# Patient Record
Sex: Male | Born: 1965
Health system: Southern US, Community
[De-identification: ages and names within clinical notes are randomized; demographics above are authoritative.]

## PROBLEM LIST (undated history)

## (undated) DIAGNOSIS — M2550 Pain in unspecified joint: Secondary | ICD-10-CM

## (undated) DIAGNOSIS — Z5189 Encounter for other specified aftercare: Secondary | ICD-10-CM

## (undated) DIAGNOSIS — M109 Gout, unspecified: Secondary | ICD-10-CM

## (undated) DIAGNOSIS — H409 Unspecified glaucoma: Secondary | ICD-10-CM

## (undated) DIAGNOSIS — F17201 Nicotine dependence, unspecified, in remission: Secondary | ICD-10-CM

## (undated) DIAGNOSIS — K449 Diaphragmatic hernia without obstruction or gangrene: Secondary | ICD-10-CM

## (undated) DIAGNOSIS — G8929 Other chronic pain: Secondary | ICD-10-CM

## (undated) DIAGNOSIS — F1011 Alcohol abuse, in remission: Secondary | ICD-10-CM

## (undated) DIAGNOSIS — K284 Chronic or unspecified gastrojejunal ulcer with hemorrhage: Secondary | ICD-10-CM

## (undated) DIAGNOSIS — M1990 Unspecified osteoarthritis, unspecified site: Secondary | ICD-10-CM

## (undated) DIAGNOSIS — K274 Chronic or unspecified peptic ulcer, site unspecified, with hemorrhage: Secondary | ICD-10-CM

## (undated) DIAGNOSIS — S83209A Unspecified tear of unspecified meniscus, current injury, unspecified knee, initial encounter: Secondary | ICD-10-CM

## (undated) DIAGNOSIS — I10 Essential (primary) hypertension: Secondary | ICD-10-CM

## (undated) HISTORY — PX: HERNIA REPAIR: SHX51

## (undated) HISTORY — DX: Diaphragmatic hernia without obstruction or gangrene: K44.9

## (undated) HISTORY — DX: Unspecified osteoarthritis, unspecified site: M19.90

## (undated) HISTORY — DX: Chronic or unspecified peptic ulcer, site unspecified, with hemorrhage: K27.4

## (undated) HISTORY — DX: Encounter for other specified aftercare: Z51.89

## (undated) HISTORY — DX: Nicotine dependence, unspecified, in remission: F17.201

## (undated) HISTORY — DX: Chronic or unspecified gastrojejunal ulcer with hemorrhage: K28.4

## (undated) HISTORY — DX: Unspecified glaucoma: H40.9

## (undated) HISTORY — DX: Gout, unspecified: M10.9

## (undated) HISTORY — DX: Alcohol abuse, in remission: F10.11

## (undated) HISTORY — PX: APPENDECTOMY: SHX54

## (undated) HISTORY — DX: Unspecified tear of unspecified meniscus, current injury, unspecified knee, initial encounter: S83.209A

---

## 2009-01-19 ENCOUNTER — Ambulatory Visit: Payer: Self-pay | Admitting: Family Medicine

## 2009-01-19 DIAGNOSIS — R5383 Other fatigue: Secondary | ICD-10-CM

## 2009-01-19 DIAGNOSIS — R5381 Other malaise: Secondary | ICD-10-CM | POA: Insufficient documentation

## 2009-01-19 DIAGNOSIS — R1310 Dysphagia, unspecified: Secondary | ICD-10-CM | POA: Insufficient documentation

## 2009-01-19 DIAGNOSIS — M109 Gout, unspecified: Secondary | ICD-10-CM | POA: Insufficient documentation

## 2009-01-19 LAB — CONVERTED CEMR LAB
AST: 32 units/L (ref 0–37)
Albumin: 4.2 g/dL (ref 3.5–5.2)
BUN: 16 mg/dL (ref 6–23)
Basophils Absolute: 0 10*3/uL (ref 0.0–0.1)
Bilirubin, Direct: 0.1 mg/dL (ref 0.0–0.3)
Calcium: 9.4 mg/dL (ref 8.4–10.5)
Chloride: 96 meq/L (ref 96–112)
Cholesterol, target level: 200 mg/dL
Creatinine, Ser: 1.1 mg/dL (ref 0.4–1.5)
Direct LDL: 145.2 mg/dL
Eosinophils Absolute: 0.1 10*3/uL (ref 0.0–0.7)
Ferritin: 56.4 ng/mL (ref 22.0–322.0)
Glucose, Bld: 98 mg/dL (ref 70–99)
HDL goal, serum: 40 mg/dL
Hemoglobin: 15.5 g/dL (ref 13.0–17.0)
LDL Goal: 130 mg/dL
MCHC: 35.6 g/dL (ref 30.0–36.0)
MCV: 93.7 fL (ref 78.0–100.0)
PSA: 1.83 ng/mL (ref 0.10–4.00)
RBC: 4.66 M/uL (ref 4.22–5.81)
Sodium: 141 meq/L (ref 135–145)
Total CHOL/HDL Ratio: 5.6
VLDL: 32 mg/dL (ref 0–40)
WBC: 9.5 10*3/uL (ref 4.5–10.5)

## 2009-01-20 ENCOUNTER — Encounter (INDEPENDENT_AMBULATORY_CARE_PROVIDER_SITE_OTHER): Payer: Self-pay | Admitting: *Deleted

## 2009-02-03 ENCOUNTER — Ambulatory Visit: Payer: Self-pay | Admitting: Gastroenterology

## 2009-02-24 ENCOUNTER — Ambulatory Visit: Payer: Self-pay | Admitting: Gastroenterology

## 2009-02-24 ENCOUNTER — Encounter: Payer: Self-pay | Admitting: Gastroenterology

## 2009-02-25 ENCOUNTER — Encounter: Payer: Self-pay | Admitting: Gastroenterology

## 2009-03-08 ENCOUNTER — Telehealth: Payer: Self-pay | Admitting: Gastroenterology

## 2009-04-12 ENCOUNTER — Ambulatory Visit: Payer: Self-pay | Admitting: Gastroenterology

## 2010-02-08 ENCOUNTER — Emergency Department (HOSPITAL_COMMUNITY): Admission: EM | Admit: 2010-02-08 | Discharge: 2010-02-08 | Payer: Self-pay | Admitting: Emergency Medicine

## 2010-02-08 ENCOUNTER — Emergency Department (HOSPITAL_COMMUNITY): Admission: EM | Admit: 2010-02-08 | Discharge: 2010-02-08 | Payer: Self-pay | Admitting: Family Medicine

## 2011-04-19 ENCOUNTER — Inpatient Hospital Stay (INDEPENDENT_AMBULATORY_CARE_PROVIDER_SITE_OTHER)
Admission: RE | Admit: 2011-04-19 | Discharge: 2011-04-19 | Disposition: A | Discharge status: Home / Self Care | Payer: 59 | Source: Ambulatory Visit | Attending: Family Medicine | Admitting: Family Medicine

## 2011-04-19 DIAGNOSIS — K612 Anorectal abscess: Secondary | ICD-10-CM

## 2011-04-22 LAB — CULTURE, ROUTINE-ABSCESS: Gram Stain: NONE SEEN

## 2012-01-11 ENCOUNTER — Encounter: Payer: Self-pay | Admitting: Family Medicine

## 2012-01-12 ENCOUNTER — Ambulatory Visit (INDEPENDENT_AMBULATORY_CARE_PROVIDER_SITE_OTHER): Payer: 59 | Admitting: Family Medicine

## 2012-01-12 ENCOUNTER — Encounter: Payer: Self-pay | Admitting: Family Medicine

## 2012-01-12 VITALS — BP 126/82 | HR 84 | Temp 98.4°F | Ht 70.0 in | Wt 264.2 lb

## 2012-01-12 DIAGNOSIS — M109 Gout, unspecified: Secondary | ICD-10-CM

## 2012-01-12 LAB — CBC WITH DIFFERENTIAL/PLATELET
Basophils Relative: 0.2 % (ref 0.0–3.0)
Eosinophils Absolute: 0.1 10*3/uL (ref 0.0–0.7)
HCT: 41.6 % (ref 39.0–52.0)
Hemoglobin: 14.3 g/dL (ref 13.0–17.0)
Lymphocytes Relative: 16.5 % (ref 12.0–46.0)
MCV: 91.3 fl (ref 78.0–100.0)
Monocytes Absolute: 0.9 10*3/uL (ref 0.1–1.0)
Platelets: 243 10*3/uL (ref 150.0–400.0)
RDW: 14.2 % (ref 11.5–14.6)

## 2012-01-12 LAB — BASIC METABOLIC PANEL
BUN: 13 mg/dL (ref 6–23)
Creatinine, Ser: 1.1 mg/dL (ref 0.4–1.5)
Sodium: 138 mEq/L (ref 135–145)

## 2012-01-12 MED ORDER — PREDNISONE 20 MG PO TABS: 20.0000 mg | ORAL_TABLET | Freq: Every day | ORAL | Status: DC

## 2012-01-12 MED ORDER — ALLOPURINOL 100 MG PO TABS: 100.0000 mg | ORAL_TABLET | Freq: Every day | ORAL | Status: DC

## 2012-01-12 MED ORDER — INDOMETHACIN 50 MG PO CAPS: 50.0000 mg | ORAL_CAPSULE | Freq: Three times a day (TID) | ORAL | Status: DC | PRN

## 2012-01-12 NOTE — Assessment & Plan Note (Signed)
Exam and story consistent with podagra Given h/o recurrent gout flares, start allopurinol at 100mg , return 1-2 mo for f/u with PCP. Check blood work today. Provided with gout handout.

## 2012-01-12 NOTE — Patient Instructions (Signed)
Looks like gout flare. Take prednisone 2 tablets daily for 5 days then start indocin as needed for gout pain (take daily for 1-2 weeks). Allopurinol 100mg  daily. Let us know if not improving or any worsening.  Gout Gout is an inflammatory condition (arthritis) caused by a buildup of uric acid crystals in the joints. Uric acid is a chemical that is normally present in the blood. Under some circumstances, uric acid can form into crystals in your joints. This causes joint redness, soreness, and swelling (inflammation). Repeat attacks are common. Over time, uric acid crystals can form into masses (tophi) near a joint, causing disfigurement. Gout is treatable and often preventable. CAUSES  The disease begins with elevated levels of uric acid in the blood. Uric acid is produced by your body when it breaks down a naturally found substance called purines. This also happens when you eat certain foods such as meats and fish. Causes of an elevated uric acid level include:  Being passed down from parent to child (heredity).   Diseases that cause increased uric acid production (obesity, psoriasis, some cancers).   Excessive alcohol use.   Diet, especially diets rich in meat and seafood.   Medicines, including certain cancer-fighting drugs (chemotherapy), diuretics, and aspirin.   Chronic kidney disease. The kidneys are no longer able to remove uric acid well.   Problems with metabolism.  Conditions strongly associated with gout include:  Obesity.   High blood pressure.   High cholesterol.   Diabetes.  Not everyone with elevated uric acid levels gets gout. It is not understood why some people get gout and others do not. Surgery, joint injury, and eating too much of certain foods are some of the factors that can lead to gout. SYMPTOMS   An attack of gout comes on quickly. It causes intense pain with redness, swelling, and warmth in a joint.   Fever can occur.   Often, only one joint is  involved. Certain joints are more commonly involved:   Base of the big toe.   Knee.   Ankle.   Wrist.   Finger.  Without treatment, an attack usually goes away in a few days to weeks. Between attacks, you usually will not have symptoms, which is different from many other forms of arthritis. DIAGNOSIS  Your caregiver will suspect gout based on your symptoms and exam. Removal of fluid from the joint (arthrocentesis) is done to check for uric acid crystals. Your caregiver will give you a medicine that numbs the area (local anesthetic) and use a needle to remove joint fluid for exam. Gout is confirmed when uric acid crystals are seen in joint fluid, using a special microscope. Sometimes, blood, urine, and X-ray tests are also used. TREATMENT  There are 2 phases to gout treatment: treating the sudden onset (acute) attack and preventing attacks (prophylaxis). Treatment of an Acute Attack  Medicines are used. These include anti-inflammatory medicines or steroid medicines.   An injection of steroid medicine into the affected joint is sometimes necessary.   The painful joint is rested. Movement can worsen the arthritis.   You may use warm or cold treatments on painful joints, depending which works best for you.   Discuss the use of coffee, vitamin C, or cherries with your caregiver. These may be helpful treatment options.  Treatment to Prevent Attacks After the acute attack subsides, your caregiver may advise prophylactic medicine. These medicines either help your kidneys eliminate uric acid from your body or decrease your uric acid production. You  may need to stay on these medicines for a very long time. The early phase of treatment with prophylactic medicine can be associated with an increase in acute gout attacks. For this reason, during the first few months of treatment, your caregiver may also advise you to take medicines usually used for acute gout treatment. Be sure you understand your  caregiver's directions. You should also discuss dietary treatment with your caregiver. Certain foods such as meats and fish can increase uric acid levels. Other foods such as dairy can decrease levels. Your caregiver can give you a list of foods to avoid. HOME CARE INSTRUCTIONS   Do not take aspirin to relieve pain. This raises uric acid levels.   Only take over-the-counter or prescription medicines for pain, discomfort, or fever as directed by your caregiver.   Rest the joint as much as possible. When in bed, keep sheets and blankets off painful areas.   Keep the affected joint raised (elevated).   Use crutches if the painful joint is in your leg.   Drink enough water and fluids to keep your urine clear or pale yellow. This helps your body get rid of uric acid. Do not drink alcoholic beverages. They slow the passage of uric acid.   Follow your caregiver's dietary instructions. Pay careful attention to the amount of protein you eat. Your daily diet should emphasize fruits, vegetables, whole grains, and fat-free or low-fat milk products.   Maintain a healthy body weight.  SEEK MEDICAL CARE IF:   You have an oral temperature above 102 F (38.9 C).   You develop diarrhea, vomiting, or any side effects from medicines.   You do not feel better in 24 hours, or you are getting worse.  SEEK IMMEDIATE MEDICAL CARE IF:   Your joint becomes suddenly more tender and you have:   Chills.   An oral temperature above 102 F (38.9 C), not controlled by medicine.  MAKE SURE YOU:   Understand these instructions.   Will watch your condition.   Will get help right away if you are not doing well or get worse.  Document Released: 11/17/2000 Document Revised: 08/02/2011 Document Reviewed: 02/28/2010 [employer] Patient Information 2012 [place], [place].

## 2012-01-12 NOTE — Progress Notes (Signed)
  Subjective:    Patient ID: Randy Combs, male    DOB: October 22, 1966, 46 y.o.   MRN: [id number]  HPI CC: gout flare  45yo with h/o gout presents with 2d h/o podagra.  Tends to control with indocin daily but has been out of indocin.  Last flare was several months ago.  Tends to get monthly but smaller flares.  Never on gout lowering meds.  No results found for this basename: URICACID   This current flare started 2 days ago, swollen medial foot and pain moving into ankle.  + redness, swelling, very tender to touch.  Last night was worse night.  No recent bug bites.  No fevers/chills, nausea.  Review of Systems Per HPI    Objective:   Physical Exam  Nursing note and vitals reviewed. Constitutional: He appears well-developed and well-nourished. No distress.  Cardiovascular:  Pulses:      Dorsalis pedis pulses are 2+ on the right side, and 2+ on the left side.       Posterior tibial pulses are 2+ on the right side, and 2+ on the left side.  Musculoskeletal:       L foot WNL  R foot - exquisitely tender to palpation 1st MTP as well as up medial foot into medial ankle.  Mild erythema just proximal to MTP.  + swelling present  Skin: Skin is warm and dry. No rash noted.  Psychiatric: He has a normal mood and affect.       Assessment & Plan:

## 2012-01-15 ENCOUNTER — Telehealth: Payer: Self-pay | Admitting: Family Medicine

## 2012-01-15 ENCOUNTER — Encounter: Payer: Self-pay | Admitting: Family Medicine

## 2012-01-15 ENCOUNTER — Ambulatory Visit (INDEPENDENT_AMBULATORY_CARE_PROVIDER_SITE_OTHER): Payer: 59 | Admitting: Family Medicine

## 2012-01-15 VITALS — BP 150/90 | HR 104 | Temp 97.6°F | Ht 70.0 in | Wt 264.4 lb

## 2012-01-15 DIAGNOSIS — M109 Gout, unspecified: Secondary | ICD-10-CM

## 2012-01-15 MED ORDER — COLCHICINE 0.6 MG PO TABS: ORAL_TABLET | ORAL | Status: DC

## 2012-01-15 NOTE — Telephone Encounter (Signed)
Triage Record Num: [id number] Operator: Vicky Varner °Patient Name: Randy Combs Call Date & Time: 01/15/2012 8:51:55AM °Patient Phone: [phone] PCP: Javier Gutierrez °Patient Gender: Male PCP Fax : [phone] °Patient DOB: 07/24/1966 Practice Name: [facility] °Reason for Call: °Caller: Randy Combs/Patient; PCP: Gutierrez, Javier; CB#: [phone]; ; ; Call regarding He °Was Told by Dr G. To Call Back If Medication for Gout Didn T Work and It Doesnt; °Pt had OV 01/12/2012 for gout flare up/ right foot - sx's started 01/10/2012 -- normally takes °Indomethacin, but Dr wanted him to finish Prednisine -- the swelling and reddness is worse °since OV - twice the size it normally is. Burning, tingling pain - relieves some with °elevation ,but cant bear wt due to pain. RN reached see in 4 hrs DISP for new marked °swelling per Foot Non injury protocol -- NO appts with Dr Gutierrez w/i 4 hrs -- Appt °sched for 1145am with Dr Copland. °Protocol(s) Used: Foot Non-Injury °Recommended Outcome per Protocol: See Provider within 4 hours °Reason for Outcome: New marked swelling (twice the normal size as °compared to usual appearance) °Care Advice: °~ Another adult should drive. °~ Call provider if symptoms worsen or new symptoms develop. °~ Limit weight-bearing activity until evaluated by provider. °Avoid standing or sitting with legs dependent for more than 1-2 hours at a time. Change positions and move °extremities every hour. Do not cross legs. Avoid tight or restricting clothing. °~ °~ SYMPTOM / CONDITION MANAGEMENT °Position affected part so it is elevated at least 12 inches (30 cm) above level of heart to improve circulation and °decrease discomfort. °~ °01/15/2012 9:10:16AM Page 1 of 1 CAN_TriageRpt_V2 °

## 2012-01-15 NOTE — Patient Instructions (Signed)
Start Indomethacin (Indocin) three times a day  Stop the allopurinol. - When you are completely, 100% better, then you can start this at 3 times a day  Colcrys (colchicine) you can also use when you have a gout flare 1 tablet twice a day

## 2012-01-15 NOTE — Progress Notes (Signed)
  Patient Name: Randy Combs Date of Birth: 05-Apr-1966 Age: 46 y.o. Medical Record Number: [id number] Gender: male Date of Encounter: 01/15/2012  History of Present Illness:  Randy Combs is a 46 y.o. very pleasant male patient who presents with the following:  History of significant gout. The patient was seen on Friday, placed on prednisone, and he was actually started on allopurinol. He is nonambulatory and on crutches. He asked gotten worse over the last few day. More swelling. Redness. Pain in the MTP and midfoot.  Past Medical History, Surgical History, Social History, Family History, Problem List, Medications, and Allergies have been reviewed and updated if relevant.  Review of Systems:  GEN: No fevers, chills. Nontoxic. Primarily MSK c/o today. MSK: Detailed in the HPI GI: tolerating PO intake without difficulty Neuro: No numbness, parasthesias, or tingling associated. Otherwise the pertinent positives of the ROS are noted above.    Physical Examination: Filed Vitals:   01/15/12 1153  BP: 150/90  Pulse: 104  Temp: 97.6 F (36.4 C)  TempSrc: Oral  Height: 5\' 10"  (1.778 m)  Weight: 264 lb 6.4 oz (119.931 kg)  SpO2: 98%    Body mass index is 37.94 kg/(m^2).   GEN: WDWN, NAD, Non-toxic, Alert & Oriented x 3 HEENT: Atraumatic, Normocephalic.  Ears and Nose: No external deformity. NEURO: Normal gait.  PSYCH: Normally interactive. Conversant. Not depressed or anxious appearing.  Calm demeanor.   Right foot: Very tender first MTP with swelling in the first MTP and throughout the midfoot as well as around the second MTP. Minimal manipulation attempted secondary to acute pain. Assessment and Plan: 1. Gout flare  colchicine 0.6 MG tablet    Doing poorly. Suspect that his gout worse and because of starting allopurinol. Discontinue. Start indomethacin 3 times daily. Also given prescription for colchicine. Start allopurinol when 100% improved and better

## 2012-01-15 NOTE — Telephone Encounter (Signed)
Triage Record Num: [id number] Operator: Aundra Millet Patient Name: Randy Combs Call Date & Time: 01/15/2012 8:51:55AM Patient Phone: [phone] PCP: Eustaquio Boyden Patient Gender: Male PCP Fax : [phone] Patient DOB: [birth year] Practice Name: [facility] Reason for Call: Caller: Hasan/Patient; PCP: Eustaquio Boyden; CB#: [phone]; ; ; Call regarding He Was Told by Dr Reece Agar. To Call Back If Medication for Gout Didn T Work and It Doesnt; Pt had OV 01/12/2012 for gout flare up/ right foot - sx's started 01/10/2012 -- normally takes Indomethacin, but Dr wanted him to finish Prednisine -- the swelling and reddness is worse since OV - twice the size it normally is. Burning, tingling pain - relieves some with elevation ,but cant bear wt due to pain. RN reached see in 4 hrs DISP for new marked swelling per Foot Non injury protocol -- NO appts with Dr Sharen Hones w/i 4 hrs -- Appt sched for 1145am with Dr Patsy Lager. Protocol(s) Used: Foot Non-Injury Recommended Outcome per Protocol: See Provider within 4 hours Reason for Outcome: New marked swelling (twice the normal size as compared to usual appearance) Care Advice: ~ Another adult should drive. ~ Call provider if symptoms worsen or new symptoms develop. ~ Limit weight-bearing activity until evaluated by provider. Avoid standing or sitting with legs dependent for more than 1-2 hours at a time. Change positions and move extremities every hour. Do not cross legs. Avoid tight or restricting clothing. ~ ~ SYMPTOM / CONDITION MANAGEMENT Position affected part so it is elevated at least 12 inches (30 cm) above level of heart to improve circulation and decrease discomfort. ~ 01/15/2012 9:10:16AM Page 1 of 1 CAN_TriageRpt_V2

## 2012-02-21 ENCOUNTER — Other Ambulatory Visit: Payer: Self-pay | Admitting: *Deleted

## 2012-02-21 DIAGNOSIS — M109 Gout, unspecified: Secondary | ICD-10-CM

## 2012-02-21 MED ORDER — INDOMETHACIN 50 MG PO CAPS: 50.0000 mg | ORAL_CAPSULE | Freq: Three times a day (TID) | ORAL | Status: DC | PRN

## 2012-05-09 ENCOUNTER — Other Ambulatory Visit: Payer: Self-pay | Admitting: *Deleted

## 2012-05-09 DIAGNOSIS — M109 Gout, unspecified: Secondary | ICD-10-CM

## 2012-05-09 MED ORDER — INDOMETHACIN 50 MG PO CAPS: 50.0000 mg | ORAL_CAPSULE | Freq: Three times a day (TID) | ORAL | Status: DC | PRN

## 2012-07-04 ENCOUNTER — Encounter (HOSPITAL_COMMUNITY): Payer: Self-pay

## 2012-07-04 ENCOUNTER — Emergency Department (INDEPENDENT_AMBULATORY_CARE_PROVIDER_SITE_OTHER): Payer: 59

## 2012-07-04 ENCOUNTER — Emergency Department (HOSPITAL_COMMUNITY)
Admission: EM | Admit: 2012-07-04 | Discharge: 2012-07-04 | Disposition: A | Discharge status: Home / Self Care | Payer: 59 | Source: Home / Self Care

## 2012-07-04 DIAGNOSIS — S91309A Unspecified open wound, unspecified foot, initial encounter: Secondary | ICD-10-CM

## 2012-07-04 DIAGNOSIS — S91339A Puncture wound without foreign body, unspecified foot, initial encounter: Secondary | ICD-10-CM

## 2012-07-04 MED ORDER — CIPROFLOXACIN HCL 500 MG PO TABS: 500.0000 mg | ORAL_TABLET | Freq: Two times a day (BID) | ORAL | Status: AC

## 2012-07-04 MED ORDER — CEFTRIAXONE SODIUM 250 MG IJ SOLR
INTRAMUSCULAR | Status: AC
  Filled 2012-07-04: qty 250

## 2012-07-04 MED ORDER — TETANUS-DIPHTH-ACELL PERTUSSIS 5-2.5-18.5 LF-MCG/0.5 IM SUSP
0.5000 mL | Freq: Once | INTRAMUSCULAR | Status: DC
  Administered 2012-07-04: 0.5 mL via INTRAMUSCULAR

## 2012-07-04 MED ORDER — TETANUS-DIPHTH-ACELL PERTUSSIS 5-2.5-18.5 LF-MCG/0.5 IM SUSP
INTRAMUSCULAR | Status: AC
  Filled 2012-07-04: qty 0.5

## 2012-07-04 MED ORDER — LIDOCAINE HCL (PF) 1 % IJ SOLN
INTRAMUSCULAR | Status: AC
  Filled 2012-07-04: qty 5

## 2012-07-04 MED ORDER — CEFTRIAXONE SODIUM 250 MG IJ SOLR
250.0000 mg | Freq: Once | INTRAMUSCULAR | Status: AC
  Administered 2012-07-04: 250 mg via INTRAMUSCULAR

## 2012-07-04 NOTE — ED Provider Notes (Signed)
Randy Combs is a 46 y.o. male who presents to Urgent Care today for puncture wound with tenderness and swelling of the right lateral foot.  Patient stepped on a rusty nail today.  The nail went through his work shoe and into his foot.  He quickly removed the nail.  He continued to work all day and went to the clinic today because his wife made him.  He has pain and tenderness on the lateral and plantar aspect of his right foot.  He denies any fevers or chills.  He denies diabetes or any immunocompromise state.  Additionally denies any allergies to antibiotics.   PMH reviewed. Significant for gout History  Substance Use Topics  . Smoking status: Current Everyday Smoker    Last Attempt to Quit: 12/04/2008  . Smokeless tobacco: Not on file  . Alcohol Use: No     Quit in 2008   ROS as above Medications reviewed. Current Facility-Administered Medications  Medication Dose Route Frequency Provider Last Rate Last Dose  . cefTRIAXone (ROCEPHIN) injection 250 mg  250 mg Intramuscular Once Rodolph Bong, MD   250 mg at 07/04/12 1940  . DISCONTD: TDaP (BOOSTRIX) injection 0.5 mL  0.5 mL Intramuscular Once Rodolph Bong, MD   0.5 mL at 07/04/12 1923   Current Outpatient Prescriptions  Medication Sig Dispense Refill  . indomethacin (INDOCIN) 50 MG capsule Take 1 capsule (50 mg total) by mouth 3 (three) times daily as needed (gout pain).  60 capsule  0  . allopurinol (ZYLOPRIM) 100 MG tablet Take 1 tablet (100 mg total) by mouth daily.  30 tablet  6  . ciprofloxacin (CIPRO) 500 MG tablet Take 1 tablet (500 mg total) by mouth 2 (two) times daily.  14 tablet  0  . colchicine 0.6 MG tablet 1 po bid prn gout flare pain  60 tablet  5    Exam:  BP 127/76  Pulse 84  Temp 98.2 F (36.8 C) (Oral)  Resp 16  SpO2 98% Gen: Well NAD RIGHT FOOT: Puncture wound under the head of the fifth metatarsal.  Mild erythema on the lateral aspect of the foot to the midshaft of the fifth metatarsal.  Tender to touch  . Sensation and capillary refill intact distally pulses are 2+ in dorsal pedis.  X-rays independently reviewed I agree with the findings below Dg Foot Complete Right  07/04/2012  *RADIOLOGY REPORT*  Clinical Data: Puncture wound of the fifth metatarsal.  Stepped on nail.  Entry wound marked with a metallic BB.  Marks entry site.  RIGHT FOOT COMPLETE - 3+ VIEW  Comparison: None.  Findings: There is no evidence for acute fracture or dislocation. No soft tissue foreign body or gas identified.  There is mild soft tissue swelling along the plantar aspect of the foot in the region of the metatarsal heads.  IMPRESSION:  1.  Mild soft tissue swelling. 2. No evidence for acute osseous abnormality.  Original Report Authenticated By: Patterson Hammersmith, M.D.    Assessment and Plan: 46 y.o. male with puncture wound with possible cellulitis.  Additionally patient is unsure of tetanus status.  Plan 1) give tetanus booster today 2) give IM injection of ceftriaxone. 3) oral Cipro for the next week this should work against Pseudomonas and other anaerobes commonly found in the shoe.   Followup if not improved.  Discussed warning signs or symptoms. Please see discharge instructions. Patient expresses understanding.      Rodolph Bong, MD 07/04/12 [phone]

## 2012-07-04 NOTE — ED Notes (Addendum)
Pt states he stepped on nail with shoes on this am.  Puncture wound noted to plantar surface rt foot.  Last tetanus was 5-8 years ago.

## 2012-07-06 NOTE — ED Provider Notes (Signed)
Medical screening examination/treatment/procedure(s) were performed by Resident-physician practitioner and as supervising physician I was immediately available for consultation/collaboration.  Raynald Blend, MD 07/06/12 1041

## 2012-09-09 ENCOUNTER — Ambulatory Visit (INDEPENDENT_AMBULATORY_CARE_PROVIDER_SITE_OTHER): Payer: 59 | Admitting: Family Medicine

## 2012-09-09 ENCOUNTER — Encounter: Payer: Self-pay | Admitting: Family Medicine

## 2012-09-09 VITALS — BP 114/74 | HR 76 | Temp 98.2°F | Ht 70.0 in | Wt 261.0 lb

## 2012-09-09 DIAGNOSIS — M109 Gout, unspecified: Secondary | ICD-10-CM

## 2012-09-09 MED ORDER — INDOMETHACIN 50 MG PO CAPS: 50.0000 mg | ORAL_CAPSULE | Freq: Three times a day (TID) | ORAL | Status: DC | PRN

## 2012-09-09 MED ORDER — ALLOPURINOL 100 MG PO TABS: ORAL_TABLET | ORAL | Status: DC

## 2012-09-09 NOTE — Progress Notes (Signed)
[facility] [address] [place] [address] Phone: [phone] Fax: [phone]  Date:  09/09/2012   Name:  Randy Combs   DOB:  [birth year]   MRN:  [id number] Gender: male Age: 46 y.o.  PCP:  Hannah Beat, MD    Chief Complaint: Referral   History of Present Illness:  Randy Combs is a 46 y.o. pleasant patient who presents with the following:  Patient presents with a history of poorly controlled gout, where he was started on allopurinol a few months ago, but he d/c. Took when he was still in the middle of a flare. Started to get some pain in L toe on sat, but took some indomethacin.  Patient Active Problem List  Diagnosis  . GOUT  . FATIGUE  . DYSPHAGIA UNSPECIFIED    Past Medical History  Diagnosis Date  . Alcohol abuse, in remission     quit in 2008  . Gout   . Torn meniscus   . Tobacco abuse, in remission     Quit 2010--Dipped tin/day x 25 years    Past Surgical History  Procedure Date  . Appendectomy 1970's    History  Substance Use Topics  . Smoking status: Current Every Day Smoker -- 20 years    Types: Cigars    Last Attempt to Quit: 12/04/2008  . Smokeless tobacco: Current User  . Alcohol Use: No     Quit in 2008    Family History  Problem Relation Age of Onset  . Lung cancer Mother   . Healthy Father   . Healthy Child   . Healthy Child   . Colon cancer Neg Hx   . Esophageal cancer Neg Hx     No Known Allergies  Medication list has been reviewed and updated.  Outpatient Prescriptions Prior to Visit  Medication Sig Dispense Refill  . indomethacin (INDOCIN) 50 MG capsule Take 1 capsule (50 mg total) by mouth 3 (three) times daily as needed (gout pain).  60 capsule  0  . colchicine 0.6 MG tablet 1 po bid prn gout flare pain  60 tablet  5  . allopurinol (ZYLOPRIM) 100 MG tablet Take 1 tablet (100 mg total) by mouth daily.  30 tablet  6    Review of Systems:   GEN: No fevers, chills. Nontoxic.  Primarily MSK c/o today. MSK: Detailed in the HPI GI: tolerating PO intake without difficulty Neuro: No numbness, parasthesias, or tingling associated. Otherwise the pertinent positives of the ROS are noted above.    Physical Examination: Filed Vitals:   09/09/12 0811  BP: 114/74  Pulse: 76  Temp: 98.2 F (36.8 C)   Filed Vitals:   09/09/12 0811  Height: 5\' 10"  (1.778 m)  Weight: 261 lb (118.389 kg)   Body mass index is 37.45 kg/(m^2). Ideal Body Weight: Weight in (lb) to have BMI = 25: 173.9    GEN: WDWN, NAD, Non-toxic, Alert & Oriented x 3 HEENT: Atraumatic, Normocephalic.  Ears and Nose: No external deformity. EXTR: No clubbing/cyanosis/edema NEURO: Normal gait.  PSYCH: Normally interactive. Conversant. Not depressed or anxious appearing.  Calm demeanor.  MSK: L MTP, somewhat limited in motion, painful, but not red or warm. Very mildly boggy MCP's  Assessment and Plan:  1. Podagra  allopurinol (ZYLOPRIM) 100 MG tablet, indomethacin (INDOCIN) 50 MG capsule  2. GOUT     Acute indocin regiment, then start allopurinol and titrate up to 200 mg po bid  Orders Today:  No  orders of the defined types were placed in this encounter.    Updated Medication List: (Includes new medications, updates to list, dose adjustments) Meds ordered this encounter  Medications  . allopurinol (ZYLOPRIM) 100 MG tablet    Sig: Begin 1 tab po daily for 1 week, then 1 po bid for 1 week, then 1 in AM and 2 in PM for 1 week, then 2 po bid    Dispense:  120 tablet    Refill:  1  . indomethacin (INDOCIN) 50 MG capsule    Sig: Take 1 capsule (50 mg total) by mouth 3 (three) times daily as needed (gout pain).    Dispense:  60 capsule    Refill:  0    Medications Discontinued: Medications Discontinued During This Encounter  Medication Reason  . allopurinol (ZYLOPRIM) 100 MG tablet Reorder  . indomethacin (INDOCIN) 50 MG capsule Reorder     Hannah Beat, MD

## 2012-09-09 NOTE — Patient Instructions (Addendum)
THIS WEEK:  1 Indomethacin three times a day through Thursday.  Then START: Allopurinol 1 each morning for 1 week Next week: 1 in the morning and 1 in the evening Following week: 1 in the morning and 2 in the evening Following week: 2 in the morning and 2 in the evening  F/u in 5-6 weeks    Gout Gout is an inflammatory condition (arthritis) caused by a buildup of uric acid crystals in the joints. Uric acid is a chemical that is normally present in the blood. Under some circumstances, uric acid can form into crystals in your joints. This causes joint redness, soreness, and swelling (inflammation). Repeat attacks are common. Over time, uric acid crystals can form into masses (tophi) near a joint, causing disfigurement. Gout is treatable and often preventable. CAUSES  The disease begins with elevated levels of uric acid in the blood. Uric acid is produced by your body when it breaks down a naturally found substance called purines. This also happens when you eat certain foods such as meats and fish. Causes of an elevated uric acid level include:  Being passed down from parent to child (heredity).  Diseases that cause increased uric acid production (obesity, psoriasis, some cancers).  Excessive alcohol use.  Diet, especially diets rich in meat and seafood.  Medicines, including certain cancer-fighting drugs (chemotherapy), diuretics, and aspirin.  Chronic kidney disease. The kidneys are no longer able to remove uric acid well.  Problems with metabolism. Conditions strongly associated with gout include:  Obesity.  High blood pressure.  High cholesterol.  Diabetes. Not everyone with elevated uric acid levels gets gout. It is not understood why some people get gout and others do not. Surgery, joint injury, and eating too much of certain foods are some of the factors that can lead to gout. SYMPTOMS   An attack of gout comes on quickly. It causes intense pain with redness, swelling,  and warmth in a joint.  Fever can occur.  Often, only one joint is involved. Certain joints are more commonly involved:  Base of the big toe.  Knee.  Ankle.  Wrist.  Finger. Without treatment, an attack usually goes away in a few days to weeks. Between attacks, you usually will not have symptoms, which is different from many other forms of arthritis. DIAGNOSIS  Your caregiver will suspect gout based on your symptoms and exam. Removal of fluid from the joint (arthrocentesis) is done to check for uric acid crystals. Your caregiver will give you a medicine that numbs the area (local anesthetic) and use a needle to remove joint fluid for exam. Gout is confirmed when uric acid crystals are seen in joint fluid, using a special microscope. Sometimes, blood, urine, and X-ray tests are also used. TREATMENT  There are 2 phases to gout treatment: treating the sudden onset (acute) attack and preventing attacks (prophylaxis). Treatment of an Acute Attack  Medicines are used. These include anti-inflammatory medicines or steroid medicines.  An injection of steroid medicine into the affected joint is sometimes necessary.  The painful joint is rested. Movement can worsen the arthritis.  You may use warm or cold treatments on painful joints, depending which works best for you.  Discuss the use of coffee, vitamin C, or cherries with your caregiver. These may be helpful treatment options. Treatment to Prevent Attacks After the acute attack subsides, your caregiver may advise prophylactic medicine. These medicines either help your kidneys eliminate uric acid from your body or decrease your uric acid production. You  may need to stay on these medicines for a very long time. The early phase of treatment with prophylactic medicine can be associated with an increase in acute gout attacks. For this reason, during the first few months of treatment, your caregiver may also advise you to take medicines usually  used for acute gout treatment. Be sure you understand your caregiver's directions. You should also discuss dietary treatment with your caregiver. Certain foods such as meats and fish can increase uric acid levels. Other foods such as dairy can decrease levels. Your caregiver can give you a list of foods to avoid. HOME CARE INSTRUCTIONS   Do not take aspirin to relieve pain. This raises uric acid levels.  Only take over-the-counter or prescription medicines for pain, discomfort, or fever as directed by your caregiver.  Rest the joint as much as possible. When in bed, keep sheets and blankets off painful areas.  Keep the affected joint raised (elevated).  Use crutches if the painful joint is in your leg.  Drink enough water and fluids to keep your urine clear or pale yellow. This helps your body get rid of uric acid. Do not drink alcoholic beverages. They slow the passage of uric acid.  Follow your caregiver's dietary instructions. Pay careful attention to the amount of protein you eat. Your daily diet should emphasize fruits, vegetables, whole grains, and fat-free or low-fat milk products.  Maintain a healthy body weight. SEEK MEDICAL CARE IF:   You have an oral temperature above 102 F (38.9 C).  You develop diarrhea, vomiting, or any side effects from medicines.  You do not feel better in 24 hours, or you are getting worse. SEEK IMMEDIATE MEDICAL CARE IF:   Your joint becomes suddenly more tender and you have:  Chills.  An oral temperature above 102 F (38.9 C), not controlled by medicine. MAKE SURE YOU:   Understand these instructions.  Will watch your condition.  Will get help right away if you are not doing well or get worse. Document Released: 11/17/2000 Document Revised: 02/12/2012 Document Reviewed: 02/28/2010 [facility] Patient Information 2013 [place], [place].

## 2014-03-04 ENCOUNTER — Ambulatory Visit (INDEPENDENT_AMBULATORY_CARE_PROVIDER_SITE_OTHER): Payer: 59 | Admitting: Family Medicine

## 2014-03-04 ENCOUNTER — Encounter: Payer: Self-pay | Admitting: Family Medicine

## 2014-03-04 ENCOUNTER — Telehealth: Payer: Self-pay | Admitting: Family Medicine

## 2014-03-04 VITALS — BP 142/86 | HR 60 | Temp 98.2°F | Ht 69.0 in | Wt 270.5 lb

## 2014-03-04 DIAGNOSIS — Z131 Encounter for screening for diabetes mellitus: Secondary | ICD-10-CM

## 2014-03-04 DIAGNOSIS — Z13220 Encounter for screening for lipoid disorders: Secondary | ICD-10-CM

## 2014-03-04 DIAGNOSIS — M109 Gout, unspecified: Secondary | ICD-10-CM

## 2014-03-04 DIAGNOSIS — I10 Essential (primary) hypertension: Secondary | ICD-10-CM

## 2014-03-04 DIAGNOSIS — R5383 Other fatigue: Secondary | ICD-10-CM

## 2014-03-04 DIAGNOSIS — R5381 Other malaise: Secondary | ICD-10-CM

## 2014-03-04 MED ORDER — INDOMETHACIN 50 MG PO CAPS: 50.0000 mg | ORAL_CAPSULE | Freq: Three times a day (TID) | ORAL | Status: DC | PRN

## 2014-03-04 MED ORDER — LISINOPRIL 20 MG PO TABS: 20.0000 mg | ORAL_TABLET | Freq: Every day | ORAL | Status: DC

## 2014-03-04 NOTE — Progress Notes (Signed)
Date:  03/04/2014   Name:  Randy Combs   DOB:  26-Jun-1966   MRN:  [id number]  Primary Physician:  Owens Loffler, MD   Chief Complaint: Hypertension   Subjective:   History of Present Illness:  Randy Combs is a 48 y.o. pleasant patient who presents with the following:  New onset HTN: 173/98.  145/96 Job interview physical and BP was 173/98, on my recheck 145/96 today. Has had some headaches.  Body mass index is 39.93 kg/(m^2).   Gout, stable, needs indocin refill  Patient Active Problem List   Diagnosis Date Noted  . Unspecified essential hypertension 03/04/2014  . GOUT 01/19/2009  . FATIGUE 01/19/2009  . DYSPHAGIA UNSPECIFIED 01/19/2009    Past Medical History  Diagnosis Date  . Alcohol abuse, in remission     quit in 2008  . Gout   . Torn meniscus   . Tobacco abuse, in remission     Quit 2010--Dipped tin/day x 25 years    Past Surgical History  Procedure Laterality Date  . Appendectomy  1970's    History   Social History  . Marital Status: Married    Spouse Name: N/A    Number of Children: N/A  . Years of Education: N/A   Occupational History  . Not on file.   Social History Main Topics  . Smoking status: Former Smoker -- 20 years    Types: Cigars    Quit date: 12/04/2008  . Smokeless tobacco: Current User  . Alcohol Use: No     Comment: Quit in 2008  . Drug Use: No  . Sexual Activity: Not Currently   Other Topics Concern  . Not on file   Social History Narrative   Grading work (runs equipment)      Married      Regular exercise      Quit alcohol;tobacco      No drugs    Family History  Problem Relation Age of Onset  . Lung cancer Mother   . Healthy Father   . Healthy Child   . Healthy Child   . Colon cancer Neg Hx   . Esophageal cancer Neg Hx     No Known Allergies  Medication list has been reviewed and updated.  Review of Systems:   GEN: No acute illnesses, no fevers, chills. GI: No n/v/d, eating  normally Pulm: No SOB Interactive and getting along well at home.  Otherwise, ROS is as per the HPI.  Objective:   Physical Examination: BP 142/86  Pulse 60  Temp(Src) 98.2 F (36.8 C) (Oral)  Ht 5\' 9"  (1.753 m)  Wt 270 lb 8 oz (122.698 kg)  BMI 39.93 kg/m2  Ideal Body Weight: Weight in (lb) to have BMI = 25: 168.9   GEN: WDWN, NAD, Non-toxic, A & O x 3 HEENT: Atraumatic, Normocephalic. Neck supple. No masses, No LAD. Ears and Nose: No external deformity. CV: RRR, No M/G/R. No JVD. No thrill. No extra heart sounds. PULM: CTA B, no wheezes, crackles, rhonchi. No retractions. No resp. distress. No accessory muscle use. EXTR: No c/c/e NEURO Normal gait.  PSYCH: Normally interactive. Conversant. Not depressed or anxious appearing.  Calm demeanor.   Laboratory and Imaging Data:  Assessment & Plan:   Unspecified essential hypertension  Podagra - Plan: indomethacin (INDOCIN) 50 MG capsule  Screening for lipoid disorders - Plan: Lipid panel  Screening for diabetes mellitus - Plan: Basic metabolic panel  Other malaise and fatigue - Plan: CBC with Differential,  Hepatic function panel  Start ace  Refill indocin  Return in about 1 week (around 03/11/2014).  Orders Placed This Encounter  Procedures  . Basic metabolic panel  . CBC with Differential  . Hepatic function panel  . Lipid panel   Patient's Medications  New Prescriptions   LISINOPRIL (PRINIVIL,ZESTRIL) 20 MG TABLET    Take 1 tablet (20 mg total) by mouth daily.  Previous Medications   No medications on file  Modified Medications   Modified Medication Previous Medication   INDOMETHACIN (INDOCIN) 50 MG CAPSULE indomethacin (INDOCIN) 50 MG capsule      Take 1 capsule (50 mg total) by mouth 3 (three) times daily as needed (gout pain).    Take 1 capsule (50 mg total) by mouth 3 (three) times daily as needed (gout pain).  Discontinued Medications   ALLOPURINOL (ZYLOPRIM) 100 MG TABLET    Begin 1 tab po daily for  1 week, then 1 po bid for 1 week, then 1 in AM and 2 in PM for 1 week, then 2 po bid   COLCHICINE 0.6 MG TABLET    1 po bid prn gout flare pain   There are no Patient Instructions on file for this visit.  Signed,  Randy Deed. Rashi Giuliani, MD, [place] at [facility] [place] [address] Phone: [phone] Fax: [phone]

## 2014-03-04 NOTE — Addendum Note (Signed)
Addended by: Ellamae Sia on: 03/04/2014 12:28 PM   Modules accepted: Orders

## 2014-03-04 NOTE — Telephone Encounter (Signed)
Relevant patient education mailed to patient.  

## 2014-03-04 NOTE — Progress Notes (Signed)
Pre visit review using our clinic review tool, if applicable. No additional management support is needed unless otherwise documented below in the visit note. 

## 2014-03-11 ENCOUNTER — Encounter: Payer: Self-pay | Admitting: Family Medicine

## 2014-03-11 ENCOUNTER — Ambulatory Visit (INDEPENDENT_AMBULATORY_CARE_PROVIDER_SITE_OTHER): Payer: 59 | Admitting: Family Medicine

## 2014-03-11 VITALS — BP 120/74 | HR 68 | Temp 98.1°F | Ht 69.0 in | Wt 274.0 lb

## 2014-03-11 DIAGNOSIS — I10 Essential (primary) hypertension: Secondary | ICD-10-CM

## 2014-03-11 NOTE — Progress Notes (Signed)
Pre visit review using our clinic review tool, if applicable. No additional management support is needed unless otherwise documented below in the visit note. 

## 2014-03-11 NOTE — Progress Notes (Signed)
   Date:  03/11/2014   Name:  Randy Combs   DOB:  [birth year]   MRN:  [id number]  Primary Physician:  Owens Loffler, MD   Chief Complaint: No chief complaint on file.   Subjective:   History of Present Illness:  Randy Combs is a 48 y.o. very pleasant male patient who presents with the following:  HTN: Tolerating all medications without side effects. (lisinopril 20 mg) Stable and at goal No CP, no sob. No HA.  BP Readings from Last 3 Encounters:  03/11/14 120/74  03/04/14 142/86  09/09/12 332/95    Basic Metabolic Panel:    Component Value Date/Time   NA 138 01/12/2012 0954   K 4.4 01/12/2012 0954   CL 101 01/12/2012 0954   CO2 30 01/12/2012 0954   BUN 13 01/12/2012 0954   CREATININE 1.1 01/12/2012 0954   GLUCOSE 100* 01/12/2012 0954   CALCIUM 10.0 01/12/2012 0954      Past Medical History, Surgical History, Social History, Family History, Problem List, Medications, and Allergies have been reviewed and updated if relevant.  Review of Systems:  GEN: No acute illnesses, no fevers, chills. GI: No n/v/d, eating normally Pulm: No SOB Interactive and getting along well at home.  Otherwise, ROS is as per the HPI.  Objective:   Physical Examination: BP 120/74  Pulse 68  Temp(Src) 98.1 F (36.7 C) (Oral)  Ht 5\' 9"  (1.753 m)  Wt 274 lb (124.286 kg)  BMI 40.44 kg/m2   GEN: WDWN, NAD, Non-toxic, A & O x 3 HEENT: Atraumatic, Normocephalic. Neck supple. No masses, No LAD. Ears and Nose: No external deformity. CV: RRR, No M/G/R. No JVD. No thrill. No extra heart sounds. PULM: CTA B, no wheezes, crackles, rhonchi. No retractions. No resp. distress. No accessory muscle use. EXTR: No c/c/e NEURO Normal gait.  PSYCH: Normally interactive. Conversant. Not depressed or anxious appearing.  Calm demeanor.   Laboratory and Imaging Data:  Assessment & Plan:   Unspecified essential hypertension  Stable, letter for work written  Follow-up: Return in about 1 year (around  03/12/2015).  New Prescriptions   No medications on file   No orders of the defined types were placed in this encounter.   There are no Patient Instructions on file for this visit.  Signed,  Maud Deed. Juliannah Ohmann, MD, [place] at [facility] [place] [address] Phone: [phone] Fax: [phone]  Patient's Medications  New Prescriptions   No medications on file  Previous Medications   INDOMETHACIN (INDOCIN) 50 MG CAPSULE    Take 1 capsule (50 mg total) by mouth 3 (three) times daily as needed (gout pain).   LISINOPRIL (PRINIVIL,ZESTRIL) 20 MG TABLET    Take 1 tablet (20 mg total) by mouth daily.  Modified Medications   No medications on file  Discontinued Medications   No medications on file

## 2014-11-12 ENCOUNTER — Emergency Department: Payer: Self-pay | Admitting: Emergency Medicine

## 2014-12-30 ENCOUNTER — Other Ambulatory Visit (HOSPITAL_COMMUNITY): Payer: Self-pay | Admitting: Orthopaedic Surgery

## 2014-12-30 DIAGNOSIS — M25562 Pain in left knee: Secondary | ICD-10-CM

## 2015-01-18 ENCOUNTER — Ambulatory Visit (HOSPITAL_COMMUNITY)
Admission: RE | Admit: 2015-01-18 | Discharge: 2015-01-18 | Disposition: A | Discharge status: Home / Self Care | Payer: 59 | Source: Ambulatory Visit | Attending: Orthopaedic Surgery | Admitting: Orthopaedic Surgery

## 2015-01-18 DIAGNOSIS — X58XXXA Exposure to other specified factors, initial encounter: Secondary | ICD-10-CM | POA: Insufficient documentation

## 2015-01-18 DIAGNOSIS — S83195A Other dislocation of left knee, initial encounter: Secondary | ICD-10-CM | POA: Insufficient documentation

## 2015-01-18 DIAGNOSIS — M25562 Pain in left knee: Secondary | ICD-10-CM

## 2015-01-18 DIAGNOSIS — Y939 Activity, unspecified: Secondary | ICD-10-CM | POA: Insufficient documentation

## 2015-01-21 ENCOUNTER — Other Ambulatory Visit (HOSPITAL_COMMUNITY): Payer: Self-pay | Admitting: Orthopaedic Surgery

## 2015-01-21 NOTE — Progress Notes (Signed)
Called for release to Epic sign and held surgery 01-28-15 pre op 01-26-15 Thanks

## 2015-01-25 NOTE — Patient Instructions (Signed)
Randy Combs  01/25/2015   Your procedure is scheduled on: 01/28/2015    Report to [facility] Main  Entrance and follow signs to               [place] at    0700 AM.  Call this number if you have problems the morning of surgery [phone]   Remember:  Do not eat food or drink liquids :After Midnight.     Take these medicines the morning of surgery with A SIP OF WATER:                                You may not have any metal on your body including hair pins and              piercings  Do not wear jewelry,  lotions, powders or perfumes., deodorant.                           Men may shave face and neck.   Do not bring valuables to the hospital. [place].  Contacts, dentures or bridgework may not be worn into surgery.       Patients discharged the day of surgery will not be allowed to drive home.  Name and phone number of your driver:  Special Instructions:coughing and deep breathing exercises, leg exercises               Please read over the following fact sheets you were given: _____________________________________________________________________             [facility] - Preparing for Surgery Before surgery, you can play an important role.  Because skin is not sterile, your skin needs to be as free of germs as possible.  You can reduce the number of germs on your skin by washing with CHG (chlorahexidine gluconate) soap before surgery.  CHG is an antiseptic cleaner which kills germs and bonds with the skin to continue killing germs even after washing. Please DO NOT use if you have an allergy to CHG or antibacterial soaps.  If your skin becomes reddened/irritated stop using the CHG and inform your nurse when you arrive at Short Stay. Do not shave (including legs and underarms) for at least 48 hours prior to the first CHG shower.  You may shave your face/neck. Please follow these  instructions carefully:  1.  Shower with CHG Soap the night before surgery and the  morning of Surgery.  2.  If you choose to wash your hair, wash your hair first as usual with your  normal  shampoo.  3.  After you shampoo, rinse your hair and body thoroughly to remove the  shampoo.                           4.  Use CHG as you would any other liquid soap.  You can apply chg directly  to the skin and wash                       Gently with a scrungie or clean washcloth.  5.  Apply the CHG Soap to your body  ONLY FROM THE NECK DOWN.   Do not use on face/ open                           Wound or open sores. Avoid contact with eyes, ears mouth and genitals (private parts).                       Wash face,  Genitals (private parts) with your normal soap.             6.  Wash thoroughly, paying special attention to the area where your surgery  will be performed.  7.  Thoroughly rinse your body with warm water from the neck down.  8.  DO NOT shower/wash with your normal soap after using and rinsing off  the CHG Soap.                9.  Pat yourself dry with a clean towel.            10.  Wear clean pajamas.            11.  Place clean sheets on your bed the night of your first shower and do not  sleep with pets. Day of Surgery : Do not apply any lotions/deodorants the morning of surgery.  Please wear clean clothes to the hospital/surgery center.  FAILURE TO FOLLOW THESE INSTRUCTIONS MAY RESULT IN THE CANCELLATION OF YOUR SURGERY PATIENT SIGNATURE_________________________________  NURSE SIGNATURE__________________________________  ________________________________________________________________________

## 2015-01-26 ENCOUNTER — Inpatient Hospital Stay (HOSPITAL_COMMUNITY)
Admission: RE | Admit: 2015-01-26 | Discharge: 2015-01-26 | Disposition: A | Discharge status: Home / Self Care | Payer: 59 | Source: Ambulatory Visit

## 2015-01-28 ENCOUNTER — Encounter (HOSPITAL_COMMUNITY): Admission: RE | Payer: Self-pay | Source: Ambulatory Visit

## 2015-01-28 ENCOUNTER — Ambulatory Visit (HOSPITAL_COMMUNITY): Admission: RE | Admit: 2015-01-28 | Payer: 59 | Source: Ambulatory Visit | Admitting: Orthopaedic Surgery

## 2015-01-28 SURGERY — ARTHROSCOPY, KNEE
Anesthesia: General | Laterality: Left

## 2015-02-07 ENCOUNTER — Encounter (HOSPITAL_COMMUNITY): Payer: Self-pay | Admitting: *Deleted

## 2015-02-07 ENCOUNTER — Emergency Department (HOSPITAL_COMMUNITY)
Admission: EM | Admit: 2015-02-07 | Discharge: 2015-02-08 | Disposition: A | Discharge status: Home / Self Care | Payer: 59 | Attending: Emergency Medicine | Admitting: Emergency Medicine

## 2015-02-07 ENCOUNTER — Emergency Department (HOSPITAL_COMMUNITY): Payer: 59

## 2015-02-07 DIAGNOSIS — R0789 Other chest pain: Secondary | ICD-10-CM | POA: Diagnosis not present

## 2015-02-07 DIAGNOSIS — Z720 Tobacco use: Secondary | ICD-10-CM | POA: Diagnosis not present

## 2015-02-07 DIAGNOSIS — R079 Chest pain, unspecified: Secondary | ICD-10-CM | POA: Diagnosis present

## 2015-02-07 DIAGNOSIS — Z79899 Other long term (current) drug therapy: Secondary | ICD-10-CM | POA: Diagnosis not present

## 2015-02-07 DIAGNOSIS — Z8739 Personal history of other diseases of the musculoskeletal system and connective tissue: Secondary | ICD-10-CM | POA: Diagnosis not present

## 2015-02-07 DIAGNOSIS — Z87828 Personal history of other (healed) physical injury and trauma: Secondary | ICD-10-CM | POA: Insufficient documentation

## 2015-02-07 LAB — BASIC METABOLIC PANEL
ANION GAP: 8 (ref 5–15)
BUN: 14 mg/dL (ref 6–23)
CO2: 22 mmol/L (ref 19–32)
CREATININE: 0.97 mg/dL (ref 0.50–1.35)
Calcium: 9 mg/dL (ref 8.4–10.5)
Chloride: 110 mmol/L (ref 96–112)
GFR calc Af Amer: >90 mL/min (ref >90)
GFR calc non Af Amer: >90 mL/min (ref >90)
Glucose, Bld: 152 mg/dL — ABNORMAL HIGH (ref 70–99)
POTASSIUM: 3.5 mmol/L (ref 3.5–5.1)
Sodium: 140 mmol/L (ref 135–145)

## 2015-02-07 LAB — BRAIN NATRIURETIC PEPTIDE: B Natriuretic Peptide: 95.7 pg/mL (ref 0.0–100.0)

## 2015-02-07 LAB — TROPONIN I

## 2015-02-07 NOTE — ED Notes (Signed)
When he walks he gets sob if he sits diown the pain and sob go away

## 2015-02-07 NOTE — ED Notes (Signed)
The pt uis c/o mid-chest pain with some exertional sob foir the past 2 weeks..  No hx of either

## 2015-02-07 NOTE — ED Provider Notes (Signed)
CSN: [id number]     Arrival date & time 02/07/15  1912 History   First MD Initiated Contact with Patient 02/07/15 2020     Chief Complaint  Patient presents with  . Chest Pain     (Consider location/radiation/quality/duration/timing/severity/associated sxs/prior Treatment) HPI Randy Combs is a 49 y.o. male who comes in for evaluation of shortness of breath. Patient says for the past 3 weeks he has noticed anytime he tries to walk or do any activity he becomes short of breath, weak and experiences a chest "pressure". He rates the chest pressure discomfort as an 8/10. The symptoms resolved when he sits down and rests. He has not had any pain today, comes in "because my wife made me". He works in [employer] and is usually very active. He does report being diagnosed with hypertension, but discontinued his blood pressure medication 3 months ago because he did not like it. He denies any associated nausea or vomiting, diaphoresis, radiating pain. No fevers, headache, changes in vision, abdominal pain, diarrhea or constipation, numbness or weakness, syncope, rash. Smokes 3 cigars per week  Past Medical History  Diagnosis Date  . Alcohol abuse, in remission     quit in 2008  . Gout   . Torn meniscus   . Tobacco abuse, in remission     Quit 2010--Dipped tin/day x 25 years   Past Surgical History  Procedure Laterality Date  . Appendectomy  1970's   Family History  Problem Relation Age of Onset  . Lung cancer Mother   . Healthy Father   . Healthy Child   . Healthy Child   . Colon cancer Neg Hx   . Esophageal cancer Neg Hx    History  Substance Use Topics  . Smoking status: Current Every Day Smoker -- 20 years    Types: Cigars    Last Attempt to Quit: 12/04/2008  . Smokeless tobacco: Current User  . Alcohol Use: No     Comment: Quit in 2008    Review of Systems  A 10 point review of systems was completed and was negative except for pertinent positives and negatives as  mentioned in the history of present illness    Allergies  Review of patient's allergies indicates no known allergies.  Home Medications   Prior to Admission medications   Medication Sig Start Date End Date Taking? Authorizing Provider  ibuprofen (ADVIL,MOTRIN) 200 MG tablet Take 400 mg by mouth every 8 (eight) hours as needed for headache or moderate pain.   Yes Historical Provider, MD  indomethacin (INDOCIN) 50 MG capsule Take 1 capsule (50 mg total) by mouth 3 (three) times daily as needed (gout pain). Patient taking differently: Take 50 mg by mouth daily as needed (gout pain).  03/04/14  Yes Spencer Copland, MD  lisinopril (PRINIVIL,ZESTRIL) 20 MG tablet Take 1 tablet (20 mg total) by mouth daily. Patient not taking: Reported on 01/22/2015 03/04/14   Owens Loffler, MD   BP 138/77 mmHg  Pulse 70  Temp(Src) 98.1 F (36.7 C) (Oral)  Resp 14  SpO2 100% Physical Exam  Constitutional: He is oriented to person, place, and time. He appears well-developed and well-nourished.  HENT:  Head: Normocephalic and atraumatic.  Mouth/Throat: Oropharynx is clear and moist.  Eyes: Conjunctivae are normal. Pupils are equal, round, and reactive to light. Right eye exhibits no discharge. Left eye exhibits no discharge. No scleral icterus.  Neck: Neck supple.  Cardiovascular: Normal rate, regular rhythm and normal heart sounds.   Pulmonary/Chest:  Effort normal.  Bilateral basilar rales. No evidence of respiratory distress. Chest expansion rises appropriately and equally with respiration.  Abdominal: Soft. There is no tenderness.  Musculoskeletal: He exhibits no edema or tenderness.  Neurological: He is alert and oriented to person, place, and time.  Cranial Nerves II-XII grossly intact. Moves all 4 extremities without ataxia.  Skin: Skin is warm and dry. No rash noted.  Psychiatric: He has a normal mood and affect.  Nursing note and vitals reviewed.   ED Course  Procedures (including critical care  time) Labs Review Labs Reviewed  BASIC METABOLIC PANEL - Abnormal; Notable for the following:    Glucose, Bld 152 (*)    All other components within normal limits  BRAIN NATRIURETIC PEPTIDE  TROPONIN I    Imaging Review Dg Chest 2 View  02/07/2015   CLINICAL DATA:  Mid chest pain with shortness of breath for the past 3 weeks.  EXAM: CHEST  2 VIEW  COMPARISON:  None.  FINDINGS: Normal heart size and pulmonary vascularity. No focal airspace disease or consolidation in the lungs. No blunting of costophrenic angles. No pneumothorax. Mediastinal contours appear intact. Esophageal hiatal hernia behind the heart. Degenerative changes in the spine.  IMPRESSION: No active cardiopulmonary disease.   Electronically Signed   By: Lucienne Capers M.D.   On: 02/07/2015 20:56     EKG Interpretation    Date: 02/08/2015  19:16  Rate: 118  Rhythm:sinus tach  QRS Axis: left  Intervals: normal  ST/T Wave abnormalities: normal  Conduction Disutrbances:right bundle branch block  Narrative Interpretation:   Old EKG Reviewed: none available  Date/Time:  Monday February 08 2015 00:04:24 EST Ventricular Rate:  72 PR Interval:  150 QRS Duration: 93 QT Interval:  403 QTC Calculation: 441 R Axis:   19 Text Interpretation:  Sinus rhythm Low voltage, precordial leads Abnormal  R-wave progression, early transition Borderline T abnormalities, inferior  leads Confirmed by HORTON  MD, COURTNEY ([id number]) on 02/08/2015 12:09:31 AM      Meds given in ED:  Medications - No data to display  New Prescriptions   No medications on file   Filed Vitals:   02/08/15 0030 02/08/15 0100 02/08/15 0130 02/08/15 0143  BP: 138/72 135/68 138/77 138/77  Pulse: 73 71 76 70  Temp:    98.1 F (36.7 C)  TempSrc:    Oral  Resp: 23 24 27 14   SpO2: 99% 99% 100% 100%    MDM  Vitals stable - WNL -afebrile, saturations 99% on room air Pt resting comfortably in ED. Denies any discomfort. Labwork noncontributory. Troponin  negative Imaging: chest x-ray shows no acute cardio pulmonary pathology Original EKG shows sinus tachycardia to 118 with a right bundle branch block that spontaneously returned to normal sinus rhythm at 72.  Consult cardiology, will see in ED. EKG likely rate dependent RBBB. Recommends outpatient stress test. Referral provided Patient stable, in good condition and is appropriate for discharge  Discussed patient presentation and ED course with attending, Dr. Eulis Foster, who also saw and evaluated the patient.   Final diagnoses:  Chest discomfort       Viona Gilmore [place], PA-C 02/08/15 0146  Daleen Bo, MD 02/08/15 1122

## 2015-02-08 ENCOUNTER — Telehealth: Payer: Self-pay | Admitting: Family Medicine

## 2015-02-08 NOTE — Discharge Instructions (Signed)
Exercise Stress Electrocardiogram An exercise stress electrocardiogram is a test that is done to evaluate the blood supply to your heart. This test may also be called exercise stress electrocardiography. The test is done while you are walking on a treadmill. The goal of this test is to raise your heart rate. This test is done to find areas of poor blood flow to the heart by determining the extent of coronary artery disease (CAD).   CAD is defined as narrowing in one or more heart (coronary) arteries of more than 70%. If you have an abnormal test result, this may mean that you are not getting adequate blood flow to your heart during exercise. Additional testing may be needed to understand why your test was abnormal. LET [facility] CARE PROVIDER KNOW ABOUT:   Any allergies you have.  All medicines you are taking, including vitamins, herbs, eye drops, creams, and over-the-counter medicines.  Previous problems you or members of your family have had with the use of anesthetics.  Any blood disorders you have.  Previous surgeries you have had.  Medical conditions you have.  Possibility of pregnancy, if this applies. RISKS AND COMPLICATIONS Generally, this is a safe procedure. However, as with any procedure, complications can occur. Possible complications can include:  Pain or pressure in the following areas:  Chest.  Jaw or neck.  Between your shoulder blades.  Radiating down your left arm.  Dizziness or light-headedness.  Shortness of breath.  Increased or irregular heartbeats.  Nausea or vomiting.  Heart attack (rare). BEFORE THE PROCEDURE  Avoid all forms of caffeine 24 hours before your test or as directed by your health care provider. This includes coffee, tea (even decaffeinated tea), caffeinated sodas, chocolate, cocoa, and certain pain medicines.  Follow your health care provider's instructions regarding eating and drinking before the test.  Take your medicines as  directed at regular times with water unless instructed otherwise. Exceptions may include:  If you have diabetes, ask how you are to take your insulin or pills. It is common to adjust insulin dosing the morning of the test.  If you are taking beta-blocker medicines, it is important to talk to your health care provider about these medicines well before the date of your test. Taking beta-blocker medicines may interfere with the test. In some cases, these medicines need to be changed or stopped 24 hours or more before the test.  If you wear a nitroglycerin patch, it may need to be removed prior to the test. Ask your health care provider if the patch should be removed before the test.  If you use an inhaler for any breathing condition, bring it with you to the test.  If you are an outpatient, bring a snack so you can eat right after the stress phase of the test.  Do not smoke for 4 hours prior to the test or as directed by your health care provider.  Do not apply lotions, powders, creams, or oils on your chest prior to the test.  Wear loose-fitting clothes and comfortable shoes for the test. This test involves walking on a treadmill. PROCEDURE  Multiple patches (electrodes) will be put on your chest. If needed, small areas of your chest may have to be shaved to get better contact with the electrodes. Once the electrodes are attached to your body, multiple wires will be attached to the electrodes and your heart rate will be monitored.  Your heart will be monitored both at rest and while exercising.  You  will walk on a treadmill. The treadmill will be started at a slow pace. The treadmill speed and incline will gradually be increased to raise your heart rate. AFTER THE PROCEDURE  Your heart rate and blood pressure will be monitored after the test.  You may return to your normal schedule including diet, activities, and medicines, unless your health care provider tells you otherwise. Document  Released: 11/17/2000 Document Revised: 11/25/2013 Document Reviewed: 07/28/2013 [facility] Patient Information 2015 [place], [place]. This information is not intended to replace advice given to you by your health care provider. Make sure you discuss any questions you have with your health care provider.  It is important for you to follow-up with [facility] in order to set up an outpatient stress test for further evaluation of your chest discomfort. Return to ED for new or worsening symptoms

## 2015-02-08 NOTE — Telephone Encounter (Signed)
That is fine 

## 2015-02-08 NOTE — Telephone Encounter (Signed)
i will have to work the patient in emergently on Wednesday. 30 minutes. Case reviewed.

## 2015-02-08 NOTE — Telephone Encounter (Signed)
I spoke with patient and he scheduled appointment on 02/10/15 at 3:15.

## 2015-02-08 NOTE — Telephone Encounter (Signed)
You don't have 30 minutes available on Wednesday. Do you want me to try and get him in at 3:15 and block your 4:00?

## 2015-02-08 NOTE — ED Provider Notes (Signed)
  Face-to-face evaluation   History: He is here for evaluation of chest pain and shortness of breath, which is intermittent and associated with exertion. Symptoms ongoing for 3 weeks, and worsening. He works a job as a Development worker, community. He does not have known cardiac disease.   Physical exam: Alert, calm, cooperative. At rest he is comfortable. There is no respiratory distress. Moves all extremities equally. No peripheral edema. Vital signs and pulse oxygenation are normal.  Medical screening examination/treatment/procedure(s) were conducted as a shared visit with non-physician practitioner(s) and myself.  I personally evaluated the patient during the encounter  Daleen Bo, MD 02/08/15 1122

## 2015-02-08 NOTE — Significant Event (Signed)
  ECG reviewed, consistent with rate dependent RBBB.   Manus Gunning, MD

## 2015-02-08 NOTE — Telephone Encounter (Signed)
Pt called in because he was sent to ED last night and is in need of stress test per ED discharge.  He has called the numbers listed on his sheets and was not able to get an appointment.  He was told to call his PCP after making several calls.  Pt's phone number is [phone]. Please advise. Thanks.

## 2015-02-10 ENCOUNTER — Ambulatory Visit (INDEPENDENT_AMBULATORY_CARE_PROVIDER_SITE_OTHER): Payer: 59 | Admitting: Family Medicine

## 2015-02-10 ENCOUNTER — Encounter: Payer: Self-pay | Admitting: Family Medicine

## 2015-02-10 VITALS — BP 160/80 | HR 73 | Temp 98.2°F | Ht 69.0 in | Wt 274.2 lb

## 2015-02-10 DIAGNOSIS — M1000 Idiopathic gout, unspecified site: Secondary | ICD-10-CM

## 2015-02-10 DIAGNOSIS — R079 Chest pain, unspecified: Secondary | ICD-10-CM

## 2015-02-10 DIAGNOSIS — M109 Gout, unspecified: Secondary | ICD-10-CM

## 2015-02-10 DIAGNOSIS — R0609 Other forms of dyspnea: Secondary | ICD-10-CM

## 2015-02-10 MED ORDER — INDOMETHACIN 50 MG PO CAPS: 50.0000 mg | ORAL_CAPSULE | Freq: Three times a day (TID) | ORAL | Status: DC | PRN

## 2015-02-10 MED ORDER — LISINOPRIL 20 MG PO TABS: 20.0000 mg | ORAL_TABLET | Freq: Every day | ORAL | Status: DC

## 2015-02-10 NOTE — Progress Notes (Signed)
Dr. Frederico Hamman T. Adaisha Campise, MD, [facility] [place], [address] Phone: [phone] Fax: [phone]  02/10/2015  Patient: Randy Combs, MRN: [id number], DOB: 06-Feb-1966, 49 y.o.  Primary Physician:  Owens Loffler, MD  Chief Complaint: Hospitalization Follow-up  Subjective:   Randy Combs is a 49 y.o. very pleasant male patient who presents with the following:  Chest pain rule out in hosp f/u: 02/07/2015, urgent work-in  Very pleasant gentleman who recently was in the ER, and he went to the ER for acute worsening chest pain.  He had an EKG and one set of cardiac enzymes which was normal.  Relevant history is that this is an obese gentleman with a history of tobacco abuse, poorly controlled hypertension with recently discontinuing his medication, and he has developed some dyspnea on exertion with relatively small amounts of exertion.  He works on heavy equipment, and he is getting short of breath with relatively little exercise.  He can walk less than 100 feet.  He is getting chest pains and shortness of breath with exertion lifting things for work.  Stopped BP medicine Headache  BP Readings from Last 3 Encounters:  02/10/15 160/80  03/11/14 120/74  03/04/14 142/86     Chest pain with walking - with dyspnea, less than 100 feet.  Also gets it with working heavy equipment.   Gets some pressure in the chest with exertion.  Has some pressure even right now sitting here.   Cardiac risk factors: Quit smoking Sunday HTN No cocaine or other stimulants No known FH of cardiac - mom's side is unknown  Past Medical History, Surgical History, Social History, Family History, Problem List, Medications, and Allergies have been reviewed and updated if relevant.   GEN: No acute illnesses, no fevers, chills. GI: No n/v/d, eating normally Pulm: as above Interactive and getting along well at home.  Otherwise, ROS is as  per the HPI.  Objective:   BP 160/80 mmHg  Pulse 73  Temp(Src) 98.2 F (36.8 C) (Oral)  Ht $R'5\' 9"'TB$  (1.753 m)  Wt 274 lb 4 oz (124.399 kg)  BMI 40.48 kg/m2  SpO2 98%  GEN: WDWN, NAD, Non-toxic, A & O x 3 HEENT: Atraumatic, Normocephalic. Neck supple. No masses, No LAD. Ears and Nose: No external deformity. CV: RRR, No M/G/R. No JVD. No thrill. No extra heart sounds. PULM: CTA B, no wheezes, crackles, rhonchi. No retractions. No resp. distress. No accessory muscle use. EXTR: No c/c/e NEURO Normal gait.  PSYCH: Normally interactive. Conversant. Not depressed or anxious appearing.  Calm demeanor.   Laboratory and Imaging Data: Recent Results (from the past 2160 hour(s))  BNP (order ONLY if patient complains of dyspnea/SOB AND you have documented it for THIS visit)     Status: None   Collection Time: 02/07/15  7:23 PM  Result Value Ref Range   B Natriuretic Peptide 95.7 0.0 - 100.0 pg/mL  Basic metabolic panel     Status: Abnormal   Collection Time: 02/07/15  7:28 PM  Result Value Ref Range   Sodium 140 135 - 145 mmol/L   Potassium 3.5 3.5 - 5.1 mmol/L   Chloride 110 96 - 112 mmol/L   CO2 22 19 - 32 mmol/L   Glucose, Bld 152 (H) 70 - 99 mg/dL   BUN 14 6 - 23 mg/dL   Creatinine, Ser 0.97 0.50 - 1.35 mg/dL   Calcium 9.0 8.4 - 10.5 mg/dL   GFR  calc non Af Amer >90 >90 mL/min   GFR calc Af Amer >90 >90 mL/min    Comment: (NOTE) The eGFR has been calculated using the CKD EPI equation. This calculation has not been validated in all clinical situations. eGFR's persistently <90 mL/min signify possible Chronic Kidney Disease.    Anion gap 8 5 - 15  Troponin I     Status: None   Collection Time: 02/07/15  7:28 PM  Result Value Ref Range   Troponin I <0.03 <0.031 ng/mL    Comment:        NO INDICATION OF MYOCARDIAL INJURY.     Lipids:    Component Value Date/Time   CHOL 223* 01/19/2009 1127   TRIG 158* 01/19/2009 1127   HDL 39.5 01/19/2009 1127   LDLDIRECT 145.2  01/19/2009 1127   VLDL 32 01/19/2009 1127   CHOLHDL 5.6 CALC 01/19/2009 1127     Assessment and Plan:   Chest pain, unspecified chest pain type - Plan: Ambulatory referral to Cardiology  Dyspnea on exertion - Plan: Ambulatory referral to Cardiology  Podagra - Plan: indomethacin (INDOCIN) 50 MG capsule  Clinical history is concerning with chest pain and shortness of breath with relatively short amounts of exercise.  Cardiac source cannot be excluded.  This has been worsening over time.  Consult cardiology for their opinion and assistance with definitive management.  Start 81 mg aspirin daily.  He understands if he has severe, crushing chest pain to go to the hospital immediately and call 911.  Orders Placed This Encounter  Procedures  . Ambulatory referral to Cardiology    Signed,  Frederico Hamman T. Juliette Standre, MD   Patient's Medications  New Prescriptions   No medications on file  Previous Medications   IBUPROFEN (ADVIL,MOTRIN) 200 MG TABLET    Take 400 mg by mouth every 8 (eight) hours as needed for headache or moderate pain.  Modified Medications   Modified Medication Previous Medication   INDOMETHACIN (INDOCIN) 50 MG CAPSULE indomethacin (INDOCIN) 50 MG capsule      Take 1 capsule (50 mg total) by mouth 3 (three) times daily as needed (gout pain).    Take 1 capsule (50 mg total) by mouth 3 (three) times daily as needed (gout pain).   LISINOPRIL (PRINIVIL,ZESTRIL) 20 MG TABLET lisinopril (PRINIVIL,ZESTRIL) 20 MG tablet      Take 1 tablet (20 mg total) by mouth daily.    Take 1 tablet (20 mg total) by mouth daily.  Discontinued Medications   No medications on file

## 2015-02-10 NOTE — Telephone Encounter (Signed)
This encounter was created in error - please disregard.

## 2015-02-10 NOTE — Progress Notes (Signed)
Pre visit review using our clinic review tool, if applicable. No additional management support is needed unless otherwise documented below in the visit note. 

## 2015-02-10 NOTE — Patient Instructions (Signed)

## 2015-02-11 ENCOUNTER — Telehealth: Payer: Self-pay | Admitting: Family Medicine

## 2015-02-11 NOTE — Telephone Encounter (Signed)
emmi mailed  °

## 2015-02-22 ENCOUNTER — Encounter (INDEPENDENT_AMBULATORY_CARE_PROVIDER_SITE_OTHER): Payer: Self-pay

## 2015-02-22 ENCOUNTER — Encounter: Payer: Self-pay | Admitting: *Deleted

## 2015-02-22 ENCOUNTER — Ambulatory Visit (INDEPENDENT_AMBULATORY_CARE_PROVIDER_SITE_OTHER): Payer: 59 | Admitting: Cardiovascular Disease

## 2015-02-22 ENCOUNTER — Encounter: Payer: Self-pay | Admitting: Cardiovascular Disease

## 2015-02-22 VITALS — BP 118/62 | HR 79 | Ht 70.0 in | Wt 262.5 lb

## 2015-02-22 DIAGNOSIS — I10 Essential (primary) hypertension: Secondary | ICD-10-CM

## 2015-02-22 DIAGNOSIS — R079 Chest pain, unspecified: Secondary | ICD-10-CM

## 2015-02-22 DIAGNOSIS — I208 Other forms of angina pectoris: Secondary | ICD-10-CM

## 2015-02-22 DIAGNOSIS — I209 Angina pectoris, unspecified: Secondary | ICD-10-CM | POA: Insufficient documentation

## 2015-02-22 MED ORDER — ASPIRIN EC 81 MG PO TBEC: 81.0000 mg | DELAYED_RELEASE_TABLET | Freq: Every day | ORAL | Status: DC

## 2015-02-22 NOTE — Assessment & Plan Note (Signed)
The patient's symptoms are consistent with class III angina which started one month ago. I advised him to start taking aspirin 81 mg once daily. I discussed different management options with him including proceeding with a stress test first versus cardiac catheterization. The patient does not think he can even do few minutes on the treadmill given his symptoms. I discussed risks and benefits of cardiac catheterization and he wants to proceed with this. I scheduled the procedure for this week on Wednesday. He is to avoid significant exertion until then.

## 2015-02-22 NOTE — Progress Notes (Signed)
Primary care physician: Dr. Lorelei Pont  HPI  Randy Combs is a 49 y.o. very pleasant male patient who was referred for evaluation of chest pain. He has known history of tobacco use, obesity and poorly controlled hypertension. Over the last month, he has experienced progressive symptoms of exertional shortness of breath, dizziness and chest tightness. This is now happening with minimal activities but not at rest. It has forced him to modify his work. 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 He can walk less than 100 feet. No previous similar symptoms. He went to the emergency room recently and had negative basic workup including troponin and EKG. He quit smoking since then. He started taking lisinopril 20 mg once daily with improvement in blood pressure but not symptoms. He has no family history of coronary artery disease. He has no history of diabetes. However, he had a recent random blood sugar reading of 243.  No Known Allergies   Current Outpatient Prescriptions on File Prior to Visit  Medication Sig Dispense Refill  . ibuprofen (ADVIL,MOTRIN) 200 MG tablet Take 400 mg by mouth every 8 (eight) hours as needed for headache or moderate pain.    . indomethacin (INDOCIN) 50 MG capsule Take 1 capsule (50 mg total) by mouth 3 (three) times daily as needed (gout pain). 60 capsule 5  . lisinopril (PRINIVIL,ZESTRIL) 20 MG tablet Take 1 tablet (20 mg total) by mouth daily. 90 tablet 3   No current facility-administered medications on file prior to visit.     Past Medical History  Diagnosis Date  . Alcohol abuse, in remission     quit in 2008  . Gout   . Torn meniscus   . Tobacco abuse, in remission     Quit 2010--Dipped tin/day x 25 years     Past Surgical History  Procedure Laterality Date  . Appendectomy  1970's     Family History  Problem Relation Age of Onset  . Lung cancer Mother   . Healthy Father   . Healthy  Child   . Healthy Child   . Colon cancer Neg Hx   . Esophageal cancer Neg Hx      History   Social History  . Marital Status: Married    Spouse Name: N/A  . Number of Children: N/A  . Years of Education: N/A   Occupational History  . Not on file.   Social History Main Topics  . Smoking status: Former Smoker -- 20 years    Types: Cigars    Quit date: 02/07/2013  . Smokeless tobacco: Current User  . Alcohol Use: No     Comment: Quit in 2008  . Drug Use: No  . Sexual Activity: Not Currently   Other Topics Concern  . Not on file   Social History Narrative   Grading work (runs equipment)      Married      Regular exercise      Quit alcohol;tobacco      No drugs     ROS A 10 point review of system was performed. It is negative other than that mentioned in the history of present illness.   PHYSICAL EXAM   BP 118/62 mmHg  Pulse 79  Ht 5\' 10"  (1.778 m)  Wt 262 lb 8 oz (119.069 kg)  BMI 37.66 kg/m2 Constitutional: He is oriented to person, place, and time. He appears well-developed and well-nourished. No distress.  HENT: No nasal discharge.  Head: Normocephalic and atraumatic.  Eyes: Pupils are equal and round.  No discharge. Neck: Normal range of motion. Neck supple. No JVD present. No thyromegaly present.  Cardiovascular: Normal rate, regular rhythm, normal heart sounds. Exam reveals no gallop and no friction rub. No murmur heard.  Pulmonary/Chest: Effort normal and breath sounds normal. No stridor. No respiratory distress. He has no wheezes. He has no rales. He exhibits no tenderness.  Abdominal: Soft. Bowel sounds are normal. He exhibits no distension. There is no tenderness. There is no rebound and no guarding.  Musculoskeletal: Normal range of motion. He exhibits no edema and no tenderness.  Neurological: He is alert and oriented to person, place, and time. Coordination normal.  Skin: Skin is warm and dry. No rash noted. He is not diaphoretic. No  erythema. No pallor.  Psychiatric: He has a normal mood and affect. His behavior is normal. Judgment and thought content normal.       EKG: Sinus  Rhythm  WITHIN NORMAL LIMITS   ASSESSMENT AND PLAN

## 2015-02-22 NOTE — Assessment & Plan Note (Signed)
Blood pressure improved significantly after the addition of lisinopril.

## 2015-02-22 NOTE — Patient Instructions (Addendum)
Your physician has requested that you have a cardiac catheterization. Cardiac catheterization is used to diagnose and/or treat various heart conditions. Doctors may recommend this procedure for a number of different reasons. The most common reason is to evaluate chest pain. Chest pain can be a symptom of coronary artery disease (CAD), and cardiac catheterization can show whether plaque is narrowing or blocking your heart's arteries. This procedure is also used to evaluate the valves, as well as measure the blood flow and oxygen levels in different parts of your heart. For further information please visit [email or web address]. Please follow instruction sheet, as given.   Your physician recommends that you have labs today:  CBC BMP INR   Your physician has recommended you make the following change in your medication:  Aspirin 81 mg once daily   You can take your Lisinopril the morning of your procedure

## 2015-02-23 ENCOUNTER — Telehealth: Payer: Self-pay | Admitting: *Deleted

## 2015-02-23 ENCOUNTER — Inpatient Hospital Stay: Payer: Self-pay | Admitting: Specialist

## 2015-02-23 LAB — CBC WITH DIFFERENTIAL/PLATELET
BASOS ABS: 0.1 10*3/uL (ref 0.0–0.2)
Basos: 1 %
Eos: 2 %
Eosinophils Absolute: 0.1 10*3/uL (ref 0.0–0.4)
HCT: 22.6 % — ABNORMAL LOW (ref 37.5–51.0)
Hemoglobin: 6.4 g/dL — CL (ref 12.6–17.7)
IMMATURE GRANS (ABS): 0 10*3/uL (ref 0.0–0.1)
Immature Granulocytes: 0 %
LYMPHS ABS: 1.4 10*3/uL (ref 0.7–3.1)
LYMPHS: 22 %
MCH: 19.3 pg — AB (ref 26.6–33.0)
MCHC: 28.3 g/dL — AB (ref 31.5–35.7)
MCV: 68 fL — AB (ref 79–97)
MONOCYTES: 8 %
Monocytes Absolute: 0.5 10*3/uL (ref 0.1–0.9)
NEUTROS PCT: 67 %
Neutrophils Absolute: 4.3 10*3/uL (ref 1.4–7.0)
PLATELETS: 453 10*3/uL — AB (ref 150–379)
RBC: 3.32 x10E6/uL — ABNORMAL LOW (ref 4.14–5.80)
RDW: 17 % — ABNORMAL HIGH (ref 12.3–15.4)
WBC: 6.4 10*3/uL (ref 3.4–10.8)

## 2015-02-23 LAB — BASIC METABOLIC PANEL
BUN/Creatinine Ratio: 13 (ref 9–20)
BUN: 15 mg/dL (ref 6–24)
CALCIUM: 9.8 mg/dL (ref 8.7–10.2)
CHLORIDE: 101 mmol/L (ref 97–108)
CO2: 21 mmol/L (ref 18–29)
Creatinine, Ser: 1.18 mg/dL (ref 0.76–1.27)
GFR calc Af Amer: 84 mL/min/{1.73_m2} (ref >59)
GFR, EST NON AFRICAN AMERICAN: 73 mL/min/{1.73_m2} (ref >59)
GLUCOSE: 97 mg/dL (ref 65–99)
POTASSIUM: 5.2 mmol/L (ref 3.5–5.2)
SODIUM: 139 mmol/L (ref 134–144)

## 2015-02-23 LAB — PROTIME-INR
INR: 0.9 (ref 0.8–1.2)
Prothrombin Time: 9.8 s (ref 9.1–12.0)

## 2015-02-23 MED ORDER — CEFAZOLIN SODIUM 10 G IJ SOLR
3.0000 g | INTRAMUSCULAR | Status: AC
  Filled 2015-02-23: qty 3000

## 2015-02-23 NOTE — Telephone Encounter (Signed)
-----   Message from Tracie Harrier, RN sent at 02/23/2015  8:55 AM EDT ----- RN reviewed.  Forwarded to MD.

## 2015-02-23 NOTE — Telephone Encounter (Signed)
Spoke with patient  He stated he will go to the ED now   [facility] triage nurse in ED to inform him of patient situation

## 2015-02-23 NOTE — Telephone Encounter (Signed)
Cancelled cath at [facility] scheduled for 3/23 bc patient is admitted at [facility] confirmed

## 2015-02-23 NOTE — Progress Notes (Signed)
LOW HB patient to go to ED  Dr. Fletcher Anon aware

## 2015-02-23 NOTE — Telephone Encounter (Signed)
Hb result called to Dr. Fletcher Anon of 6.4 Per Dr. Fletcher Anon have patient go to the ED  Called patient to inform him of results  No Answer LVM

## 2015-02-24 ENCOUNTER — Ambulatory Visit (HOSPITAL_COMMUNITY): Admission: RE | Admit: 2015-02-24 | Payer: 59 | Source: Ambulatory Visit | Admitting: Cardiovascular Disease

## 2015-02-24 ENCOUNTER — Encounter (HOSPITAL_COMMUNITY): Admission: RE | Payer: Self-pay | Source: Ambulatory Visit

## 2015-02-24 DIAGNOSIS — R0602 Shortness of breath: Secondary | ICD-10-CM

## 2015-02-24 DIAGNOSIS — I10 Essential (primary) hypertension: Secondary | ICD-10-CM

## 2015-02-24 DIAGNOSIS — D649 Anemia, unspecified: Secondary | ICD-10-CM | POA: Diagnosis not present

## 2015-02-24 SURGERY — LEFT HEART CATHETERIZATION WITH CORONARY ANGIOGRAM

## 2015-02-25 DIAGNOSIS — R0602 Shortness of breath: Secondary | ICD-10-CM | POA: Diagnosis not present

## 2015-02-25 LAB — HEMOGLOBIN: HGB: 8.1 g/dL — AB (ref 13.0–18.0)

## 2015-02-26 LAB — BASIC METABOLIC PANEL
Anion Gap: 7 (ref 7–16)
BUN: 15 mg/dL
CHLORIDE: 105 mmol/L
CO2: 29 mmol/L
CREATININE: 1.15 mg/dL
Calcium, Total: 9.3 mg/dL
EGFR (African American): >60
GLUCOSE: 86 mg/dL
POTASSIUM: 4 mmol/L
Sodium: 141 mmol/L

## 2015-02-26 LAB — CBC WITH DIFFERENTIAL/PLATELET
BASOS PCT: 1.2 %
Basophil #: 0.1 10*3/uL (ref 0.0–0.1)
EOS PCT: 2.7 %
Eosinophil #: 0.2 10*3/uL (ref 0.0–0.7)
HCT: 25.4 % — ABNORMAL LOW (ref 40.0–52.0)
HGB: 7.5 g/dL — AB (ref 13.0–18.0)
LYMPHS PCT: 20.9 %
Lymphocyte #: 1.5 10*3/uL (ref 1.0–3.6)
MCH: 20.3 pg — ABNORMAL LOW (ref 26.0–34.0)
MCHC: 29.7 g/dL — ABNORMAL LOW (ref 32.0–36.0)
MCV: 68 fL — ABNORMAL LOW (ref 80–100)
Monocyte #: 0.6 x10 3/mm (ref 0.2–1.0)
Monocyte %: 8.7 %
NEUTROS PCT: 66.5 %
Neutrophil #: 4.9 10*3/uL (ref 1.4–6.5)
Platelet: 444 10*3/uL — ABNORMAL HIGH (ref 150–440)
RBC: 3.71 10*6/uL — AB (ref 4.40–5.90)
RDW: 19.9 % — ABNORMAL HIGH (ref 11.5–14.5)
WBC: 7.4 10*3/uL (ref 3.8–10.6)

## 2015-02-26 LAB — MAGNESIUM: Magnesium: 2.1 mg/dL

## 2015-03-01 ENCOUNTER — Encounter: Payer: Self-pay | Admitting: Physician Assistant

## 2015-03-01 ENCOUNTER — Encounter: Payer: Self-pay | Admitting: Family Medicine

## 2015-03-01 ENCOUNTER — Ambulatory Visit (INDEPENDENT_AMBULATORY_CARE_PROVIDER_SITE_OTHER): Payer: 59 | Admitting: Family Medicine

## 2015-03-01 VITALS — BP 102/60 | HR 104 | Temp 99.3°F | Wt 261.8 lb

## 2015-03-01 DIAGNOSIS — M109 Gout, unspecified: Secondary | ICD-10-CM

## 2015-03-01 DIAGNOSIS — K922 Gastrointestinal hemorrhage, unspecified: Secondary | ICD-10-CM | POA: Insufficient documentation

## 2015-03-01 DIAGNOSIS — M1A09X0 Idiopathic chronic gout, multiple sites, without tophus (tophi): Secondary | ICD-10-CM | POA: Insufficient documentation

## 2015-03-01 DIAGNOSIS — D509 Iron deficiency anemia, unspecified: Secondary | ICD-10-CM

## 2015-03-01 DIAGNOSIS — M1009 Idiopathic gout, multiple sites: Secondary | ICD-10-CM

## 2015-03-01 HISTORY — DX: Gout, unspecified: M10.9

## 2015-03-01 MED ORDER — COLCHICINE 0.6 MG PO TABS: 0.6000 mg | ORAL_TABLET | Freq: Two times a day (BID) | ORAL | Status: DC

## 2015-03-01 NOTE — Progress Notes (Signed)
Pre visit review using our clinic review tool, if applicable. No additional management support is needed unless otherwise documented below in the visit note. 

## 2015-03-01 NOTE — Progress Notes (Signed)
Dr. Frederico Hamman T. Florrie Ramires, MD, [facility] Medicine [place], [address] Phone: [phone] Fax: [phone]  03/01/2015  Patient: Randy Combs, MRN: [id number], DOB: [birth year], 49 y.o.  Primary Physician:  Owens Loffler, MD  Chief Complaint: Hospitalization Follow-up and Gout  Subjective:   Randy Combs is a 49 y.o. very pleasant male patient who presents with the following:   patient is here in hospital follow-up. He was admitted 02/23/2015 at [facility], and was discharged on 02/26/2015.    he was initially admitted with a hemoglobin of 6 , and was ultimately found to have a slow GI bleed. He was transfused 2 units of packed red blood cells. Dr. Anastasio Champion saw the patient in consultation. An EGD was performed.    he also had a nuclear stress test which was normal with no signs of any potential  Coronary disease.   Patient was discharged home. He actually has not filled any of his medication for his iron , and has not been able to find it.   He also has some significant gout, and he is lifelong discontinued from NSAIDs. He saw Dr. Durward Fortes today, and he aspirated and injected his knee. He also has some significant gout in his right elbow.  Past Medical History, Surgical History, Social History, Family History, Problem List, Medications, and Allergies have been reviewed and updated if relevant.   GEN: No acute illnesses, no fevers, chills. GI: No n/v/d, eating normally Pulm: No SOB Interactive and getting along well at home.  Otherwise, ROS is as per the HPI.  Objective:   BP 102/60 mmHg  Pulse 104  Temp(Src) 99.3 F (37.4 C) (Oral)  Wt 261 lb 12.8 oz (118.752 kg)  SpO2 97%  GEN: WDWN, NAD, Non-toxic, A & O x 3 HEENT: Atraumatic, Normocephalic. Neck supple. No masses, No LAD. Ears and Nose: No external deformity. CV: RRR, No M/G/R. No JVD. No thrill. No extra heart sounds. PULM: CTA B, no wheezes, crackles,  rhonchi. No retractions. No resp. distress. No accessory muscle use. EXTR: No c/c/e NEURO Normal gait.  PSYCH: Normally interactive. Conversant. Not depressed or anxious appearing.  Calm demeanor.   Laboratory and Imaging Data:  Assessment and Plan:   Acute upper GI bleed  Anemia, iron deficiency  Acute idiopathic gout of multiple sites   for acute gout, start culture seen twice a day. He cannot take slightly more than this if he is able to tolerated.   restart iron as soon as possible.   Continue all other medications, and when his gout is improved, we can start him on some allopurinol.  Follow-up: No Follow-up on file.  New Prescriptions   COLCHICINE 0.6 MG TABLET    Take 1 tablet (0.6 mg total) by mouth 2 (two) times daily.   No orders of the defined types were placed in this encounter.    Signed,  Maud Deed. Schelly Chuba, MD  Patient Instructions  Iron Sulfate or Ferrous Sulfate 325 mg, 1 tablet twice a day.  When you gout feels like it is 100% gone, please call me so we can start your daily gout medicine.       Patient's Medications  New Prescriptions   COLCHICINE 0.6 MG TABLET    Take 1 tablet (0.6 mg total) by mouth 2 (two) times daily.  Previous Medications   ACETAMINOPHEN (TYLENOL) 325 MG TABLET    Take 650 mg by mouth every 4 (four) hours as needed (  for fever over 100.4).   IRON PO    Take 200 mg by mouth daily.   LISINOPRIL (PRINIVIL,ZESTRIL) 20 MG TABLET    Take 1 tablet (20 mg total) by mouth daily.   PANTOPRAZOLE (PROTONIX) 40 MG TABLET    Take 40 mg by mouth 2 (two) times daily.   SUCRALFATE (CARAFATE) 1 G TABLET    Take 1 g by mouth 4 (four) times daily as needed.  Modified Medications   No medications on file  Discontinued Medications   ASPIRIN EC 81 MG TABLET    Take 1 tablet (81 mg total) by mouth daily.   IBUPROFEN (ADVIL,MOTRIN) 200 MG TABLET    Take 400 mg by mouth every 8 (eight) hours as needed for headache or moderate pain.   INDOMETHACIN  (INDOCIN) 50 MG CAPSULE    Take 1 capsule (50 mg total) by mouth 3 (three) times daily as needed (gout pain).

## 2015-03-01 NOTE — Patient Instructions (Addendum)
Iron Sulfate or Ferrous Sulfate 325 mg, 1 tablet twice a day.  When you gout feels like it is 100% gone, please call me so we can start your daily gout medicine.

## 2015-03-16 LAB — TROPONIN I
Troponin-I: <0.03 ng/mL
Troponin-I: <0.03 ng/mL

## 2015-03-16 LAB — CBC WITH DIFFERENTIAL/PLATELET
Basophil #: 0.1 10*3/uL (ref 0.0–0.1)
Basophil %: 1.5 %
EOS PCT: 3 %
Eosinophil #: 0.2 10*3/uL (ref 0.0–0.7)
HCT: 23.7 % — AB (ref 40.0–52.0)
HGB: 7 g/dL — AB (ref 13.0–18.0)
LYMPHS PCT: 25.9 %
Lymphocyte #: 1.8 10*3/uL (ref 1.0–3.6)
MCH: 20.2 pg — ABNORMAL LOW (ref 26.0–34.0)
MCHC: 29.7 g/dL — ABNORMAL LOW (ref 32.0–36.0)
MCV: 68 fL — AB (ref 80–100)
MONOS PCT: 7.7 %
Monocyte #: 0.5 x10 3/mm (ref 0.2–1.0)
NEUTROS ABS: 4.3 10*3/uL (ref 1.4–6.5)
Neutrophil %: 61.9 %
Platelet: 451 10*3/uL — ABNORMAL HIGH (ref 150–440)
RBC: 3.48 10*6/uL — AB (ref 4.40–5.90)
RDW: 19.4 % — ABNORMAL HIGH (ref 11.5–14.5)
WBC: 6.9 10*3/uL (ref 3.8–10.6)

## 2015-03-16 LAB — BASIC METABOLIC PANEL
Anion Gap: 7 (ref 7–16)
BUN: 15 mg/dL
CO2: 27 mmol/L
CREATININE: 1.1 mg/dL
Calcium, Total: 9.1 mg/dL
Chloride: 107 mmol/L
EGFR (African American): >60
EGFR (Non-African Amer.): >60
GLUCOSE: 81 mg/dL
Potassium: 4 mmol/L
SODIUM: 141 mmol/L

## 2015-03-16 LAB — HEMOGLOBIN: HGB: 7.2 g/dL — ABNORMAL LOW (ref 13.0–18.0)

## 2015-03-17 ENCOUNTER — Encounter: Payer: Self-pay | Admitting: Physician Assistant

## 2015-03-22 ENCOUNTER — Telehealth: Payer: Self-pay

## 2015-03-22 NOTE — Telephone Encounter (Signed)
Rey Dansby [phone]  Legrand Como dropped off some FMLA paperwork he needs filled out for his wife's job. I placed on your desk.

## 2015-03-22 NOTE — Telephone Encounter (Signed)
Noted  

## 2015-03-23 ENCOUNTER — Telehealth: Payer: Self-pay | Admitting: Family Medicine

## 2015-03-23 NOTE — Telephone Encounter (Signed)
Opened in error

## 2015-03-23 NOTE — Telephone Encounter (Signed)
fmla paperwork filled out for your review and signature Put in Dr Copland's IN BOX

## 2015-03-23 NOTE — Telephone Encounter (Signed)
Noted - will complete later in week.

## 2015-03-24 DIAGNOSIS — Z7689 Persons encountering health services in other specified circumstances: Secondary | ICD-10-CM

## 2015-03-25 NOTE — Telephone Encounter (Signed)
Opened error

## 2015-03-25 NOTE — Telephone Encounter (Signed)
Spoke with pt he will pick up paper and turn in to HR himself  Copy for patient Copy for charge Copy to scan Copy for file

## 2015-04-04 NOTE — Consult Note (Signed)
Chief Complaint:  Subjective/Chief Complaint Please see egd report.  No active bleeding, however several ulcers in the upper body of stomach a/w moderate sized hiatal hernia.  Checking h. pylori, though these are most likely due to hiatal hernia/cameron lesions/erosions.   Copntinue bid ppi, add carafate 1 gm qid for one month. Will need ugis as o/p and GI o/p fu, with repeat scope in about 7 weeks. Dr Allen Norris available tomorrow if needed.   Electronic Signatures: Loistine Simas (MD)  (Signed 24-Mar-16 20:00)  Authored: Chief Complaint   Last Updated: 24-Mar-16 20:00 by Loistine Simas (MD)

## 2015-04-04 NOTE — Consult Note (Signed)
General Aspect Primary Cardiologist: Dr. Kirke Corin, MD ______________  49 year old male with history of HTN, hiatal hernia, prior alcohol abuse, and prior tobacco abuse who has been experiencing increased fatigue and was scheduled for cardiac cath today 2/2 to this presented to [facility] on 02/23/2015 with newly diagnosed anemia with hgb of 6.4 found on pre-cath labs.  ______________  PMH: 1. HTN 2. Hiatal hernia 3. Prior alcohol abuse 4. Prior tobacco abuse 5. Torn meniscus ______________   Present Illness 49 year old male with the above problem list who presented to [facility] on 02/23/2015 after being called with his pre-cath labs and being told he had a newly diagnosed severe anemia of 6.4. He denies any prior known history of cardiac problems. He was recently evaluated at [facility] ED on 02/07/2015 for 3 week history of SOB with assocaited activities. He would become quite SOB and develop a chest pressure with activity. The symptoms resolved with rest at that time. He reported self discontinuing his antihypertensives 3 months prior at that time 2/2 he did not like them. During his ED visit a bmet was checked which was normal except for glucose of 152, a pBNP showed a level of 95.7, and a troponin level was <0.03 x 1. A CBC was not drawn at that time. He was sinus tachycardic initially which improved prior to being discharged from the ED. He was advised to follow up with cardiology as an outpatient.   He followed up with his PCP, Dr. Hannah Beat, on 02/10/2015. He was referred to Dr. Kirke Corin at that time. He was also restarted on his antihypertensives. He was also noted to be taking Indocin regularly for his chronic knee pain, though he was doing this on his own. He followed up with Dr. Kirke Corin on 02/22/2015. He was noted to not be able to walk 100 feet without getting SOB. 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 He was fine once he got there however. He was scheduled for a cardiac cath as the  patient felt he could not complete a stress test. His pre-cath labs showed a newly diagnosed severe anemia with a hgb of 6.4. He was advised to come to [facility] for further evaluation.   He states he has been having melena for the past 1 month. No prior colonoscopy. He has had 1 EGD previously-->hiatal hernia. He has had some chest pressure, nasuea, dry heaves, and diaphoresis at times. He also notes a 12 pound weight difference in the scales from [facility] ED to this admission. Upon his arrival to [facility] he was found to have a hgb of 6.4. He received 2 units of pRBC with improvement of his hgb to 7.0 this morning. He notes feeling significantly better from a fatigue and SOB standpoint. He is now able to walk to the restroom without symptoms. Troponin negative x 1.   Physical Exam:  GEN no acute distress, obese   HEENT hearing intact to voice, moist oral mucosa   NECK supple  no JVD   RESP normal resp effort  clear BS   CARD Regular rate and rhythm  Normal, S1, S2  No murmur   ABD denies tenderness  soft   EXTR negative edema   SKIN normal to palpation   NEURO cranial nerves intact   PSYCH alert, A+O to time, place, person, good insight   Review of Systems:  General: Fatigue  Weakness   Skin: No Complaints   ENT: No Complaints  Eyes: No Complaints   Neck: No Complaints   Respiratory: Short of breath   Cardiovascular: Chest pain or discomfort  Dyspnea   Gastrointestinal: Nausea  Black tarry stools   Genitourinary: No Complaints   Vascular: No Complaints   Musculoskeletal: No Complaints   Neurologic: No Complaints   Hematologic: No Complaints   Endocrine: No Complaints   Psychiatric: No Complaints   Review of Systems: All other systems were reviewed and found to be negative   Medications/Allergies Reviewed Medications/Allergies reviewed   Family & Social History:  Family and Social History:  Family History patient is unsure as he is adopted   Social History  negative tobacco, negative ETOH, negative Illicit drugs   + Tobacco Current (within 1 year)  quit smoking 2 weeks ago   Place of Living Home  lives with his wife     Hypertension:    GOUT:    RIGHT KNEE:   Home Medications: Medication Instructions Status  indomethacin 50 mg oral capsule 1 cap(s) orally 3 times a day, As Needed Active  lisinopril 20 mg oral tablet 1 tab(s) orally once a day Active  aspirin 81 mg oral tablet 1 tab(s) orally once a day Active   Lab Results:  Hepatic:  22-Mar-16 10:33   Bilirubin, Total 0.5 (0.3-1.2 NOTE: New Reference Range  01/26/15)  Alkaline Phosphatase 54 (38-126 NOTE: New Reference Range  01/26/15)  SGPT (ALT) 22 (17-63 NOTE: New Reference Range  01/26/15)  SGOT (AST) 23 (15-41 NOTE: New Reference Range  01/26/15)  Total Protein, Serum 7.5 (6.5-8.1 NOTE: New Reference Range  01/26/15)  Albumin, Serum 4.1 (3.5-5.0 NOTE: New reference range  01/26/15)  Routine BB:  22-Mar-16 10:33   ABO Group + Rh Type O Positive  Antibody Screen NEGATIVE (Result(s) reported on 23 Feb 2015 at 12:03PM.)  Crossmatch Unit 1 Ready  Crossmatch Unit 2 Issued  Crossmatch Unit 3 Issued (Result(s) reported on 23 Feb 2015 at 04:38PM.)  Routine Chem:  22-Mar-16 10:33   Ferritin ([facility])  4 (24-336 NOTE: New Reference Range  01/26/15)  Iron Binding Capacity (TIBC)  463  Unbound Iron Binding Capacity 454.0  Iron, Serum  9 (45-182 NOTE: New Reference Range  01/26/15)  Iron Saturation 2 (Result(s) reported on 23 Feb 2015 at 01:04PM.)  Glucose, Serum  106 (65-99 NOTE: New Reference Range  01/26/15)  BUN 19 (6-20 NOTE: New Reference Range  01/26/15)  Creatinine (comp)  1.39 (0.61-1.24 NOTE: New Reference Range  01/26/15)  Sodium, Serum 138 (135-145 NOTE: New Reference Range  01/26/15)  Potassium, Serum 4.7 (3.5-5.1 NOTE: New Reference Range  01/26/15)  Chloride, Serum 103 (101-111 NOTE: New Reference Range  01/26/15)  CO2, Serum 27  (22-32 NOTE: New Reference Range  01/26/15)  Calcium (Total), Serum 9.4 (8.9-10.3 NOTE: New Reference Range  01/26/15)  eGFR (African American) >60  eGFR (Non-African American)  60 (eGFR values <77mL/min/1.73 m2 may be an indication of chronic kidney disease (CKD). Calculated eGFR is useful in patients with stable renal function. The eGFR calculation will not be reliable in acutely ill patients when serum creatinine is changing rapidly. It is not useful in patients on dialysis. The eGFR calculation may not be applicable to patients at the low and high extremes of body sizes, pregnant women, and vegetarians.)  Anion Gap 8  Cardiac:  22-Mar-16 10:33   Troponin I <0.03 (0.00-0.03 0.03 ng/mL or less: NEGATIVE  Repeat testing in 3-6 hrs  if clinically indicated. >0.03 ng/mL: POTENTIAL  MYOCARDIAL INJURY. Repeat  testing in 3-6 hrs if  clinically indicated. NOTE: An increase or decrease  of 30% or more on serial  testing suggests a  clinically important change NOTE: New Reference Range  01/26/15)  Routine Coag:  22-Mar-16 10:33   Prothrombin 13.6 (11.4-15.0 NOTE: New Reference Range  01/01/15)  INR 1.0 (INR reference interval applies to patients on anticoagulant therapy. A single INR therapeutic range for coumarins is not optimal for all indications; however, the suggested range for most indications is 2.0 - 3.0. Exceptions to the INR Reference Range may include: Prosthetic heart valves, acute myocardial infarction, prevention of myocardial infarction, and combinations of aspirin and anticoagulant. The need for a higher or lower target INR must be assessed individually. Reference: The Pharmacology and Management of the Vitamin K  antagonists: the seventh ACCP Conference on Antithrombotic and Thrombolytic Therapy. [id number].6644 Sept:126 (3suppl): [id number]. A HCT value >55% may artifactually increase the PT.  In one study,  the increase was an average of 25%. Reference:   "Effect on Routine and Special Coagulation Testing Values of Citrate Anticoagulant Adjustment in Patients with High HCT Values." American Journal of Clinical Pathology 2006;126:400-405.)  Routine Hem:  22-Mar-16 10:33   WBC (CBC) 6.6  RBC (CBC)  3.28  Hemoglobin (CBC)  6.4  Hematocrit (CBC)  21.6  Platelet Count (CBC)  443  MCV  66  MCH  19.5  MCHC  29.7  RDW  17.8  Neutrophil % 70.7  Lymphocyte % 18.6  Monocyte % 7.2  Eosinophil % 1.6  Basophil % 1.9  Neutrophil # 4.7  Lymphocyte # 1.2  Monocyte # 0.5  Eosinophil # 0.1  Basophil # 0.1 (Result(s) reported on 23 Feb 2015 at 11:07AM.)   EKG:  EKG Interp. by me   Interpretation NSR, 71 bpm, no significant st/t changes    No Known Allergies:   Vital Signs/Nurse's Notes: **Vital Signs.:   23-Mar-16 05:25  Vital Signs Type Routine  Temperature Temperature (F) 97.8  Celsius 36.5  Temperature Source oral  Pulse Pulse 65  Respirations Respirations 20  Systolic BP Systolic BP 034  Diastolic BP (mmHg) Diastolic BP (mmHg) 69  Mean BP 84  Pulse Ox % Pulse Ox % 97  Pulse Ox Activity Level  At rest  Oxygen Delivery Room Air/ 21 %    Impression 49 year old male with history of HTN, hiatal hernia, prior alcohol abuse, and prior tobacco abuse who has been experiencing increased fatigue and was scheduled for cardiac cath today 2/2 to this presented to [facility] on 02/23/2015 with newly diagnosed anemia with hgb of 6.4 found on pre-cath labs.  1. SOB/fatigue: -Likely 2/2 his newly found severe anemia with associated melena -GI is plannig for EGD and eventual colonoscopy for further evaluation and possible treatment -Echo with normal LV function -Plan for ETT Myoview for risk stratification on 02/25/2015 -Would hold aspirin at this time given his profound anemia  2. Anemia: -Status post transfusion of 2 units pRBC (hgb 6.4-->7.0) -Continue to monitor -Per IM/GI -Hold Indocin and aspirin  3. HTN: -Well controlled -Continue  current medications   Plan Attending Attestation:  I personally seen and evaluated the patient today along with Christell Faith, PA-C.  I have personally reviewed the chart and discussed the patient's case with Mr. Idolina Primer. I agree with his findings, examination and recommendations.  Mr. College was seen by Dr. Fletcher Anon in consultation recently for progressive dyspnea and chest pressure. Based on those symptoms, Dr. Fletcher Anon was concern for crescendo class 3-4 angina  and was recommending cardiac catheterization. However precat  labs indicated for found anemia which may very well explain the patient's symptomatology.  [facility]. Unfortunately we are being asked to comment on the patient's cardiac standpoint prior to being up to have his GI evaluation. He has had an echocardiogram done today that was relatively normal with no regional wall motion about his. His troponins have been negative despite profound anemia. It is highly likely he has significant obstructive CAD in the absence of wall motion analysis or troponin elevations with significant anemia unless his course has been prolonged significantly. Healing notes about a month of symptoms. We discussed options on how to best evaluate the patient's preprocedural risk. Certainly his symptoms were concerning, but the major point of cardiac evaluation is that he still needs his GI bleeding evaluated.  We decided that the best course of action will be to opt for noninvasive nuclear stress testing in the morning which hopefully can be read in that timeframe to have his GI procedures in the afternoon. This would potentially allow [place] to preclude him actually needing cardiac catheterization following this evaluation for anemia. Noninvasive study for the purposes of risk stratification is the only potential course of action to make sense because doing a heart catheterization in the setting of potentially active  bleeding with again also be for risk stratification could not perform intervention until we have a source of bleeding resolved. We would not want him to be on dual antiplatelet therapy only to have request to hold antiplatelet agents.  Recommendation is Myoview for risk stratification purposes only.. Transfuse to keep hemoglobin greater than 8  further cardiac evaluation based on the results of Myoview stress test.  dh   Electronic Signatures: Rise Mu (PA-C)  (Signed 23-Mar-16 11:27)  Authored: General Aspect/Present Illness, History and Physical Exam, Review of System, Family & Social History, Past Medical History, Home Medications, Labs, EKG , Allergies, Vital Signs/Nurse's Notes, Impression/Plan Leonie Man (MD)  (Signed 23-Mar-16 18:17)  Authored: Impression/Plan  Co-Signer: General Aspect/Present Illness, Home Medications, Allergies   Last Updated: 23-Mar-16 18:17 by Leonie Man (MD)

## 2015-04-04 NOTE — Consult Note (Signed)
Chief Complaint:  Subjective/Chief Complaint seen for melena and anemia.   No n/v or abd pain overnight.  tolerated stress test today.   no bm for 2 days.   VITAL SIGNS/ANCILLARY NOTES: **Vital Signs.:   24-Mar-16 13:15  Temperature Temperature (F) 97.6  Celsius 36.4  Temperature Source oral  Pulse Pulse 73  Respirations Respirations 20  Systolic BP Systolic BP 824  Diastolic BP (mmHg) Diastolic BP (mmHg) 68  Mean BP 83  Pulse Ox % Pulse Ox % 99  Pulse Ox Activity Level  At rest  Oxygen Delivery Room Air/ 21 %   Brief Assessment:  Cardiac Regular   Respiratory clear BS   Gastrointestinal details normal Soft  Nontender  Nondistended  Bowel sounds normal   Lab Results: Routine Chem:  22-Mar-16 10:33   Ferritin ([facility])  4 (24-336 NOTE: New Reference Range  01/26/15)  Iron Saturation 2 (Result(s) reported on 23 Feb 2015 at 01:04PM.)  Routine Hem:  22-Mar-16 10:33   Hemoglobin (CBC)  6.4  Platelet Count (CBC)  443  24-Mar-16 10:39   Hemoglobin (CBC)  8.1 (Result(s) reported on 25 Feb 2015 at 11:12AM.)   Radiology Results: Cardiology:    23-Mar-16 08:57, Echo Doppler  Echo Doppler   REASON FOR EXAM:      COMMENTS:       PROCEDURE: [facility] - ECHO DOPPLER COMPLETE(TRANSTHOR)  - Feb 24 2015  8:57AM     RESULT: Echocardiogram Report    Patient Name:   Randy Combs Date of Exam: 02/24/2015  Medical Rec #:  [id number]             Custom1:  Date of Birth:  16-Feb-1966           Height:       70.0 in  Patient Age:    49 years           Weight:       261.0 lb  Patient Gender: M                  BSA:          2.34 m??    Indications: SOB  Sonographer:    Sherrie Sport RDCS  Referring Phys: Abel Presto, J    Summary:   1. Left ventricular ejection fraction, by visual estimation, is 60 to   65%.   2. Normal global left ventricular systolic function.   3. Impaired relaxation pattern of LV diastolic filling.   4. Mild concentric left ventricular hypertrophy.   5. Mildly  increased left ventricular septal thickness.   6. Upper limit of normal left atrium.   7. Mildly increased left ventricular internal cavity size.   8. Pulmonary hypertension is absent.   9. Mildly increased left ventricular posterior wall thickness.  10. Overall Normal study with the exception of Mild Concentric LVH &   associated Grade 1 Diastolic Dysfunction.  11. Prominent moderator band in RV.  12. Otherwise normal echocardiogram.  2D AND M-MODE MEASUREMENTS (normal ranges within parentheses):  Left Ventricle:          Normal  IVSd (2D):      1.24 cm (0.7-1.1)  LVPWd (2D):     1.25 cm (0.7-1.1) Aorta/LA:                  Normal  LVIDd (2D):     5.61 cm (3.4-5.7) Aortic Root (2D): 3.40 cm (2.4-3.7)  LVIDs (2D):    3.64 cm  Left Atrium (2D): 3.10 cm (1.9-4.0)  LV FS (2D):     35.1 %   (>25%)  LV EF (2D):     63.8 %   (>50%)                                    Right Ventricle:                                    RVd (2D):        7.82 cm  LV DIASTOLIC FUNCTION:  MV Peak E: 0.58 m/s E/e' Ratio: 4.80  MV Peak A: 0.71 m/s Decel Time: 257 msec  E/A Ratio: 0.81  SPECTRAL DOPPLER ANALYSIS (where applicable):  Mitral Valve:  MV P1/2 Time: 74.53 msec  MV Area, PHT: 2.95 cm??  Aortic Valve: AoV Max Vel: 1.86m/s AoV Peak PG: 9.2 mmHg AoV Mean PG:  LVOT Vmax: 1.19 m/s LVOT VTI:  LVOT Diameter: 2.10 cm  AoV Area, Vmax: 2.71 cm?? AoV Area, VTI:  AoV Area, Vmn:  Tricuspid Valve and PA/RV Systolic Pressure: TR Max Velocity: 1.95 m/s RA   Pressure: 5 mmHg RVSP/PASP: 20.2 mmHg  Pulmonic Valve:  PV Max Velocity: 1.12 m/s PV Max PG: 5.1 mmHg PV Mean PG:    PHYSICIAN INTERPRETATION:  Left Ventricle: The left ventricular internal cavity size was mildly     increased. LV septal wall thickness was mildly increased. LV posterior   wall thickness was mildly increased. Mild concentric left ventricular   hypertrophy. The left ventricular hypertrophy involves all walls. Global   LV systolic  function was normal. Left ventricular ejection fraction, by   visual estimation, is 60 to 65%. Spectral Doppler shows impaired   relaxation pattern of LV diastolic filling. Normal LV filling pressures.    LV Wall Scoring:  All segments are normal.  Right Ventricle: The right ventricle was not well seen. The right   ventricular size is normal. Global RV systolic function is normal.   Prominent moderator band in RV.  Left Atrium: The left atrium is upper limit of normal.  Right Atrium: The right atrium is normal in size.  Pericardium: There is no evidence of pericardial effusion.  Mitral Valve: The mitral valve is normal in structure. There is mild. No   evidence of mitral valve stenosis. Trace mitral valve regurgitation is   seen. Mitral annular calcification.  Tricuspid Valve: The tricuspid valve is normal. Trivial tricuspid   regurgitation is visualized. The tricuspid regurgitant velocity is 1.95   m/s, and with an assumed right atrial pressure of 5 mmHg, the estimated   right ventricular systolic pressure is normal at 20.2 mmHg.  Aortic Valve: The aortic valve is normal. The aortic valve is   structurally normal, with no evidence of sclerosis or stenosis. Trivial   aortic valve regurgitation is seen.  Pulmonic Valve: The pulmonic valve is not well seen. The pulmonic valve   is normal. Trace pulmonic valve regurgitation.  Aorta: The aortic root and ascending aorta are structurally normal, with   no evidence of dilitation.  Pulmonary Artery: Pulmonary hypertension is absent.  Venous: The inferior vena cava was normal sized with respiratory size   variation greater than 50%.    [place]  Electronically signed by [id number] Glenetta Hew  Signature Date/Time: 02/24/2015/11:22:00 AM    *** Final ***  IMPRESSION: .        Verified By: Leonie Green. Ellyn Hack, M.D.,  Nuclear Med:    24-Mar-16 13:27, NM MYOCARDIAL SCAN  NM MYOCARDIAL SCAN   REASON FOR EXAM:    SOB  COMMENTS:        PROCEDURE: NM  - NM MYOCARDIAL SCAN  - Feb 25 2015  1:27PM     RESULT: Nuclear Cardiology Report    Patient Demographics         Name: Randy Combs       Study Date: 02/25/2015   Patient ID: [id number]          Report Date: 02/25/2015          DOB: 1966/08/06 Age: 72 years       Height:          Sex: M                            Weight:  Accession #: [id number]                       Race: White  Ordering Physician: Henreitta Leber MD  Indications  The patient was imaged for the following indications:  Assessment of acute chest pain.    Clinical History  Cardiac risk factors include: hypertension, history of smoking and SOB.  Images were obtained using Rest Tc-90m/stress Tc-49m 1 day protocol.    Procedure  Myocardial perfusion imaging was performed following the injection of   13.228 MCi of 32mTc Sestamibi at 1055.  The patient was injected with 31.33 MCi of 45mTc Sestamibi at 1201 for   the stress portion of the exam.    Stress Test Findings  The following table summarizes the findings:  +-------------+-------------------+-------------------+                 STRESS EKG DATA     REST EKG DATA     +-------------+-------------------+-------------------+  Test Status  Normal             Normal              +-------------+-------------------+-------------------+  Rhythm       Normal sinus rhythmNormal sinus rhythm  +-------------+-------------------+-------------------+  IV ConductionRBBB               RBBB                 +-------------+-------------------+-------------------+  Arrhythmias                     None                 +-------------+-------------------+-------------------+  ST Response  Normal                                  +-------------+-------------------+-------------------+  Overall Impression  The overall study imaging quality was deemed to be good.  The left ventricular global function was normal.  This myocardial perfusion scan showed no evidence of  pathology and has a   normal appearance. The stress to rest volume ratio is .97 for the left   ventricle.  Pharmacological myocardial perfusion study with no significant ischemia.  There is no artifact noted on this study.  No significant wall motion abnormality noted.  The estimated ejection fraction is 55%.  There are no EKG changes concerning for ischemia.  Overall, this is a Low risk scan.    Summary   1. Pharmacological myocardial perfusion study with no significant     ischemia.   2. No significant wall motion abnormality noted.   3. The estimated ejection fraction is 55%.   4. The left ventricular global function was normal.   5. There are no EKG changes concerning for ischemia.   6. There is no artifact noted on this study.   7. Overall, this is a Low risk scan.  Diagnosing Physician: [id number] Ida Rogue MD  Electronically signed at 1:41:47 PM on 02/25/2015    *** Final ***    IMPRESSION: .      Verified By: Minna Merritts, M.D., MD   Assessment/Plan:  Assessment/Plan:  Assessment 1) anemia, melena, presenting with weakness, sob, chest tightness.  Cardiac eval negative including stress test.  Patient takng indocin and ibp regularly as o/p. No evidence of ongoing bleeding since admission.   Plan 1) EGD. I have discussed the risks benefits and complications of egd to include not limited to bleeding infection perforations and sedation and he wishes to proceed. further recs to follow.   Electronic Signatures: Loistine Simas (MD)  (Signed 24-Mar-16 14:21)  Authored: Chief Complaint, VITAL SIGNS/ANCILLARY NOTES, Brief Assessment, Lab Results, Radiology Results, Assessment/Plan   Last Updated: 24-Mar-16 14:21 by Loistine Simas (MD)

## 2015-04-04 NOTE — H&P (Signed)
PATIENT NAME:  Randy Combs, Randy Combs MR#:  [id number] DATE OF BIRTH:  03-07-1966  DATE OF ADMISSION:  02/23/2015  PRIMARY CARE PHYSICIAN:  Dr. Frederico Hamman Copland.   CHIEF COMPLAINT: Weakness, shortness of breath, and chest tightness.   HISTORY OF PRESENT ILLNESS: This is a 49 year old male who presents to the Emergency Room due to abnormal laboratories referred from his cardiologist's office. The patient says that he has been having some weakness, shortness of breath now for the past month or so. He was referred to see Dr. Fletcher Anon by his primary care physician, he saw Dr. Fletcher Anon a few days back and they ordered routine blood work. His hemoglobin was noted to be significantly low. He was therefore called at work and told to come to the ER.  In the Emergency Room, the patient's hemoglobin is still significantly low at 6.4. The patient says that he has been having some melanotic stools now on and off for the past month. He also admits to worsening exertional shortness of breath and chest tightness, but no nausea, no vomiting, no abdominal pain, and no other associated symptoms. Hospitalist services were contacted for treatment and evaluation.   REVIEW OF SYSTEMS CONSTITUTIONAL: No documented fever. Positive generalized weakness. No weight gain or weight loss.  EYES: No blurry or double vision.  EARS, NOSE, AND THROAT: No tinnitus. No postnasal drip. No redness of the oropharynx.  RESPIRATORY: No cough, no wheeze, no hemoptysis. Positive dyspnea.  CARDIOVASCULAR: Positive chest pain. No orthopnea. No palpitations or syncope.  GASTROINTESTINAL: No nausea, no vomiting. No diarrhea. No abdominal pain. No melena or hematochezia.  GENITOURINARY: No dysuria or hematuria.  ENDOCRINE: No polyuria or nocturia. No heat or cold incontinence.  HEMATOLOGIC:  Positive anemia. No acute bruising or bleeding.  INTEGUMENTARY: No rashes. No lesions.  MUSCULOSKELETAL: No arthritis, no swelling, no gout.  NEUROLOGIC: No  numbness, tingling. No ataxia. No seizure activity.  PSYCHIATRIC: No anxiety. No insomnia. No ADD.   PAST MEDICAL HISTORY: Consistent with hypertension, history of hiatal hernia, and gout.  ALLERGIES: No known drug allergies.   SOCIAL HISTORY: Used to be a smoker, quit about 2 weeks ago. Does have a 20-25 pack-year smoking history. Also used to drink heavily, quit 8 years ago, used to drink a lot of liquor and beer.   No other illicit drug abuse. Lives at home with his wife.   FAMILY HISTORY: Mother and father are deceased. His father died from a car accident. He cannot recall what his mother died from. He has no other significant family history.    CURRENT MEDICATIONS: The patient is on a aspirin 81 mg daily, lisinopril 20 mg daily, indomethacin 50 mg t.i.d. as needed.   PHYSICAL EXAMINATION: Presently is as follows:  VITAL SIGNS: Temperature is 97.7, pulse 72, respirations 20, blood pressure 114/71, saturations 100% on room air.  GENERAL: He is a pleasant-appearing male, but in no apparent distress.  HEAD, EYES, EARS, NOSE, AND THROAT:  He is atraumatic, normocephalic. His extraocular muscles are intact. His pupils reactive to light. His sclerae are anicteric. He has pale conjunctiva. No oropharyngeal erythema.  NECK: Supple. There is no jugular venous distention. No bruits, no lymphadenopathy, or thyromegaly.  HEART: Regular rate and rhythm. No murmurs, no rubs, or clicks.  LUNGS: Clear to auscultation bilaterally. No rales or rhonchi. No wheezes.  ABDOMEN: Soft, flat, nontender, nondistended. Has good bowel sounds. No hepatosplenomegaly appreciated.  EXTREMITIES: No evidence of any cyanosis, clubbing, or peripheral edema. Has + 2 pedal and  radial pulses bilaterally.  NEUROLOGICAL: The patient is alert, awake, and oriented x 3, with no focal motor or sensory deficits appreciated bilaterally.  SKIN: Moist and warm with no rashes appreciated.  LYMPHATIC: There is no cervical or axillary  lymphadenopathy.   LABORATORY EXAMINATION: Serum glucose of 106, BUN 19, creatinine 1.3, sodium 138, potassium 4.7, chloride 103, bicarbonate 27. The patient's LFTs are within normal limits. White cell count is 6.6, hemoglobin 6.4, hematocrit 21.6, platelet count of 443,000.   ASSESSMENT AND PLAN: This is a 49 year old male with history of hypertension, hiatal hernia, gout, who presented to the hospital due to shortness of breath, weakness, and chest tightness, noted to be severely anemic.   1.  Gastrointestinal bleed. This is likely an upper GI bleed given the patient's melena for about a month. The patient has been using increasing amounts of Motrin and also indomethacin for his gout. For now I will transfuse the patient 2 units of packed red blood cells, follow serial hemoglobins, continue Protonix drip, get a GI consult. The patient likely needs an endoscopy. 2.  Anemia. This is a microcytic anemia due to the GI bleed. I will transfuse the patient 2 units of packed red blood cells, follow serial hemoglobins, check a ferritin, iron, and TIBC.  3.  Hypertension, presently hemodynamically stable. Continue his lisinopril.  4.  History of gout, no acute attack. Hold indomethacin in the setting of GI bleed presently.   CODE STATUS: The patient is a full code.   TIME SPENT: 50 minutes.     ____________________________ Belia Heman. Verdell Carmine, MD vjs:bu D: 02/23/2015 12:37:41 ET T: 02/23/2015 13:10:38 ET JOB#: [id number]  cc: Belia Heman. Verdell Carmine, MD, <Dictator> Henreitta Leber MD ELECTRONICALLY SIGNED 02/23/2015 17:29

## 2015-04-04 NOTE — Consult Note (Signed)
Chief Complaint:  Subjective/Chief Complaint please see full GI consult and brief consult note.  Patient seen at [facility] about 2 weeks ago for atypicalo chest pain and was arranged to have cardiac cath with Dr Kirke Corin tomorrow there.  Presents here with weakness and increased chest pain.  Found with severe anemia, in the setting of seeing occasional black stools for about a month, apparently more loss noted since seen at [facility] 2 weeks ago.  Of note he is markedly microcytic, indicateng some chronic as well as acute blood loss.  Hemodynamically stable, now being transfused.   Chronic nsaid use for at least several months for gout and knee injury.   No previous colonoscopy, egd 6 years ago showing hiatal hernia.   Continue iv ppi as you are.  Will need to have EGD and evantually colonoscopy, but would recommend cardiac evaluation.  It may be necessary if it is felt that cardiac stenting (with attendant anticoagulation need) or further intervention needs to be done timing of evaluation of the GI standpoint will need to be discussed. Awaiting cardiology consult. Continue current with clears/ no red.   VITAL SIGNS/ANCILLARY NOTES: **Vital Signs.:   22-Mar-16 13:48  Vital Signs Type Admission  Temperature Temperature (F) 98.3  Celsius 36.8  Temperature Source oral  Pulse Pulse 75  Respirations Respirations 20  Systolic BP Systolic BP 115  Diastolic BP (mmHg) Diastolic BP (mmHg) 65  Mean BP 81  Pulse Ox % Pulse Ox % 98  Pulse Ox Activity Level  At rest  Oxygen Delivery Room Air/ 21 %    17:00  Vital Signs Type 15 min Post Blood Start Time  Temperature Temperature (F) 98.7  Temperature Source oral  Pulse Pulse 85  Respirations Respirations 20  Systolic BP Systolic BP 147  Diastolic BP (mmHg) Diastolic BP (mmHg) 72  Mean BP 97   Brief Assessment:  Cardiac Regular   Respiratory clear BS   Gastrointestinal details normal Soft  Nontender  Nondistended  Bowel sounds normal  protuberant   Lab  Results: Hepatic:  22-Mar-16 10:33   Bilirubin, Total 0.5 (0.3-1.2 NOTE: New Reference Range  01/26/15)  Alkaline Phosphatase 54 (38-126 NOTE: New Reference Range  01/26/15)  SGPT (ALT) 22 (17-63 NOTE: New Reference Range  01/26/15)  SGOT (AST) 23 (15-41 NOTE: New Reference Range  01/26/15)  Total Protein, Serum 7.5 (6.5-8.1 NOTE: New Reference Range  01/26/15)  Albumin, Serum 4.1 (3.5-5.0 NOTE: New reference range  01/26/15)  Routine BB:  22-Mar-16 10:33   ABO Group + Rh Type O Positive  Antibody Screen NEGATIVE (Result(s) reported on 23 Feb 2015 at 12:03PM.)  Crossmatch Unit 1 Ready  Crossmatch Unit 2 Issued  Crossmatch Unit 3 Issued (Result(s) reported on 23 Feb 2015 at 04:38PM.)  Routine Chem:  22-Mar-16 10:33   Ferritin ([facility])  4 (24-336 NOTE: New Reference Range  01/26/15)  Unbound Iron Binding Capacity 454.0  Iron, Serum  9 (45-182 NOTE: New Reference Range  01/26/15)  Iron Saturation 2 (Result(s) reported on 23 Feb 2015 at 01:04PM.)  Glucose, Serum  106 (65-99 NOTE: New Reference Range  01/26/15)  BUN 19 (6-20 NOTE: New Reference Range  01/26/15)  Creatinine (comp)  1.39 (0.61-1.24 NOTE: New Reference Range  01/26/15)  Sodium, Serum 138 (135-145 NOTE: New Reference Range  01/26/15)  Potassium, Serum 4.7 (3.5-5.1 NOTE: New Reference Range  01/26/15)  Chloride, Serum 103 (101-111 NOTE: New Reference Range  01/26/15)  CO2, Serum 27 (22-32 NOTE: New Reference Range  01/26/15)  Calcium (Total), Serum 9.4 (8.9-10.3 NOTE: New Reference Range  01/26/15)  eGFR (African American) >60  eGFR (Non-African American)  60 (eGFR values <31mL/min/1.73 m2 may be an indication of chronic kidney disease (CKD). Calculated eGFR is useful in patients with stable renal function. The eGFR calculation will not be reliable in acutely ill patients when serum creatinine is changing rapidly. It is not useful in patients on dialysis. The eGFR calculation may not be  applicable to patients at the low and high extremes of body sizes, pregnant women, and vegetarians.)  Anion Gap 8  Cardiac:  22-Mar-16 10:33   Troponin I <0.03 (0.00-0.03 0.03 ng/mL or less: NEGATIVE  Repeat testing in 3-6 hrs  if clinically indicated. >0.03 ng/mL: POTENTIAL  MYOCARDIAL INJURY. Repeat  testing in 3-6 hrs if  clinically indicated. NOTE: An increase or decrease  of 30% or more on serial  testing suggests a  clinically important change NOTE: New Reference Range  01/26/15)  Routine Coag:  22-Mar-16 10:33   Prothrombin 13.6 (11.4-15.0 NOTE: New Reference Range  01/01/15)  INR 1.0 (INR reference interval applies to patients on anticoagulant therapy. A single INR therapeutic range for coumarins is not optimal for all indications; however, the suggested range for most indications is 2.0 - 3.0. Exceptions to the INR Reference Range may include: Prosthetic heart valves, acute myocardial infarction, prevention of myocardial infarction, and combinations of aspirin and anticoagulant. The need for a higher or lower target INR must be assessed individually. Reference: The Pharmacology and Management of the Vitamin K  antagonists: the seventh ACCP Conference on Antithrombotic and Thrombolytic Therapy. HYWVP.7106 Sept:126 (3suppl): [id number]. A HCT value >55% may artifactually increase the PT.  In one study,  the increase was an average of 25%. Reference:  "Effect on Routine and Special Coagulation Testing Values of Citrate Anticoagulant Adjustment in Patients with High HCT Values." American Journal of Clinical Pathology 2006;126:400-405.)  Routine Hem:  22-Mar-16 10:33   WBC (CBC) 6.6  RBC (CBC)  3.28  Hemoglobin (CBC)  6.4  Hematocrit (CBC)  21.6  Platelet Count (CBC)  443  MCV  66  MCH  19.5  MCHC  29.7  RDW  17.8  Neutrophil % 70.7  Lymphocyte % 18.6  Monocyte % 7.2  Eosinophil % 1.6  Basophil % 1.9  Neutrophil # 4.7  Lymphocyte # 1.2  Monocyte # 0.5   Eosinophil # 0.1  Basophil # 0.1 (Result(s) reported on 23 Feb 2015 at 11:07AM.)   Assessment/Plan:  Assessment/Plan:  Assessment as above   Electronic Signatures: Loistine Simas (MD)  (Signed 22-Mar-16 19:02)  Authored: Chief Complaint, VITAL SIGNS/ANCILLARY NOTES, Brief Assessment, Lab Results, Assessment/Plan   Last Updated: 22-Mar-16 19:02 by Loistine Simas (MD)

## 2015-04-04 NOTE — Consult Note (Signed)
Brief Consult Note: Diagnosis: weakness, sob, chest tightness.   Patient was seen by consultant.   Consult note dictated.   Comments: Appreciate consult for 49 y/o caucasian man admitted with above, found to be anemic with reports of melanotic stools, for evaluation of possible GIB. Patient reports onset of fatigue/weakness, shortness of breath and chest tightness over the last 2-3w. Went to ED in [place] was was advised to have cardiology evaluation as outpatient. Saw Dr Fletcher Anon and was to have cardiac cath tomorrow, however states bloodwork drawn at this office revealed significant anemia and he was advised to present to ED here, which he did. Hgb was 6.4 on admission, with significant microcytosis/hypochromia. Reports having black stools on and off over the last month: had been taking Ibuprofen 800mg  po daily for left knee pain, but says he was told by his ortho provider, that he could take his Indomethacin- his gout med, instead, so has been doing this on a daily basis. States he has had some vague intermittent epigastric discomfort, but denies significant abdominal pain, NVD, dyspepsia, further GI complaints. Had EGD 70yr ago with hiatal hernia, but can't remember other results. No taking any PPI currently. Currently receiving PRBCs, states he is feeling some better. He is hemodynamically stable. WBC normal, platelets somewhat elevated. Liver panel normal. On Pantoprazole gtt. No hx colonoscopy. Patient now thinks his sob/chest tightness was related to his hgb and not his heart;  troponin on admission normal.  Impression/plan: Anemia with melena and history of NSAID use. Suspect upper GI bleed. Agree with xfusion, following hgb, PPI. Likely will benefit from EGD for luminal eval as clinically feasible- risks and benefits discussed, will discuss further with Dr Gustavo Lah. He should avoid NSAIDs- rationale discussed, and will need to be on PPI for a while. Further recommendations to follow.  Electronic  Signatures: Stephens November H (NP)  (Signed 22-Mar-16 14:55)  Authored: Brief Consult Note   Last Updated: 22-Mar-16 14:55 by Theodore Demark (NP)

## 2015-04-04 NOTE — Discharge Summary (Signed)
PATIENT NAME:  Randy Combs, Randy Combs MR#:  [id number] DATE OF BIRTH:  09-04-1966  DATE OF ADMISSION:  02/23/2015 DATE OF DISCHARGE:  02/26/2015  PRESENTING COMPLAINT: Shortness of breath, chest pressure and generalized weakness.   DISCHARGE DIAGNOSES: 1. Melena due to slow gastroinestinal bleed from peptic ulcer disease.  2. Iron deficiency/microcytic anemia.   PROCEDURES:  1. EGD showed lower third of the gastroesophageal junction was moderately tortuous, medium-sized hiatal hernia with multiple Cameron ulcers were found. Patchy inflammatory inflammation characterized by congestion and erythema found in the duodenal bulb.  2. Myoview stress test within normal limits.   CODE STATUS: Full code.   MEDICATIONS: 1. Lisinopril 20 mg p.o. daily.  2. Acetaminophen 325 mg 2 tablets every 6 hours as needed.  3. Carafate 1 gram daily.  4. Protonix 40 mg b.i.d.  5. Iron polysaccharide 200 mg p.o. daily.   FOLLOWUP:  1. With Dr. Gustavo Lah for 4 to 6 weeks.  2. With your primary care physician as needed.   CONSULTATION:  1. Dr. Gustavo Lah.  2. Dr. Ellyn Hack, cardiology.   LABORATORY DATA: Hemoglobin and hematocrit is 7.5 and 25.4. White count is 7.4. MCV is 68. Basic metabolic panel within normal limits. H. Pylori negative.  Myoview stress test was within normal limits. EF was 55%. EKG showed normal sinus rhythm.   BRIEF SUMMARY OF HOSPITAL COURSE: [name] is a 49 year old, obese, Caucasian gentleman with history of hypertension who came in with shortness of breath, weakness, chest tightness and noted to be severely anemic. He was admitted with:  1. Gastrointestinal bleed, slow, likely upper GI given the patient's melena for about a month using increasing amounts of Motrin and indomethacin for his gout. He is status post 2 units of blood transfusion. IV Protonix and Carafate was started. EGD was done by GI after he had obtained cardiology clearance by doing a Myoview stress test, which was  negative. The EGD shows results as above. Hemoglobin was 7.5 at discharge. No active bleeding. The patient was started on p.o. iron pills.  2. Anemia, microcytic due to slow GI bleed. Iron polysaccharide was started. He will follow up with Dr. Gustavo Lah as outpatient.  3. Hypertension. Resumed lisinopril.  4. History of gout. No acute attack. The patient was recommended to take Tylenol as needed.  5. Shortness of breath was suspected due to severe anemia. Myoview stress negative. Echocardiogram showed EF of 55%. No wall motion abnormality.   CODE STATUS: Full code.   TIME SPENT: 40 minutes.    ____________________________ Hart Rochester Posey Pronto, MD sap:TT D: 02/27/2015 06:50:07 ET T: 02/27/2015 13:23:26 ET JOB#: [id number]  cc: Scottlynn Lindell A. Posey Pronto, MD, <Dictator> Ilda Basset MD ELECTRONICALLY SIGNED 03/08/2015 12:24

## 2015-04-04 NOTE — Consult Note (Signed)
Chief Complaint:  Subjective/Chief Complaint seen for melena, profound anemia.  Feeling better today after tfx.  no bm or repeat evidence to gi bleeding today.  denies abd pain or nausea, tolerating clears.   VITAL SIGNS/ANCILLARY NOTES: **Vital Signs.:   23-Mar-16 12:46  Vital Signs Type Routine  Temperature Temperature (F) 98  Celsius 36.6  Pulse Pulse 72  Respirations Respirations 20  Systolic BP Systolic BP 093  Diastolic BP (mmHg) Diastolic BP (mmHg) 67  Mean BP 81  Pulse Ox % Pulse Ox % 99  Pulse Ox Activity Level  At rest  Oxygen Delivery Room Air/ 21 %   Brief Assessment:  Cardiac Regular   Respiratory clear BS   Gastrointestinal details normal Soft  Nontender  Nondistended  Bowel sounds normal   Lab Results:  Cardiology:  23-Mar-16 08:57   Echo Doppler REASON FOR EXAM:     COMMENTS:     PROCEDURE: [facility] - ECHO DOPPLER COMPLETE(TRANSTHOR)  - Feb 24 2015  8:57AM   RESULT: Echocardiogram Report  Patient Name:   Randy Combs Date of Exam: 02/24/2015 Medical Rec #:  [id number]             Custom1: Date of Birth:  November 08, 1966           Height:       70.0 in Patient Age:    49 years           Weight:       261.0 lb Patient Gender: M                  BSA:          2.34 m??  Indications: SOB Sonographer:    Sherrie Sport RDCS Referring Phys: Abel Presto, J  Summary:  1. Left ventricular ejection fraction, by visual estimation, is 60 to  65%.  2. Normal global left ventricular systolic function.  3. Impaired relaxation pattern of LV diastolic filling.  4. Mild concentric left ventricular hypertrophy.  5. Mildly increased left ventricular septal thickness.  6. Upper limit of normal left atrium.  7. Mildly increased left ventricular internal cavity size.  8. Pulmonary hypertension is absent.  9. Mildly increased left ventricular posterior wall thickness. 10. Overall Normal study with the exception of Mild Concentric LVH &  associated Grade 1 Diastolic  Dysfunction. 11. Prominent moderator band in RV. 12. Otherwise normal echocardiogram. 2D AND M-MODE MEASUREMENTS (normal ranges within parentheses): Left Ventricle:          Normal IVSd (2D):      1.24 cm (0.7-1.1) LVPWd (2D):     1.25 cm (0.7-1.1) Aorta/LA:                  Normal LVIDd (2D):     5.61 cm (3.4-5.7) Aortic Root (2D): 3.40 cm (2.4-3.7) LVIDs (2D):    3.64 cm           Left Atrium (2D): 3.10 cm (1.9-4.0) LV FS (2D):     35.1 %   (>25%) LV EF (2D):     63.8 %   (>50%)                                   Right Ventricle:  RVd (2D):        7.82 cm LV DIASTOLIC FUNCTION: MV Peak E: 0.58 m/s E/e' Ratio: 4.80 MV Peak A: 0.71 m/s Decel Time: 257 msec E/A Ratio: 0.81 SPECTRAL DOPPLER ANALYSIS (where applicable): Mitral Valve: MV P1/2 Time: 74.53 msec MV Area, PHT: 2.95 cm?? Aortic Valve: AoV Max Vel: 1.96m/s AoV Peak PG: 9.2 mmHg AoV Mean PG: LVOT Vmax: 1.19 m/s LVOT VTI:  LVOT Diameter: 2.10 cm AoV Area, Vmax: 2.71 cm?? AoV Area, VTI:  AoV Area, Vmn: Tricuspid Valve and PA/RV Systolic Pressure: TR Max Velocity: 1.95 m/s RA  Pressure: 5 mmHg RVSP/PASP: 20.2 mmHg Pulmonic Valve: PV Max Velocity: 1.12 m/s PV Max PG: 5.1 mmHg PV Mean PG:  PHYSICIAN INTERPRETATION: Left Ventricle: The left ventricular internal cavity size was mildly   increased. LV septal wall thickness was mildly increased. LV posterior  wall thickness was mildly increased. Mild concentric left ventricular  hypertrophy. The left ventricular hypertrophy involves all walls. Global  LV systolic function was normal. Left ventricular ejection fraction, by  visual estimation, is 60 to 65%. Spectral Doppler shows impaired  relaxation pattern of LV diastolic filling. Normal LV filling pressures.  LV Wall Scoring: All segments are normal. Right Ventricle: The right ventricle was not well seen. The right  ventricular size is normal. Global RV systolic function is normal.  Prominent  moderator band in RV. Left Atrium: The left atrium is upper limit of normal. Right Atrium: The right atrium is normal in size. Pericardium: There is no evidence of pericardial effusion. Mitral Valve: The mitral valve is normal in structure. There is mild. No  evidence of mitral valve stenosis. Trace mitral valve regurgitation is  seen. Mitral annular calcification. Tricuspid Valve: The tricuspid valve is normal. Trivial tricuspid  regurgitation is visualized. The tricuspid regurgitant velocity is 1.95  m/s, and with an assumed right atrial pressure of 5 mmHg, the estimated  right ventricular systolic pressure is normal at 20.2 mmHg. Aortic Valve: The aortic valve is normal. The aortic valve is  structurally normal, with no evidence of sclerosis or stenosis. Trivial  aortic valve regurgitation is seen. Pulmonic Valve: The pulmonic valve is not well seen. The pulmonic valve  is normal. Trace pulmonic valve regurgitation. Aorta: The aortic root and ascending aorta are structurally normal, with  no evidence of dilitation. Pulmonary Artery: Pulmonary hypertension is absent. Venous: The inferior vena cava was normal sized with respiratory size  variation greater than 50%.  [place] Electronically signed by [id number] Glenetta Hew Signature Date/Time: 02/24/2015/11:22:00 AM  *** Final ***  IMPRESSION: .    Verified By: Leonie Green. HARDING, M.D.,  Routine Coag:  22-Mar-16 10:33   INR 1.0 (INR reference interval applies to patients on anticoagulant therapy. A single INR therapeutic range for coumarins is not optimal for all indications; however, the suggested range for most indications is 2.0 - 3.0. Exceptions to the INR Reference Range may include: Prosthetic heart valves, acute myocardial infarction, prevention of myocardial infarction, and combinations of aspirin and anticoagulant. The need for a higher or lower target INR must be assessed individually. Reference: The  Pharmacology and Management of the Vitamin K  antagonists: the seventh ACCP Conference on Antithrombotic and Thrombolytic Therapy. HYQMV.7846 Sept:126 (3suppl): [id number]. A HCT value >55% may artifactually increase the PT.  In one study,  the increase was an average of 25%. Reference:  "Effect on Routine and Special Coagulation Testing Values of Citrate Anticoagulant Adjustment in Patients with High HCT Values." American Journal of  Clinical Pathology 2637;858:850-277.)  Routine Hem:  22-Mar-16 10:33   Platelet Count (CBC)  443   Radiology Results: Cardiology:    22-Mar-16 11:38, ED ECG  ECG interpretation   Normal sinus rhythm  Normal ECG  No previous ECGs available  ----------unconfirmed----------  Confirmed by OVERREAD, NOT ([id number]), editor PEARSON, BARBARA ([id number]) on 02/24/2015 10:20:14 AM    23-Mar-16 08:57, Echo Doppler  Echo Doppler   REASON FOR EXAM:      COMMENTS:       PROCEDURE: [facility] - ECHO DOPPLER COMPLETE(TRANSTHOR)  - Feb 24 2015  8:57AM     RESULT: Echocardiogram Report    Patient Name:   Randy Combs Date of Exam: 02/24/2015  Medical Rec #:  [id number]             Custom1:  Date of Birth:  10-04-1966           Height:       70.0 in  Patient Age:    83 years           Weight:       261.0 lb  Patient Gender: M                  BSA:          2.34 m??    Indications: SOB  Sonographer:    Sherrie Sport RDCS  Referring Phys: Abel Presto, J    Summary:   1. Left ventricular ejection fraction, by visual estimation, is 60 to   65%.   2. Normal global left ventricular systolic function.   3. Impaired relaxation pattern of LV diastolic filling.   4. Mild concentric left ventricular hypertrophy.   5. Mildly increased left ventricular septal thickness.   6. Upper limit of normal left atrium.   7. Mildly increased left ventricular internal cavity size.   8. Pulmonary hypertension is absent.   9. Mildly increased left ventricular posterior wall thickness.  10. Overall  Normal study with the exception of Mild Concentric LVH &   associated Grade 1 Diastolic Dysfunction.  11. Prominent moderator band in RV.  12. Otherwise normal echocardiogram.  2D AND M-MODE MEASUREMENTS (normal ranges within parentheses):  Left Ventricle:          Normal  IVSd (2D):      1.24 cm (0.7-1.1)  LVPWd (2D):     1.25 cm (0.7-1.1) Aorta/LA:                  Normal  LVIDd (2D):     5.61 cm (3.4-5.7) Aortic Root (2D): 3.40 cm (2.4-3.7)  LVIDs (2D):    3.64 cm           Left Atrium (2D): 3.10 cm (1.9-4.0)  LV FS (2D):     35.1 %   (>25%)  LV EF (2D):     63.8 %   (>50%)                                    Right Ventricle:                                    RVd (2D):        6.76 cm  LV DIASTOLIC FUNCTION:  MV Peak E: 0.58 m/s E/e' Ratio: 4.80  MV Peak A: 0.71 m/s Decel  Time: 257 msec  E/A Ratio: 0.81  SPECTRAL DOPPLER ANALYSIS (where applicable):  Mitral Valve:  MV P1/2 Time: 74.53 msec  MV Area, PHT: 2.95 cm??  Aortic Valve: AoV Max Vel: 1.29m/s AoV Peak PG: 9.2 mmHg AoV Mean PG:  LVOT Vmax: 1.19 m/s LVOT VTI:  LVOT Diameter: 2.10 cm  AoV Area, Vmax: 2.71 cm?? AoV Area, VTI:  AoV Area, Vmn:  Tricuspid Valve and PA/RV Systolic Pressure: TR Max Velocity: 1.95 m/s RA   Pressure: 5 mmHg RVSP/PASP: 20.2 mmHg  Pulmonic Valve:  PV Max Velocity: 1.12 m/s PV Max PG: 5.1 mmHg PV Mean PG:    PHYSICIAN INTERPRETATION:  Left Ventricle: The left ventricular internal cavity size was mildly     increased. LV septal wall thickness was mildly increased. LV posterior   wall thickness was mildly increased. Mild concentric left ventricular   hypertrophy. The left ventricular hypertrophy involves all walls. Global   LV systolic function was normal. Left ventricular ejection fraction, by   visual estimation, is 60 to 65%. Spectral Doppler shows impaired   relaxation pattern of LV diastolic filling. Normal LV filling pressures.    LV Wall Scoring:  All segments are normal.  Right Ventricle:  The right ventricle was not well seen. The right   ventricular size is normal. Global RV systolic function is normal.   Prominent moderator band in RV.  Left Atrium: The left atrium is upper limit of normal.  Right Atrium: The right atrium is normal in size.  Pericardium: There is no evidence of pericardial effusion.  Mitral Valve: The mitral valve is normal in structure. There is mild. No   evidence of mitral valve stenosis. Trace mitral valve regurgitation is   seen. Mitral annular calcification.  Tricuspid Valve: The tricuspid valve is normal. Trivial tricuspid   regurgitation is visualized. The tricuspid regurgitant velocity is 1.95   m/s, and with an assumed right atrial pressure of 5 mmHg, the estimated   right ventricular systolic pressure is normal at 20.2 mmHg.  Aortic Valve: The aortic valve is normal. The aortic valve is   structurally normal, with no evidence of sclerosis or stenosis. Trivial   aortic valve regurgitation is seen.  Pulmonic Valve: The pulmonic valve is not well seen. The pulmonic valve   is normal. Trace pulmonic valve regurgitation.  Aorta: The aortic root and ascending aorta are structurally normal, with   no evidence of dilitation.  Pulmonary Artery: Pulmonary hypertension is absent.  Venous: The inferior vena cava was normal sized with respiratory size   variation greater than 50%.    [place]  Electronically signed by [id number] Glenetta Hew  Signature Date/Time: 02/24/2015/11:22:00 AM    *** Final ***    IMPRESSION: .        Verified By: Leonie Green. HARDING, M.D.,   Assessment/Plan:  Assessment/Plan:  Assessment 1) melena, likely upper GI bleeding a/w nsaid use.  hemodynamically stable.  2) atypical chest pain 3) anemia, hgb now 7.6 after 2 unit prbc, BUN decreased to 15 from 19. may need another unit prbc.   Plan 1) awaiting myoview tomorrow.  If negative will proceed with EGD once results are available.  I have discussed the risks  benefits and complications of proceedure to include not limited to bleeding infection perforation and sedation and he wishes to proceed.  Patient will also need to have a colonoscopy when clinically feasible, can be done as o/p.   Electronic Signatures: Loistine Simas (MD)  (Signed 23-Mar-16 19:45)  Authored: Chief Complaint, VITAL SIGNS/ANCILLARY NOTES, Brief Assessment, Lab Results, Radiology Results, Assessment/Plan   Last Updated: 23-Mar-16 19:45 by Loistine Simas (MD)

## 2015-04-04 NOTE — Consult Note (Signed)
PATIENT NAME:  Randy Combs, CREMER MR#:  [id number] DATE OF BIRTH:  08/09/1966  DATE OF CONSULTATION:  02/23/2015  REFERRING PHYSICIAN:   CONSULTING PHYSICIAN:  Theodore Demark, NP  REASON FOR CONSULTATION: GI consult ordered by Dr. Verdell Carmine for evaluation of GI bleed.   HISTORY OF PRESENT ILLNESS: Appreciate consult for 49 year old Caucasian man admitted with weakness, shortness of breath, chest tightness. Found to be anemic with reports of melanotic stools, for evaluation of possible GI bleed. The patient reports onset of fatigue, weakness, shortness of breath, chest tightness over the last 2 to 3 weeks. Went to the Emergency Department in [place], was advised to have cardiology evaluation as outpatient. Saw Dr. Fletcher Anon, was to have cardiac catheterization tomorrow; however, states blood work drawn at this office revealed significant anemia and he was advised to present to the Emergency Department here, which he did. Hemoglobin was 6.4 on admission with a significant microcytosis and hyperchromia. Reports having black stools on and off over the last month. Been taking ibuprofen 800 mg p.o. daily for left knee pain, but says he was told by his orthopedic provided that he could take indomethacin, his gout medicine, instead, so he has been doing this on a daily basis. States he has had some vague intermittent epigastric discomfort, but denies significant abdominal pain, nausea, vomiting, diarrhea, dyspepsia, further GI complaints. Had EGD 7 years ago with hiatal hernia, but says he cannot remember the results. Not taking any PPI currently. Currently receiving packed red blood cells. States he is feeling some better. He is hemodynamically stable. His white count has been normal, platelets somewhat elevated. Liver panel has been normal. He is currently on Protonix drip. There is no history of colonoscopy. The patient now thinks his shortness of breath, chest tightness was related to his hemoglobin and not  his heart. His troponin on admission was normal.   REVIEW OF SYSTEMS: Ten systems reviewed, unremarkable other than what is noted above.   ALLERGIES: NKDA.   SOCIAL HISTORY: Quit smoking 2 weeks ago, used to drink alcohol heavily; however, has had none in 8 years. No illicits. Lives with his wife.   FAMILY HISTORY: No colorectal cancer, colon polyps, liver disease or ulcers.   CURRENT MEDICATIONS: ASA 81 mg daily, lisinopril 20 mg p.o. daily, indomethacin 50 mg t.i.d. p.r.n.   MOST RECENT LABORATORIES: Glucose 106 , BUN 19, creatinine 1.39, sodium 138, potassium 4.7. GFR 6, calcium 9.4. Liver panel normal. Troponin less than 0.03. WBC 6.6, hemoglobin 6.4, hematocrit 21.6, platelets 443,000, MCV low,  MCH 19.5, RDW increased. Ferritin 4, iron saturation 2%, iron 9, TIBC 463.   PHYSICAL EXAMINATION: VITAL SIGNS: Most recent, temperature 98.3, pulse 75, respiratory rate 20, blood pressure 115/65, SaO2 of 95% on room air.  GENERAL: Well-appearing, pleasant man resting in bed in no acute distress.  HEENT: Normocephalic, atraumatic. Conjunctivae pale pink. His sclerae are clear.  NECK: Supple. No JVD, lymphadenopathy, thyromegaly.  CARDIAC: S1, S2, RRR. No MRG. No edema.  CHEST: Respirations eupneic. Lungs clear.  ABDOMEN: Protuberant abdomen. Bowel sounds x 4. Nontender, nondistended. No guarding, rigidity, peritoneal signs or other abnormalities.  EXTREMITIES: Warm, dry, pink. No cyanosis, clubbing or edema. Strength 5/5.  NEUROLOGIC: Alert, oriented x 3. Cranial nerves II through XII intact.  SKIN: Warm, dry, pink. No erythema, lesion or rash.  PSYCHIATRIC: Pleasant, calm, cooperative, logical train of thought.   IMPRESSION AND PLAN: Anemia with iron deficiency and melena. History of nonsteroidal anti-inflammatory drug use. Suspect upper GI bleed. Agree  with transfusions following hemoglobin and proton pump inhibitor therapy. Likely will benefit from EGD for luminal evaluation if clinically  feasible. Have discussed this with Dr. Gustavo Lah. We recommend a cardiology consult prior to further GI evaluation due to his planned cardiac catheterization procedure tomorrow as an outpatient. Before any invasive tests he should also avoid nonsteroidal anti-inflammatory drugs, rationale discussed. He will need to be on proton pump inhibitor therapy for quite some time after hospitalization. We will follow with you. Thank you very much for this consult. These services were provided by Stephens November, MSN, [facility], in collaboration with Loistine Simas, MD, with whom I have discussed this patient in full.    ____________________________ Theodore Demark, NP chl:at D: 02/23/2015 17:32:53 ET T: 02/23/2015 18:06:41 ET JOB#: [id number]  cc: Theodore Demark, NP, <Dictator> [place] SIGNED 02/23/2015 19:27

## 2015-05-05 ENCOUNTER — Other Ambulatory Visit: Payer: Self-pay | Admitting: Gastroenterology

## 2015-05-05 DIAGNOSIS — K449 Diaphragmatic hernia without obstruction or gangrene: Secondary | ICD-10-CM

## 2015-05-10 ENCOUNTER — Ambulatory Visit: Payer: 59

## 2015-05-14 ENCOUNTER — Ambulatory Visit
Admission: RE | Admit: 2015-05-14 | Discharge: 2015-05-14 | Disposition: A | Discharge status: Home / Self Care | Payer: 59 | Source: Ambulatory Visit | Attending: Gastroenterology | Admitting: Gastroenterology

## 2015-05-14 DIAGNOSIS — K449 Diaphragmatic hernia without obstruction or gangrene: Secondary | ICD-10-CM | POA: Diagnosis not present

## 2015-05-14 DIAGNOSIS — D500 Iron deficiency anemia secondary to blood loss (chronic): Secondary | ICD-10-CM | POA: Diagnosis present

## 2015-08-05 ENCOUNTER — Inpatient Hospital Stay: Payer: 59 | Admitting: Cardiothoracic Surgery

## 2015-09-16 ENCOUNTER — Ambulatory Visit: Payer: 59 | Admitting: Cardiothoracic Surgery

## 2015-09-16 ENCOUNTER — Inpatient Hospital Stay: Payer: 59 | Attending: Cardiothoracic Surgery | Admitting: Cardiothoracic Surgery

## 2015-10-06 ENCOUNTER — Ambulatory Visit (INDEPENDENT_AMBULATORY_CARE_PROVIDER_SITE_OTHER): Payer: 59 | Admitting: Family Medicine

## 2015-10-06 ENCOUNTER — Encounter: Payer: Self-pay | Admitting: Family Medicine

## 2015-10-06 VITALS — BP 140/98 | HR 77 | Temp 98.3°F | Ht 70.0 in | Wt 261.5 lb

## 2015-10-06 DIAGNOSIS — M25562 Pain in left knee: Secondary | ICD-10-CM

## 2015-10-06 DIAGNOSIS — M109 Gout, unspecified: Secondary | ICD-10-CM

## 2015-10-06 DIAGNOSIS — M1009 Idiopathic gout, multiple sites: Secondary | ICD-10-CM

## 2015-10-06 DIAGNOSIS — M1000 Idiopathic gout, unspecified site: Secondary | ICD-10-CM | POA: Diagnosis not present

## 2015-10-06 MED ORDER — TRAMADOL HCL 50 MG PO TABS: 50.0000 mg | ORAL_TABLET | Freq: Four times a day (QID) | ORAL | Status: DC | PRN

## 2015-10-06 MED ORDER — ALLOPURINOL 100 MG PO TABS: 100.0000 mg | ORAL_TABLET | Freq: Two times a day (BID) | ORAL | Status: DC

## 2015-10-06 MED ORDER — METHYLPREDNISOLONE ACETATE 40 MG/ML IJ SUSP
80.0000 mg | Freq: Once | INTRAMUSCULAR | Status: AC
  Administered 2015-10-06: 80 mg via INTRA_ARTICULAR

## 2015-10-06 MED ORDER — PREDNISONE 20 MG PO TABS: 40.0000 mg | ORAL_TABLET | Freq: Every day | ORAL | Status: DC

## 2015-10-06 NOTE — Addendum Note (Signed)
Addended by: Carter Kitten on: 10/06/2015 09:01 AM   Modules accepted: Orders

## 2015-10-06 NOTE — Progress Notes (Signed)
Pre visit review using our clinic review tool, if applicable. No additional management support is needed unless otherwise documented below in the visit note. 

## 2015-10-06 NOTE — Progress Notes (Signed)
Dr. Frederico Hamman T. Devyn Sheerin, MD, [facility] [place], [address] Phone: [phone] Fax: [phone]  10/06/2015  Patient: Randy Combs, MRN: [id number], DOB: 07-May-1966, 49 y.o.  Primary Physician:  Owens Loffler, MD   Chief Complaint  Patient presents with  . Medication Problem    Wants to discuss changing Gout Medicine   Subjective:   Randy Combs is a 49 y.o. very pleasant male patient who presents with the following:  ? Change gout medicine.  Great toe and forefoot is quite swollen and TTP. Started last Wednesday - bad over the weekend. H/o ulcer. Had some colchicine, but it is not helping it.   L knee. Known torn meniscus. Has been to [facility].  Ongoing L knee pain.   Past Medical History, Surgical History, Social History, Family History, Problem List, Medications, and Allergies have been reviewed and updated if relevant.  Patient Active Problem List   Diagnosis Date Noted  . Anemia, iron deficiency 03/01/2015  . Acute upper GI bleed 03/01/2015  . Gout of multiple sites 03/01/2015  . Essential hypertension 03/04/2014    Past Medical History  Diagnosis Date  . Alcohol abuse, in remission     quit in 2008  . Gout   . Torn meniscus   . Tobacco abuse, in remission     Quit 2010--Dipped tin/day x 25 years  . Gout of multiple sites 03/01/2015    Past Surgical History  Procedure Laterality Date  . Appendectomy  1970's    Social History   Social History  . Marital Status: Married    Spouse Name: N/A  . Number of Children: N/A  . Years of Education: N/A   Occupational History  . Not on file.   Social History Main Topics  . Smoking status: Former Smoker -- 20 years    Types: Cigars    Quit date: 02/07/2013  . Smokeless tobacco: Current User  . Alcohol Use: No     Comment: Quit in 2008  . Drug Use: No  . Sexual Activity: Not Currently   Other Topics Concern  . Not on file     Social History Narrative   Grading work (runs equipment)      Married      Regular exercise      Quit alcohol;tobacco      No drugs    Family History  Problem Relation Age of Onset  . Lung cancer Mother   . Healthy Father   . Healthy Child   . Healthy Child   . Colon cancer Neg Hx   . Esophageal cancer Neg Hx     Allergies  Allergen Reactions  . Aspirin     Ulcer  . Ibuprofen     GI/ulcer  . Indomethacin     GI/ulcer    Medication list reviewed and updated in full in [facility].   GEN: No acute illnesses, no fevers, chills. GI: No n/v/d, eating normally Pulm: No SOB Interactive and getting along well at home.  Otherwise, ROS is as per the HPI.  Objective:   BP 140/98 mmHg  Pulse 77  Temp(Src) 98.3 F (36.8 C) (Oral)  Ht 5\' 10"  (1.778 m)  Wt 261 lb 8 oz (118.616 kg)  BMI 37.52 kg/m2   GEN: WDWN, NAD, Non-toxic, Alert & Oriented x 3 HEENT: Atraumatic, Normocephalic.  Ears and Nose: No external deformity. EXTR: No clubbing/cyanosis/edema NEURO: Normal gait.  PSYCH:  Normally interactive. Conversant. Not depressed or anxious appearing.  Calm demeanor.    L knee TTP with crepitus, TTP along joint line. ACL, PCL, MCL, LCL intact.   L MTP joint. Swollen, red.   Laboratory and Imaging Data:  Assessment and Plan:   Acute idiopathic gout of multiple sites  Podagra  Left knee pain  Prednisone x 1 week, then start allopurinol  Knee Injection, L Patient verbally consented to procedure. Risks (including potential rare risk of infection), benefits, and alternatives explained. Sterilely prepped with Chloraprep. Ethyl cholride used for anesthesia. 8 cc Lidocaine 1% mixed with 2 mL Depo-Medrol 40 mg injected using the anteromedial approach without difficulty. No complications with procedure and tolerated well. Patient had decreased pain post-injection.   Patient Instructions  Your GOUT has to be better to start the prophylactic medication. It  will get worse if it is not totally better when you start it.   Take 1 tablet a day for 1 week, then increase to 1 tablet 2 times a day     Follow-up: No Follow-up on file.  New Prescriptions   ALLOPURINOL (ZYLOPRIM) 100 MG TABLET    Take 1 tablet (100 mg total) by mouth 2 (two) times daily.   PREDNISONE (DELTASONE) 20 MG TABLET    Take 2 tablets (40 mg total) by mouth daily with breakfast.   TRAMADOL (ULTRAM) 50 MG TABLET    Take 1 tablet (50 mg total) by mouth every 6 (six) hours as needed.    Signed,  Maud Deed. Elaisha Zahniser, MD   Patient's Medications  New Prescriptions   ALLOPURINOL (ZYLOPRIM) 100 MG TABLET    Take 1 tablet (100 mg total) by mouth 2 (two) times daily.   PREDNISONE (DELTASONE) 20 MG TABLET    Take 2 tablets (40 mg total) by mouth daily with breakfast.   TRAMADOL (ULTRAM) 50 MG TABLET    Take 1 tablet (50 mg total) by mouth every 6 (six) hours as needed.  Previous Medications   ACETAMINOPHEN (TYLENOL) 325 MG TABLET    Take 650 mg by mouth every 4 (four) hours as needed (for fever over 100.4).   COLCHICINE 0.6 MG TABLET    Take 1 tablet (0.6 mg total) by mouth 2 (two) times daily.   IRON PO    Take 200 mg by mouth daily.   LISINOPRIL (PRINIVIL,ZESTRIL) 20 MG TABLET    Take 1 tablet (20 mg total) by mouth daily.   PANTOPRAZOLE (PROTONIX) 40 MG TABLET    Take 40 mg by mouth 2 (two) times daily.   SUCRALFATE (CARAFATE) 1 G TABLET    Take 1 g by mouth 4 (four) times daily as needed.  Modified Medications   No medications on file  Discontinued Medications   No medications on file

## 2015-10-06 NOTE — Patient Instructions (Signed)
Your GOUT has to be better to start the prophylactic medication. It will get worse if it is not totally better when you start it.   Take 1 tablet a day for 1 week, then increase to 1 tablet 2 times a day

## 2015-10-11 ENCOUNTER — Encounter: Payer: Self-pay | Admitting: Gastroenterology

## 2015-10-18 ENCOUNTER — Encounter: Payer: Self-pay | Admitting: Family Medicine

## 2015-10-18 ENCOUNTER — Ambulatory Visit (INDEPENDENT_AMBULATORY_CARE_PROVIDER_SITE_OTHER): Payer: Commercial Managed Care - HMO | Admitting: Family Medicine

## 2015-10-18 VITALS — BP 146/86 | HR 96 | Temp 99.1°F | Ht 70.0 in | Wt 260.5 lb

## 2015-10-18 DIAGNOSIS — D509 Iron deficiency anemia, unspecified: Secondary | ICD-10-CM | POA: Diagnosis not present

## 2015-10-18 DIAGNOSIS — M1009 Idiopathic gout, multiple sites: Secondary | ICD-10-CM | POA: Diagnosis not present

## 2015-10-18 LAB — CBC WITH DIFFERENTIAL/PLATELET
BASOS PCT: 0.2 % (ref 0.0–3.0)
Basophils Absolute: 0 10*3/uL (ref 0.0–0.1)
EOS ABS: 0.1 10*3/uL (ref 0.0–0.7)
EOS PCT: 0.6 % (ref 0.0–5.0)
HEMATOCRIT: 46.2 % (ref 39.0–52.0)
HEMOGLOBIN: 15.5 g/dL (ref 13.0–17.0)
LYMPHS PCT: 12.6 % (ref 12.0–46.0)
Lymphs Abs: 1.7 10*3/uL (ref 0.7–4.0)
MCHC: 33.5 g/dL (ref 30.0–36.0)
MCV: 93.5 fl (ref 78.0–100.0)
Monocytes Absolute: 1.2 10*3/uL — ABNORMAL HIGH (ref 0.1–1.0)
Monocytes Relative: 8.5 % (ref 3.0–12.0)
Neutro Abs: 10.8 10*3/uL — ABNORMAL HIGH (ref 1.4–7.7)
Neutrophils Relative %: 78.1 % — ABNORMAL HIGH (ref 43.0–77.0)
Platelets: 262 10*3/uL (ref 150.0–400.0)
RBC: 4.94 Mil/uL (ref 4.22–5.81)
RDW: 15.1 % (ref 11.5–15.5)
WBC: 13.8 10*3/uL — AB (ref 4.0–10.5)

## 2015-10-18 MED ORDER — OMEPRAZOLE 20 MG PO CPDR: 20.0000 mg | DELAYED_RELEASE_CAPSULE | Freq: Every day | ORAL | Status: DC

## 2015-10-18 MED ORDER — CELECOXIB 200 MG PO CAPS: 200.0000 mg | ORAL_CAPSULE | Freq: Every day | ORAL | Status: DC

## 2015-10-18 MED ORDER — COLCHICINE 0.6 MG PO TABS: 0.6000 mg | ORAL_TABLET | Freq: Two times a day (BID) | ORAL | Status: DC

## 2015-10-18 NOTE — Progress Notes (Signed)
Pre visit review using our clinic review tool, if applicable. No additional management support is needed unless otherwise documented below in the visit note. 

## 2015-10-18 NOTE — Progress Notes (Signed)
Dr. Frederico Hamman T. Robel Wuertz, MD, [facility] [place], [address] Phone: [phone] Fax: [phone]  10/18/2015  Patient: Randy Combs, MRN: [id number], DOB: 1966/07/24, 49 y.o.  Primary Physician:  Owens Loffler, MD   Chief Complaint  Patient presents with  . Follow-up    Foot Pain-Left   Subjective:   Randy Combs is a 49 y.o. very pleasant male patient who presents with the following:  He continues to do poorly. He done some oral prednisone 12 days ago, but this did not really seem to help his symptoms. He did have a GI bleed with an ulcer back about 8 or 9 months ago. At that point he was admitted with a hemoglobin of 6 and was transfused 2 units of packed red blood cells. He had been taking a lot of indomethacin at that point.  This time, he has not tried taking his colchicine at all. Any never restarted or never tried the allopurinol.  10/06/2015 Last OV with Owens Loffler, MD  ? Change gout medicine.  Great toe and forefoot is quite swollen and TTP. Started last Wednesday - bad over the weekend. H/o ulcer. Had some colchicine, but it is not helping it.   L knee. Known torn meniscus. Has been to [facility].  Ongoing L knee pain.   Past Medical History, Surgical History, Social History, Family History, Problem List, Medications, and Allergies have been reviewed and updated if relevant.  Patient Active Problem List   Diagnosis Date Noted  . Anemia, iron deficiency 03/01/2015  . Acute upper GI bleed 03/01/2015  . Gout of multiple sites 03/01/2015  . Essential hypertension 03/04/2014    Past Medical History  Diagnosis Date  . Alcohol abuse, in remission     quit in 2008  . Gout   . Torn meniscus   . Tobacco abuse, in remission     Quit 2010--Dipped tin/day x 25 years  . Gout of multiple sites 03/01/2015    Past Surgical History  Procedure Laterality Date  . Appendectomy  1970's     Social History   Social History  . Marital Status: Married    Spouse Name: N/A  . Number of Children: N/A  . Years of Education: N/A   Occupational History  . Not on file.   Social History Main Topics  . Smoking status: Former Smoker -- 20 years    Types: Cigars    Quit date: 02/07/2013  . Smokeless tobacco: Current User  . Alcohol Use: No     Comment: Quit in 2008  . Drug Use: No  . Sexual Activity: Not Currently   Other Topics Concern  . Not on file   Social History Narrative   Grading work (runs equipment)      Married      Regular exercise      Quit alcohol;tobacco      No drugs    Family History  Problem Relation Age of Onset  . Lung cancer Mother   . Healthy Father   . Healthy Child   . Healthy Child   . Colon cancer Neg Hx   . Esophageal cancer Neg Hx     Allergies  Allergen Reactions  . Aspirin     Ulcer  . Ibuprofen     GI/ulcer  . Indomethacin     GI/ulcer    Medication list reviewed and updated in full in [facility].  GEN: No acute illnesses, no fevers, chills. GI: No n/v/d, eating normally Pulm: No SOB Interactive and getting along well at home.  Otherwise, ROS is as per the HPI.  Objective:   BP 146/86 mmHg  Pulse 96  Temp(Src) 99.1 F (37.3 C) (Oral)  Ht 5\' 10"  (1.778 m)  Wt 260 lb 8 oz (118.162 kg)  BMI 37.38 kg/m2   GEN: WDWN, NAD, Non-toxic, Alert & Oriented x 3 HEENT: Atraumatic, Normocephalic.  Ears and Nose: No external deformity. EXTR: No clubbing/cyanosis/edema NEURO: Normal gait.  PSYCH: Normally interactive. Conversant. Not depressed or anxious appearing.  Calm demeanor.    L knee TTP with crepitus, TTP along joint line. ACL, PCL, MCL, LCL intact.   L MTP joint and entire forefoot, 2-4 MTP. Swollen, red.   Laboratory and Imaging Data:  Assessment and Plan:   Acute idiopathic gout of multiple sites  Anemia, iron deficiency - Plan: CBC with Differential/Platelet  Celebrex + PPI for ulcer  prevention Colchicine high dose  When able, start allopurinol  Refer to the patient instructions sections for details of plan shared with patient.   Follow-up: No Follow-up on file.   Patient Instructions  Colchicine: 2 tablets right when you get the prescription, then 1 tablet every 2 hours until gout better or you get diarrhea.   Continue on 1 tablet twice a day after then.    Celebrex 200 mg, 1 tablet a day    New Prescriptions   CELECOXIB (CELEBREX) 200 MG CAPSULE    Take 1 capsule (200 mg total) by mouth daily.   OMEPRAZOLE (PRILOSEC) 20 MG CAPSULE    Take 1 capsule (20 mg total) by mouth daily.   Modified Medications   Modified Medication Previous Medication   COLCHICINE 0.6 MG TABLET colchicine 0.6 MG tablet      Take 1 tablet (0.6 mg total) by mouth 2 (two) times daily.    Take 1 tablet (0.6 mg total) by mouth 2 (two) times daily.   Orders Placed This Encounter  Procedures  . CBC with Differential/Platelet    Signed,  Frederico Hamman T. Keyshaun Exley, MD   Patient's Medications  New Prescriptions   CELECOXIB (CELEBREX) 200 MG CAPSULE    Take 1 capsule (200 mg total) by mouth daily.   OMEPRAZOLE (PRILOSEC) 20 MG CAPSULE    Take 1 capsule (20 mg total) by mouth daily.  Previous Medications   ACETAMINOPHEN (TYLENOL) 325 MG TABLET    Take 650 mg by mouth every 4 (four) hours as needed (for fever over 100.4).   ALLOPURINOL (ZYLOPRIM) 100 MG TABLET    Take 1 tablet (100 mg total) by mouth 2 (two) times daily.   TRAMADOL (ULTRAM) 50 MG TABLET    Take 1 tablet (50 mg total) by mouth every 6 (six) hours as needed.  Modified Medications   Modified Medication Previous Medication   COLCHICINE 0.6 MG TABLET colchicine 0.6 MG tablet      Take 1 tablet (0.6 mg total) by mouth 2 (two) times daily.    Take 1 tablet (0.6 mg total) by mouth 2 (two) times daily.  Discontinued Medications   IRON PO    Take 200 mg by mouth daily.   LISINOPRIL (PRINIVIL,ZESTRIL) 20 MG TABLET    Take 1 tablet  (20 mg total) by mouth daily.   PANTOPRAZOLE (PROTONIX) 40 MG TABLET    Take 40 mg by mouth 2 (two) times daily.   PREDNISONE (DELTASONE) 20 MG TABLET    Take 2  tablets (40 mg total) by mouth daily with breakfast.   SUCRALFATE (CARAFATE) 1 G TABLET    Take 1 g by mouth 4 (four) times daily as needed.

## 2015-10-18 NOTE — Patient Instructions (Signed)
Colchicine: 2 tablets right when you get the prescription, then 1 tablet every 2 hours until gout better or you get diarrhea.   Continue on 1 tablet twice a day after then.    Celebrex 200 mg, 1 tablet a day

## 2016-01-13 ENCOUNTER — Other Ambulatory Visit: Payer: Self-pay | Admitting: Gastroenterology

## 2016-01-13 ENCOUNTER — Telehealth: Payer: Self-pay

## 2016-01-13 DIAGNOSIS — K449 Diaphragmatic hernia without obstruction or gangrene: Secondary | ICD-10-CM

## 2016-01-13 NOTE — Telephone Encounter (Signed)
Randy Combs at [facility] said pt was in her office with hiatal hernia episode and intermittent CP and SOB; pt is going to see Dr Fletcher Anon 01/14/16. Pt stopped lisinopril several weeks ago because he was feeling better. Today BP 163-168/112-118. Randy Combs wants to know if OK for her to tell pt to restart Lisinopril. Dr Diona Browner advised to restart Lisinopril 20 mg taking one daily. Randy Combs said pt has plenty of med at home and does not need refill and will instruct pt to contact [facility] if BP does not go down. FYI to Dr Diona Browner.

## 2016-01-14 ENCOUNTER — Encounter: Payer: Self-pay | Admitting: Cardiovascular Disease

## 2016-01-14 ENCOUNTER — Ambulatory Visit
Admission: RE | Admit: 2016-01-14 | Discharge: 2016-01-14 | Disposition: A | Discharge status: Home / Self Care | Payer: Commercial Managed Care - HMO | Source: Ambulatory Visit | Attending: Cardiovascular Disease | Admitting: Cardiovascular Disease

## 2016-01-14 ENCOUNTER — Ambulatory Visit (INDEPENDENT_AMBULATORY_CARE_PROVIDER_SITE_OTHER): Payer: Commercial Managed Care - HMO | Admitting: Cardiovascular Disease

## 2016-01-14 VITALS — BP 148/90 | HR 80 | Ht 70.0 in | Wt 267.0 lb

## 2016-01-14 DIAGNOSIS — R079 Chest pain, unspecified: Secondary | ICD-10-CM | POA: Diagnosis not present

## 2016-01-14 DIAGNOSIS — I10 Essential (primary) hypertension: Secondary | ICD-10-CM

## 2016-01-14 NOTE — Patient Instructions (Signed)
Medication Instructions:  Your physician recommends that you continue on your current medications as directed. Please refer to the Current Medication list given to you today.   Labwork: none  Testing/Procedures: A chest x-ray takes a picture of the organs and structures inside the chest, including the heart, lungs, and blood vessels. This test can show several things, including, whether the heart is enlarges; whether fluid is building up in the lungs; and whether pacemaker / defibrillator leads are still in place.   Follow-Up: Your physician recommends that you schedule a follow-up appointment in: two months with Dr. Fletcher Anon.    Any Other Special Instructions Will Be Listed Below (If Applicable).     If you need a refill on your cardiac medications before your next appointment, please call your pharmacy.

## 2016-01-14 NOTE — Progress Notes (Signed)
Primary care physician: Dr. Lorelei Pont  HPI  Randy Combs is a 50 y.o. very pleasant male patient who is here today for a follow-up visit regarding chest pain. He has known history of tobacco use, obesity , anemia, hiatal hernia and hypertension. He was seen last year by me for severe exertional chest pain and shortness of breath which was anginal in nature. Thus, heart catheterization was recommended. However, his precath labs showed severe anemia with a hemoglobin of 6.4. Thus, cardiac catheterization was canceled and the patient was hospitalized where he underwent further GI evaluation and transfusion. He underwent a pharmacologic nuclear stress test which showed no evidence of ischemia with normal ejection fraction. The patient was diagnosed with large hiatal hernia causing some of his chest pain. He is having some substernal chest pain which he thinks related to this. He also has another kind of chest pain in the left substernal area where he recently felt a "knott". He has no exertional chest pain but does complain of exertional dyspnea. He does complain of heavy sweats at night with poor quality sleep. He has not had a sleep study done. He was noted to have elevated blood pressure lately and he resumed taking lisinopril yesterday.  Allergies  Allergen Reactions  . Aspirin     Ulcer  . Ibuprofen     GI/ulcer  . Indomethacin     GI/ulcer     Current Outpatient Prescriptions on File Prior to Visit  Medication Sig Dispense Refill  . acetaminophen (TYLENOL) 325 MG tablet Take 650 mg by mouth every 4 (four) hours as needed (for fever over 100.4).    . celecoxib (CELEBREX) 200 MG capsule Take 1 capsule (200 mg total) by mouth daily. 30 capsule 2  . colchicine 0.6 MG tablet Take 1 tablet (0.6 mg total) by mouth 2 (two) times daily. 60 tablet 3  . omeprazole (PRILOSEC) 20 MG capsule Take 1 capsule (20 mg total) by mouth daily. 30 capsule 11  . traMADol (ULTRAM) 50 MG tablet Take 1 tablet  (50 mg total) by mouth every 6 (six) hours as needed. 50 tablet 2   No current facility-administered medications on file prior to visit.     Past Medical History  Diagnosis Date  . Alcohol abuse, in remission     quit in 2008  . Gout   . Torn meniscus   . Tobacco abuse, in remission     Quit 2010--Dipped tin/day x 25 years  . Gout of multiple sites 03/01/2015  . Hiatal hernia      Past Surgical History  Procedure Laterality Date  . Appendectomy  1970's     Family History  Problem Relation Age of Onset  . Lung cancer Mother   . Healthy Father   . Healthy Child   . Healthy Child   . Colon cancer Neg Hx   . Esophageal cancer Neg Hx      Social History   Social History  . Marital Status: Married    Spouse Name: N/A  . Number of Children: N/A  . Years of Education: N/A   Occupational History  . Not on file.   Social History Main Topics  . Smoking status: Current Some Day Smoker -- 20 years    Types: Cigars    Last Attempt to Quit: 02/07/2013  . Smokeless tobacco: Current User  . Alcohol Use: No     Comment: Quit in 2008  . Drug Use: No  . Sexual Activity: Not Currently  Other Topics Concern  . Not on file   Social History Narrative   Grading work (runs equipment)      Married      Regular exercise      Quit alcohol;tobacco      No drugs     ROS A 10 point review of system was performed. It is negative other than that mentioned in the history of present illness.   PHYSICAL EXAM   BP 148/90 mmHg  Pulse 80  Ht 5\' 10"  (1.778 m)  Wt 267 lb (121.11 kg)  BMI 38.31 kg/m2 Constitutional: He is oriented to person, place, and time. He appears well-developed and well-nourished. No distress.  HENT: No nasal discharge.  Head: Normocephalic and atraumatic.  Eyes: Pupils are equal and round.  No discharge. Neck: Normal range of motion. Neck supple. No JVD present. No thyromegaly present.  Cardiovascular: Normal rate, regular rhythm, normal heart  sounds. Exam reveals no gallop and no friction rub. No murmur heard.  Pulmonary/Chest: Effort normal and breath sounds normal. No stridor. No respiratory distress. He has no wheezes. He has no rales. He exhibits no tenderness.  Abdominal: Soft. Bowel sounds are normal. He exhibits no distension. There is no tenderness. There is no rebound and no guarding.  Musculoskeletal: Normal range of motion. He exhibits no edema and no tenderness.  Neurological: He is alert and oriented to person, place, and time. Coordination normal.  Skin: Skin is warm and dry. No rash noted. He is not diaphoretic. No erythema. No pallor.  Psychiatric: He has a normal mood and affect. His behavior is normal. Judgment and thought content normal.       EKG: Sinus  Rhythm  Right bundle branch block.   ASSESSMENT AND PLAN

## 2016-01-14 NOTE — Assessment & Plan Note (Signed)
His current chest pain is overall atypical and I doubt that it is cardiac. He had nuclear stress test done last year which was unremarkable. I do feel a "knott" in the left substernal area which is very tender. I requested a chest x-ray. I explained to the patient that he needs to notify us or his primary care physician if this does not resolve in few weeks as he might require musculoskeletal CT scan. His other symptoms are suggestive of a GI etiology. He is going to undergo further GI evaluation. His EKG does show right bundle branch block which is new but this is overall a benign finding.

## 2016-01-14 NOTE — Assessment & Plan Note (Signed)
Blood pressure is mildly elevated. I agree with resuming lisinopril.

## 2016-01-18 ENCOUNTER — Other Ambulatory Visit: Payer: Self-pay | Admitting: Family Medicine

## 2016-01-21 ENCOUNTER — Ambulatory Visit
Admission: RE | Admit: 2016-01-21 | Discharge: 2016-01-21 | Disposition: A | Discharge status: Home / Self Care | Payer: Commercial Managed Care - HMO | Source: Ambulatory Visit | Attending: Gastroenterology | Admitting: Gastroenterology

## 2016-01-21 DIAGNOSIS — K449 Diaphragmatic hernia without obstruction or gangrene: Secondary | ICD-10-CM | POA: Diagnosis not present

## 2016-01-21 DIAGNOSIS — R1013 Epigastric pain: Secondary | ICD-10-CM | POA: Diagnosis present

## 2016-01-21 DIAGNOSIS — K219 Gastro-esophageal reflux disease without esophagitis: Secondary | ICD-10-CM | POA: Diagnosis not present

## 2016-01-21 DIAGNOSIS — I10 Essential (primary) hypertension: Secondary | ICD-10-CM | POA: Insufficient documentation

## 2016-02-07 IMAGING — DX DG CHEST 2V
2 series · 2 of 2 positions shown · non-contrast
Comparison: None.

CLINICAL DATA: Mid chest pain with shortness of breath for the past
3 weeks.

EXAM:
CHEST  2 VIEW

[chest pa]
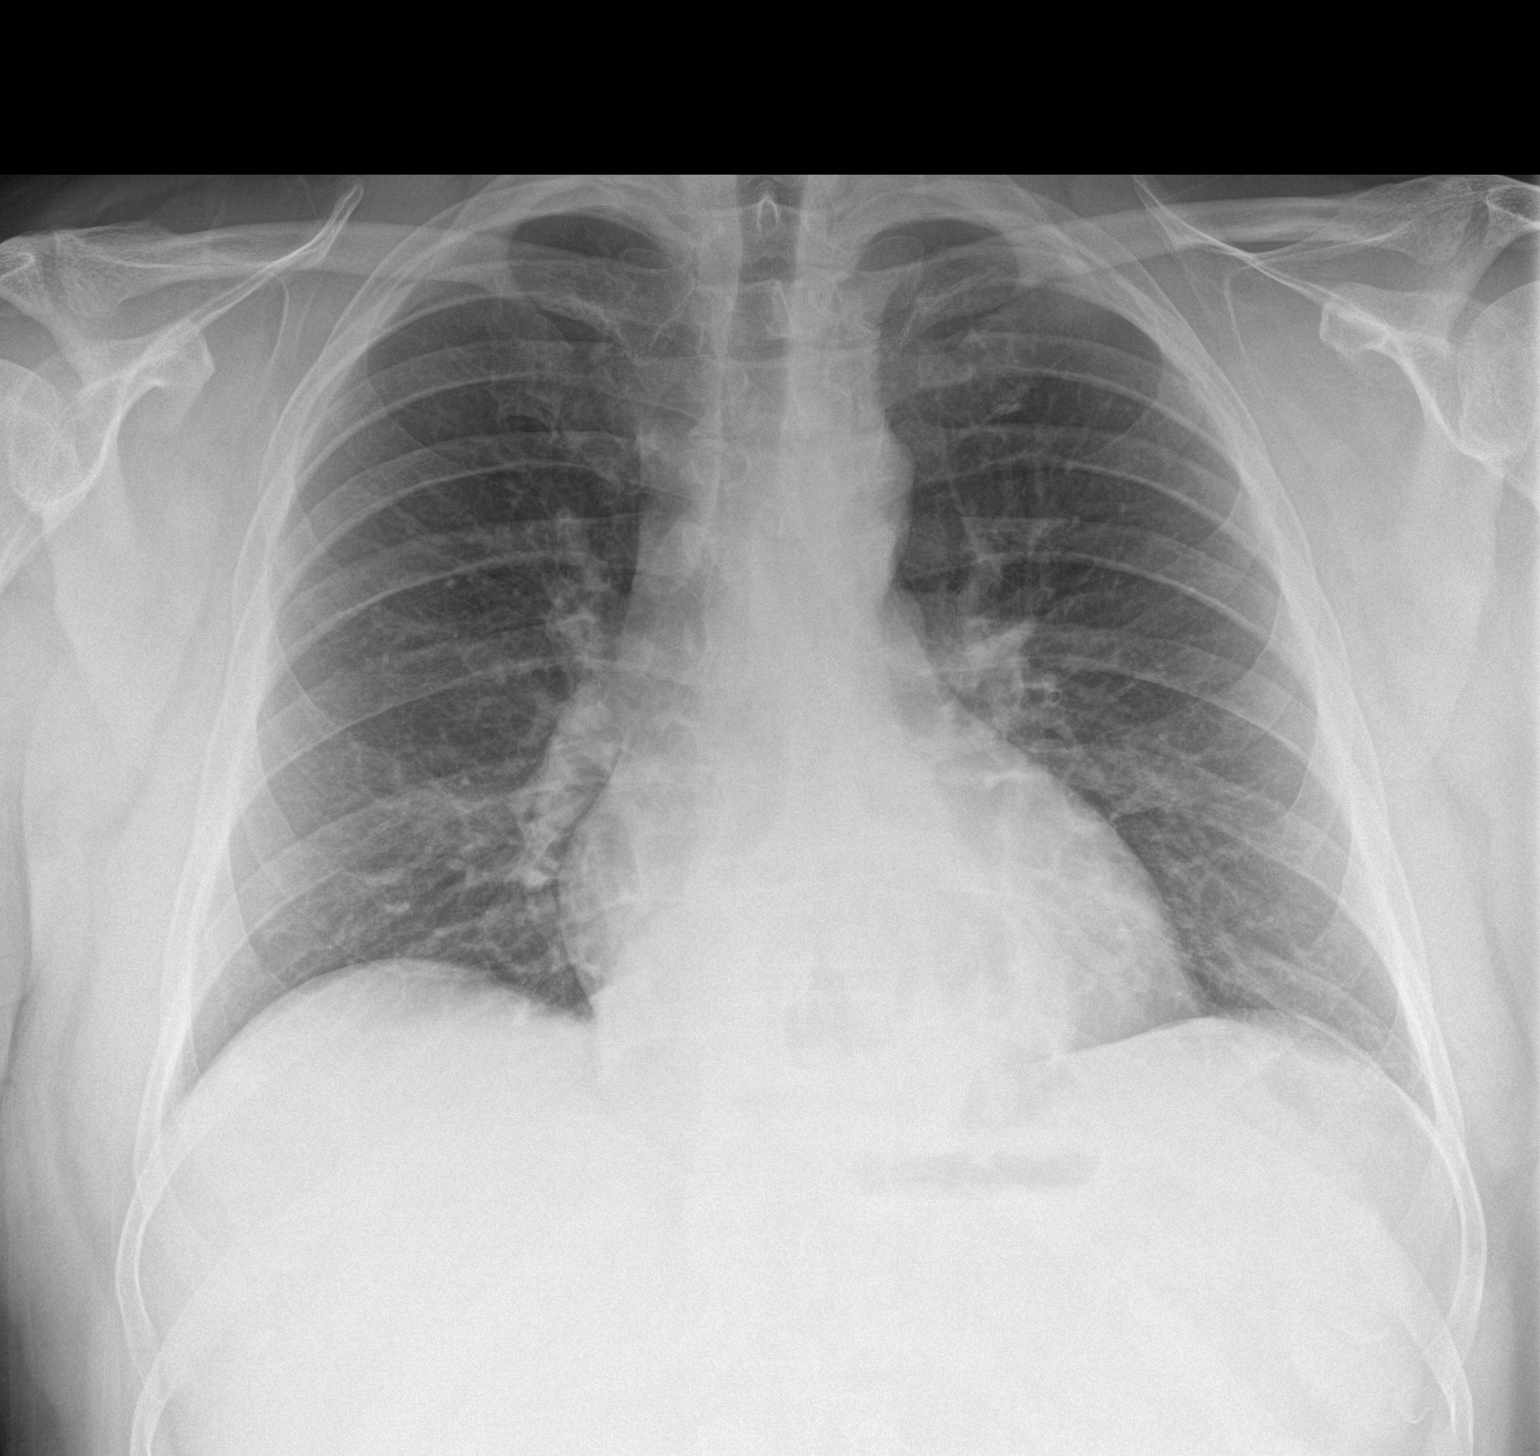

[chest lat]
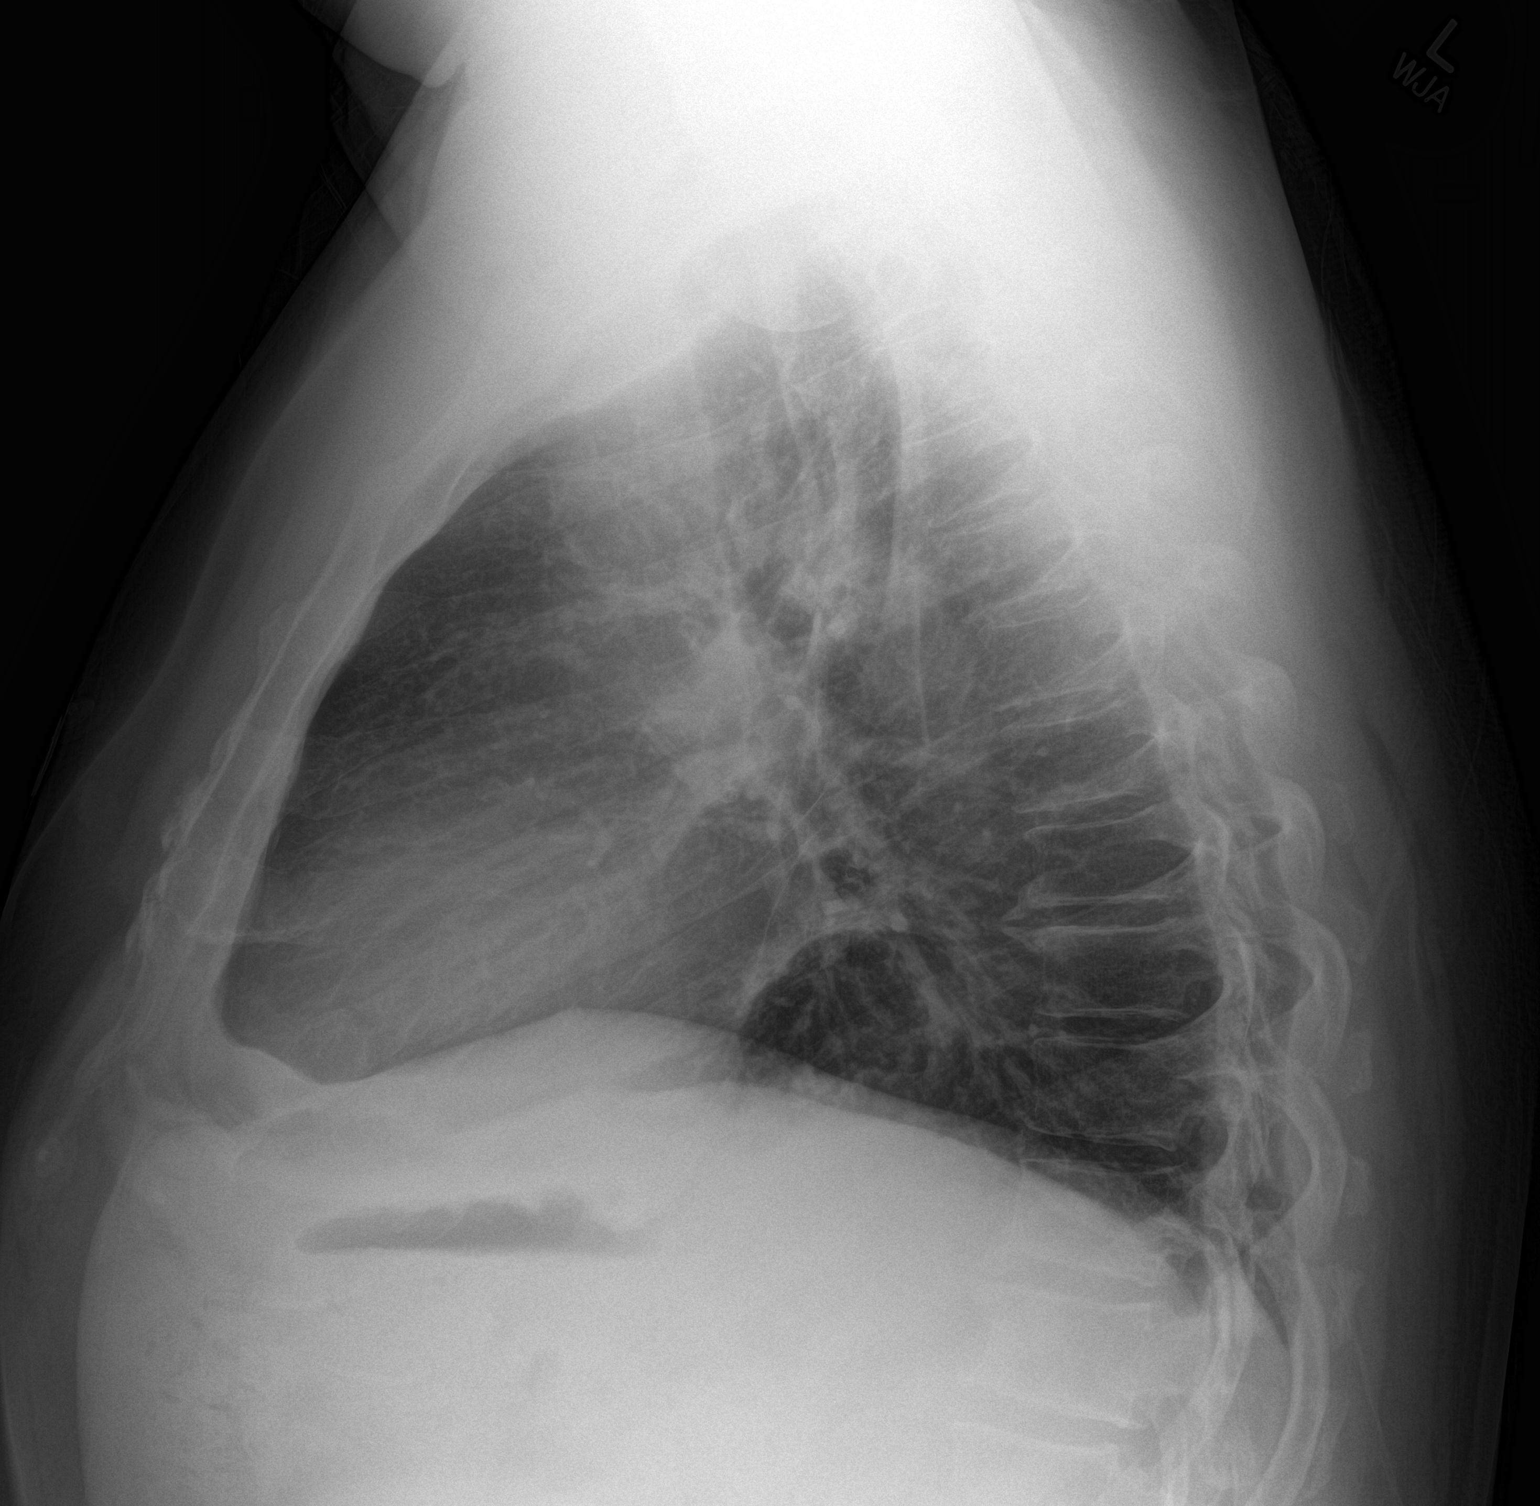

[2 of 2 positions shown; findings below may reference images not displayed]

FINDINGS: Normal heart size and pulmonary vascularity. No focal airspace
disease or consolidation in the lungs. No blunting of costophrenic
angles. No pneumothorax. Mediastinal contours appear intact.
Esophageal hiatal hernia behind the heart. Degenerative changes in
the spine.
IMPRESSION: No active cardiopulmonary disease.

## 2016-02-15 ENCOUNTER — Telehealth: Payer: Self-pay | Admitting: Family Medicine

## 2016-02-15 NOTE — Telephone Encounter (Signed)
Please call and schedule CPE with fasting labs prior for Dr. Copland.  

## 2016-02-15 NOTE — Telephone Encounter (Signed)
Pt wanted dot physical.  When i don't him we didn't do dot cpx.  He stated he would get his cpx with his dot physcial Please close

## 2016-02-23 ENCOUNTER — Other Ambulatory Visit: Payer: Self-pay

## 2016-02-23 MED ORDER — TRAMADOL HCL 50 MG PO TABS: 50.0000 mg | ORAL_TABLET | Freq: Four times a day (QID) | ORAL | Status: DC | PRN

## 2016-02-23 NOTE — Telephone Encounter (Signed)
Tramadol called into [facility] [address] 

## 2016-02-23 NOTE — Telephone Encounter (Signed)
Pt left v/m requesting refill tramadol to [facility] [address] Last refilled # 50 x 2 on 10/06/15. Last seen 10/18/15.

## 2016-02-23 NOTE — Telephone Encounter (Signed)
Ok to ref, 92, 2 ref

## 2016-02-29 ENCOUNTER — Encounter: Payer: Self-pay | Admitting: General Surgery

## 2016-03-09 ENCOUNTER — Encounter: Payer: Self-pay | Admitting: General Surgery

## 2016-03-09 ENCOUNTER — Ambulatory Visit (INDEPENDENT_AMBULATORY_CARE_PROVIDER_SITE_OTHER): Payer: Commercial Managed Care - HMO | Admitting: General Surgery

## 2016-03-09 VITALS — BP 160/90 | HR 82 | Resp 16 | Ht 70.0 in

## 2016-03-09 DIAGNOSIS — K449 Diaphragmatic hernia without obstruction or gangrene: Secondary | ICD-10-CM | POA: Insufficient documentation

## 2016-03-09 NOTE — Progress Notes (Signed)
Patient ID: Randy Combs, male   DOB: 07/04/1966, 50 y.o.   MRN: [id number]  Chief Complaint  Patient presents with  . Other    hiatal hernia    HPI Randy Combs is a 50 y.o. male here today for a evaluation of a symptomatic hiatal hernia . Patient had a upper endoscopy on 02/25/15. He states he is having a lot of acid reflex and trouble swallowing. The patient presented to the hospital in March 2016 with profound anemia. He was found have multiple ulcers in the herniated portion of the stomach above the level of the diaphragm. He is color blind, and it was unaware that his stools had become black or that he had been vomiting blood. The patient has noted decreased exercise tolerance, although laboratory studies obtained just last month showed a normal hemoglobin.  Recently completed UGI of February 17 showed a large hiatal hernia with partial reduction of the stomach. Transient hang up of barium tablet cleared with one additional swallow. HPI  Past Medical History  Diagnosis Date  . Alcohol abuse, in remission     quit in 2008  . Gout   . Torn meniscus   . Tobacco abuse, in remission     Quit 2010--Dipped tin/day x 25 years  . Gout of multiple sites 03/01/2015  . Hiatal hernia   . Bleeding ulcer     Past Surgical History  Procedure Laterality Date  . Appendectomy  1970's    Family History  Problem Relation Age of Onset  . Lung cancer Mother   . Healthy Father   . Healthy Child   . Healthy Child   . Colon cancer Neg Hx   . Esophageal cancer Neg Hx     Social History Social History  Substance Use Topics  . Smoking status: Current Some Day Smoker -- 20 years    Types: Cigars    Last Attempt to Quit: 02/07/2013  . Smokeless tobacco: Current User  . Alcohol Use: No     Comment: Quit in 2008    Allergies  Allergen Reactions  . Aspirin     Ulcer  . Ibuprofen     GI/ulcer  . Indomethacin     GI/ulcer    Current Outpatient Prescriptions  Medication Sig  Dispense Refill  . acetaminophen (TYLENOL) 325 MG tablet Take 650 mg by mouth every 4 (four) hours as needed (for fever over 100.4).    . celecoxib (CELEBREX) 200 MG capsule TAKE 1 CAPSULE EVERY DAY 30 capsule 2  . colchicine 0.6 MG tablet Take 1 tablet (0.6 mg total) by mouth 2 (two) times daily. 60 tablet 3  . lisinopril (PRINIVIL,ZESTRIL) 20 MG tablet TAKE 1 TABLET BY MOUTH EVERY DAY 90 tablet 0  . omeprazole (PRILOSEC) 20 MG capsule Take 1 capsule (20 mg total) by mouth daily. 30 capsule 11  . sucralfate (CARAFATE) 1 g tablet Take 1 g by mouth 4 (four) times daily -  with meals and at bedtime.     No current facility-administered medications for this visit.    Review of Systems Review of Systems  Constitutional: Negative.   HENT: Positive for trouble swallowing.   Respiratory: Negative.   Cardiovascular: Negative.     Blood pressure 160/90, pulse 82, resp. rate 16, height 5\' 10"  (1.778 m).  Physical Exam Physical Exam  Constitutional: He is oriented to person, place, and time. He appears well-developed and well-nourished.  Eyes: Conjunctivae are normal. No scleral icterus.  Neck: Neck supple.  Cardiovascular: Normal rate, regular rhythm and normal heart sounds.   Pulmonary/Chest: Effort normal and breath sounds normal.  Lymphadenopathy:    He has no cervical adenopathy.  Neurological: He is alert and oriented to person, place, and time.  Skin: Skin is warm and dry.    Data Reviewed GI notes reviewed. Upper GI series reviewed.  Assessment    Symptomatic hiatal hernia with partial intrathoracic stomach.    Plan    Arrange for assessment by Arletta Bale, M.D.     PCP:  [place] This information has been scribed by Gaspar Cola CMA.   Robert Bellow 03/09/2016, 2:29 PM

## 2016-03-13 ENCOUNTER — Ambulatory Visit: Payer: Commercial Managed Care - HMO | Admitting: Cardiovascular Disease

## 2016-03-22 ENCOUNTER — Encounter: Payer: Self-pay | Admitting: *Deleted

## 2016-03-22 NOTE — Progress Notes (Signed)
Patient ID: Randy Combs, male   DOB: 1965/12/20, 50 y.o.   MRN: [id number]  Patient was scheduled for an appointment with Dr. Duke Salvia for 03-22-16 at 70 am.   Per Roanna Epley at Dr. Bernette Mayers office, patient did show up for his appointment today.

## 2016-05-03 ENCOUNTER — Ambulatory Visit
Admission: RE | Admit: 2016-05-03 | Discharge: 2016-05-03 | Disposition: A | Discharge status: Home / Self Care | Payer: Commercial Managed Care - HMO | Source: Ambulatory Visit | Attending: Bariatrics | Admitting: Bariatrics

## 2016-05-03 ENCOUNTER — Other Ambulatory Visit: Payer: Self-pay | Admitting: Bariatrics

## 2016-05-03 DIAGNOSIS — G8918 Other acute postprocedural pain: Secondary | ICD-10-CM | POA: Diagnosis not present

## 2016-05-03 DIAGNOSIS — R079 Chest pain, unspecified: Secondary | ICD-10-CM | POA: Insufficient documentation

## 2016-05-03 DIAGNOSIS — R509 Fever, unspecified: Secondary | ICD-10-CM | POA: Insufficient documentation

## 2016-05-03 DIAGNOSIS — R05 Cough: Secondary | ICD-10-CM | POA: Insufficient documentation

## 2016-05-03 DIAGNOSIS — J189 Pneumonia, unspecified organism: Secondary | ICD-10-CM

## 2016-06-27 ENCOUNTER — Telehealth: Payer: Self-pay

## 2016-06-27 NOTE — Telephone Encounter (Signed)
Pt request tramadol. Do not see on current or hx med list.Tried to contact pt but VM has not been set up yet.

## 2016-08-19 ENCOUNTER — Other Ambulatory Visit: Payer: Self-pay | Admitting: Family Medicine

## 2016-09-08 ENCOUNTER — Ambulatory Visit (INDEPENDENT_AMBULATORY_CARE_PROVIDER_SITE_OTHER): Payer: Commercial Managed Care - HMO

## 2016-09-08 ENCOUNTER — Ambulatory Visit (INDEPENDENT_AMBULATORY_CARE_PROVIDER_SITE_OTHER): Payer: Commercial Managed Care - HMO | Admitting: Podiatry

## 2016-09-08 ENCOUNTER — Encounter: Payer: Self-pay | Admitting: Podiatry

## 2016-09-08 DIAGNOSIS — M79672 Pain in left foot: Secondary | ICD-10-CM

## 2016-09-08 DIAGNOSIS — M79671 Pain in right foot: Secondary | ICD-10-CM | POA: Diagnosis not present

## 2016-09-08 DIAGNOSIS — M1000 Idiopathic gout, unspecified site: Secondary | ICD-10-CM | POA: Diagnosis not present

## 2016-09-08 DIAGNOSIS — M779 Enthesopathy, unspecified: Secondary | ICD-10-CM

## 2016-09-08 LAB — URIC ACID: Uric Acid, Serum: 6.6 mg/dL (ref 4.0–8.0)

## 2016-09-08 LAB — C-REACTIVE PROTEIN: CRP: 149 mg/L — ABNORMAL HIGH (ref <8.0)

## 2016-09-08 LAB — RHEUMATOID FACTOR

## 2016-09-08 MED ORDER — TRIAMCINOLONE ACETONIDE 10 MG/ML IJ SUSP
10.0000 mg | Freq: Once | INTRAMUSCULAR | Status: AC
  Administered 2016-09-08: 10 mg

## 2016-09-08 MED ORDER — PREDNISONE 10 MG PO TABS: ORAL_TABLET | ORAL | 0 refills | Status: DC

## 2016-09-08 NOTE — Progress Notes (Signed)
   Subjective:    Patient ID: Randy Combs, male    DOB: [birth year], 50 y.o.   MRN: [id number]  HPI Chief Complaint  Patient presents with  . Foot Pain    bilateral foot pain / "gout...worst I ever had" x 4 wks      Review of Systems  Cardiovascular: Positive for leg swelling.  Musculoskeletal: Positive for gait problem, joint swelling and myalgias.  All other systems reviewed and are negative.      Objective:   Physical Exam        Assessment & Plan:

## 2016-09-08 NOTE — Progress Notes (Signed)
Subjective:     Patient ID: Randy Combs, male   DOB: Apr 23, 1966, 50 y.o.   MRN: [id number]  HPI patient presents stating he has had a long-term history of gout and has taken indomethacin and culture seen at this time but he is developed severe pain in his right forefoot over the last few days with inflammation and inability to bear weight on it   Review of Systems  All other systems reviewed and are negative.      Objective:   Physical Exam  Constitutional: He is oriented to person, place, and time.  Cardiovascular: Intact distal pulses.   Musculoskeletal: Normal range of motion.  Neurological: He is oriented to person, place, and time.  Skin: Skin is warm.  Nursing note and vitals reviewed.  neurovascular status intact muscle strength was adequate range of motion within normal limits with inflammation and fluid buildup around the second MPJ right with pain of an exquisite nature. It is localized to this area but patient had a history of problems in other areas. Patient has good digital perfusion and is well oriented 3     Assessment:     Inflammatory capsulitis second MPJ right with probable gout noted    Plan:     H&P x-rays reviewed condition discussed and today I did a proximal nerve block of the area aspirated the second MPJ getting out of a small amount of serosanguineous fluid and injected with a quarter cc dexamethasone Kenalog and placed on a Sterapred 12 day DS Dosepak and sent for blood work to understand better his uric acid level. Reappoint to recheck in about 2 weeks  X-ray report indicates that there is flattening of the second MPJ left but the right looks pretty good with moderate changes around the first MPJ bilateral

## 2016-09-09 LAB — SEDIMENTATION RATE: Sed Rate: 36 mm/hr — ABNORMAL HIGH (ref 0–15)

## 2016-09-11 LAB — ANA, IFA COMPREHENSIVE PANEL
ANA: NEGATIVE
DS DNA AB: 1 [IU]/mL
ENA SM Ab Ser-aCnc: <1
SCLERODERMA (SCL-70) (ENA) ANTIBODY, IGG: NEGATIVE
SM/RNP: <1
SSA (Ro) (ENA) Antibody, IgG: <1
SSB (LA) (ENA) ANTIBODY, IGG: NEGATIVE

## 2016-09-12 ENCOUNTER — Ambulatory Visit: Payer: Commercial Managed Care - HMO | Admitting: Podiatry

## 2016-09-15 ENCOUNTER — Telehealth: Payer: Self-pay | Admitting: *Deleted

## 2016-09-15 NOTE — Telephone Encounter (Signed)
Dr. Paulla Dolly reviewed pt's 09/08/2016 blood work, states "Should be fine." Informed pt and helped him schedule his follow up for the date he was given at last visit, 09/25/2016 at 4:00PM.

## 2016-09-21 ENCOUNTER — Ambulatory Visit: Payer: Commercial Managed Care - HMO | Admitting: Podiatry

## 2016-09-25 ENCOUNTER — Telehealth: Payer: Self-pay | Admitting: *Deleted

## 2016-09-25 ENCOUNTER — Encounter: Payer: Self-pay | Admitting: Podiatry

## 2016-09-25 ENCOUNTER — Ambulatory Visit (INDEPENDENT_AMBULATORY_CARE_PROVIDER_SITE_OTHER): Payer: Commercial Managed Care - HMO | Admitting: Podiatry

## 2016-09-25 DIAGNOSIS — M1000 Idiopathic gout, unspecified site: Secondary | ICD-10-CM

## 2016-09-25 DIAGNOSIS — M779 Enthesopathy, unspecified: Secondary | ICD-10-CM

## 2016-09-25 MED ORDER — TRIAMCINOLONE ACETONIDE 10 MG/ML IJ SUSP
10.0000 mg | Freq: Once | INTRAMUSCULAR | Status: AC
  Administered 2016-09-25: 10 mg

## 2016-09-25 MED ORDER — INDOMETHACIN ER 75 MG PO CPCR: 75.0000 mg | ORAL_CAPSULE | Freq: Two times a day (BID) | ORAL | Status: DC

## 2016-09-25 MED ORDER — INDOMETHACIN 50 MG PO CAPS: 50.0000 mg | ORAL_CAPSULE | Freq: Two times a day (BID) | ORAL | 1 refills | Status: DC

## 2016-09-25 NOTE — Telephone Encounter (Signed)
Pt states the rx has not been sent to the pharmacy. I reviewed and the indomethacin was ordered as an injectable through the hospital.  I informed Dr. Paulla Dolly he ordered indomethacin 50mg  #60 one bid pc, +1refill. Informed pt the [facility]

## 2016-09-25 NOTE — Progress Notes (Signed)
Subjective:     Patient ID: Randy Combs, male   DOB: February 24, 1966, 50 y.o.   MRN: [id number]  HPI patient presents stating that he has had different spots now that have popped up as far as inflammation with his knee his big toe joint and his thumb of his right side being affected   Review of Systems     Objective:   Physical Exam Neurovascular status intact with inflammatory changes around the first MPJ right with fluid buildup and pain with patient noted to have also pain in the right knee medial side and the right thumb    Assessment:     Inflammatory capsulitis with elevated C-reactive protein and sedimentation rate right with strong possibility this is still gout or other inflammatory disease    Plan:     I'm referring him back to his family physician for treatment and today I carefully injected around the first MPJ 3 mg Kenalog 5 mill grams Xylocaine to give him relief and I did place him back on indomethacin as he has not had problem with hiatal hernia for the last year. He does understand the risk of this becoming aggravated again and we'll go to his family physician referral to rheumatologist and if he cannot get and he will come through me

## 2016-11-15 ENCOUNTER — Telehealth: Payer: Self-pay

## 2016-11-15 NOTE — Telephone Encounter (Signed)
I spoke with pt and he has been seeing another doctor about his gout issues but wants to schedule late day appt with Dr Lorelei Pont. Offered sooner appt but pt only wants to see Dr Lorelei Pont. Pt scheduled appt on 11/23/16.

## 2016-11-15 NOTE — Telephone Encounter (Signed)
Pt called back with a question per Morey Hummingbird and when picked up phone pt was gone. Left v/m requesting cb.

## 2016-11-23 ENCOUNTER — Ambulatory Visit: Payer: Commercial Managed Care - HMO | Admitting: Family Medicine

## 2016-11-28 ENCOUNTER — Ambulatory Visit (INDEPENDENT_AMBULATORY_CARE_PROVIDER_SITE_OTHER): Payer: Commercial Managed Care - HMO | Admitting: Family Medicine

## 2016-11-28 ENCOUNTER — Encounter: Payer: Self-pay | Admitting: Family Medicine

## 2016-11-28 VITALS — BP 146/92 | HR 82 | Temp 98.5°F | Ht 70.0 in | Wt 260.8 lb

## 2016-11-28 DIAGNOSIS — K922 Gastrointestinal hemorrhage, unspecified: Secondary | ICD-10-CM | POA: Diagnosis not present

## 2016-11-28 DIAGNOSIS — M1009 Idiopathic gout, multiple sites: Secondary | ICD-10-CM | POA: Diagnosis not present

## 2016-11-28 DIAGNOSIS — D500 Iron deficiency anemia secondary to blood loss (chronic): Secondary | ICD-10-CM

## 2016-11-28 DIAGNOSIS — K449 Diaphragmatic hernia without obstruction or gangrene: Secondary | ICD-10-CM | POA: Diagnosis not present

## 2016-11-28 MED ORDER — COLCHICINE 0.6 MG PO TABS: 0.6000 mg | ORAL_TABLET | Freq: Two times a day (BID) | ORAL | 5 refills | Status: DC

## 2016-11-28 MED ORDER — FEBUXOSTAT 40 MG PO TABS: 40.0000 mg | ORAL_TABLET | Freq: Every day | ORAL | 5 refills | Status: DC

## 2016-11-28 MED ORDER — CELECOXIB 400 MG PO CAPS: 400.0000 mg | ORAL_CAPSULE | Freq: Every day | ORAL | 5 refills | Status: DC

## 2016-11-28 NOTE — Progress Notes (Signed)
Pre visit review using our clinic review tool, if applicable. No additional management support is needed unless otherwise documented below in the visit note. 

## 2016-11-28 NOTE — Progress Notes (Signed)
Dr. Frederico Hamman T. Juvenal Umar, MD, [facility] and Sports Medicine [place], [address] Phone: [phone] Fax: [phone]  11/28/2016  Patient: Randy Combs, MRN: [id number], DOB: June 17, 1966, 50 y.o.  Primary Physician:  Owens Loffler, MD   Chief Complaint  Patient presents with  . Gout   Subjective:   Randy Combs is a 50 y.o. very pleasant male patient who presents with the following:  Gout, poorly controlled. Multiyear difficulty with gout, poorly controlled.  Previously I tried to start him on allopurinol twice, and the last time he did not take any of this medication.  1 time a did try him on allopurinol, and he had an exacerbation.  He is also taking colchicine in the past, but he is reliant on indomethacin historically.  This is significant in that the sense that the patient had a profound GI bleed and has used indomethacin indiscriminately in the past.  18 months ago I admitted him to the hospital with a hemoglobin of 6.  He required transfusion while in the hospital.  At that point, both GI and I had advised him to not take indomethacin in the future.  Recently he saw podiatry, and they placed him on indomethacin.  He is required joint injection with corticosteroid including by me and do some large joints as well as MTP joint injections by podiatry very recently.  Today he has some pain and loss of motion in his left elbow.  Recently, the patient had a markedly elevated CRP as well as a elevated's a sedimentation rate.  In the past his uric acid has been elevated.  Results for orders placed or performed in visit on 09/08/16  ANA, IFA Comprehensive Panel  Result Value Ref Range   Anit Nuclear Antibody(ANA) NEG NEGATIVE   ds DNA Ab 1 IU/mL   Scleroderma (Scl-70) (ENA) Antibody, IgG <1.0 NEG <1.0 NEG AI   SSA (Ro) (ENA) Antibody, IgG <1.0 NEG <1.0 NEG AI   SSB (La) (ENA) Antibody, IgG <1.0 NEG <1.0 NEG AI   ENA SM Ab  Ser-aCnc <1.0 NEG <1.0 NEG AI   SM/RNP <1.0 NEG <1.0 NEG AI  C-reactive protein  Result Value Ref Range   CRP 149.0 (H) <8.0 mg/L  Rheumatoid factor  Result Value Ref Range   Rhuematoid fact SerPl-aCnc <14 <14 IU/mL  Sedimentation rate  Result Value Ref Range   Sed Rate 36 (H) 0 - 15 mm/hr  Uric acid  Result Value Ref Range   Uric Acid, Serum 6.6 4.0 - 8.0 mg/dL     Past Medical History, Surgical History, Social History, Family History, Problem List, Medications, and Allergies have been reviewed and updated if relevant.  Patient Active Problem List   Diagnosis Date Noted  . Hiatal hernia 03/09/2016  . Pain in the chest 01/14/2016  . Anemia, iron deficiency 03/01/2015  . Acute upper GI bleed 03/01/2015  . Gout of multiple sites 03/01/2015  . Essential hypertension 03/04/2014    Past Medical History:  Diagnosis Date  . Alcohol abuse, in remission    quit in 2008  . Bleeding ulcer   . Gout of multiple sites 03/01/2015  . Hiatal hernia   . Tobacco abuse, in remission    Quit 2010--Dipped tin/day x 25 years  . Torn meniscus     Past Surgical History:  Procedure Laterality Date  . APPENDECTOMY  1970's    Social History   Social History  . Marital status: Married  Spouse name: N/A  . Number of children: N/A  . Years of education: N/A   Occupational History  . Not on file.   Social History Main Topics  . Smoking status: Current Some Day Smoker    Years: 20.00    Types: Cigars    Last attempt to quit: 02/07/2013  . Smokeless tobacco: Current User  . Alcohol use No     Comment: Quit in 2008  . Drug use: No  . Sexual activity: Not Currently   Other Topics Concern  . Not on file   Social History Narrative   Grading work (runs equipment)      Married      Regular exercise      Quit alcohol;tobacco      No drugs    Family History  Problem Relation Age of Onset  . Lung cancer Mother   . Healthy Father   . Healthy Child   . Healthy Child   .  Colon cancer Neg Hx   . Esophageal cancer Neg Hx     Allergies  Allergen Reactions  . Aspirin     Ulcer  . Ibuprofen     GI/ulcer  . Indomethacin     GI/ulcer    Medication list reviewed and updated in full in [place].   GEN: No acute illnesses, no fevers, chills. GI: No n/v/d, eating normally Pulm: No SOB Interactive and getting along well at home.  Otherwise, ROS is as per the HPI.  Objective:   BP (!) 146/92   Pulse 82   Temp 98.5 F (36.9 C) (Oral)   Ht 5\' 10"  (1.778 m)   Wt 260 lb 12.8 oz (118.3 kg)   SpO2 98%   BMI 37.42 kg/m   GEN: WDWN, NAD, Non-toxic, A & O x 3 HEENT: Atraumatic, Normocephalic. Neck supple. No masses, No LAD. Ears and Nose: No external deformity. EXTR: No c/c/e NEURO Normal gait.  PSYCH: Normally interactive. Conversant. Not depressed or anxious appearing.  Calm demeanor.   Mild tenderness at the left elbow with some restricted motion as well as bogginess at the true elbow joint.  Fingers and hands are not particularly swollen or painful or red today.  Laboratory and Imaging Data:  Assessment and Plan:   Idiopathic gout of multiple sites, unspecified chronicity  Acute upper GI bleed  Iron deficiency anemia due to chronic blood loss  Hiatal hernia  He has not really tried any prophylactic medication other than NSAIDs.  I have concern with indomethacin use in this patient given his GI bleed and admission with hemoglobin of 6 last year.  Recommend DC indomethacin, changed to Celebrex.  Start colchicine as well as Celebrex right now, and try to start the patient on Uloric.  Follow-up one month. If he continues to do poorly, rheumatological involvement would be a reasonable idea.  The patient told me today that he is doing so poorly from his gout that he is contemplating applying for permanent disability.  Patient Instructions  Start taking all 3 medicines at one time.   Keep doing that for 2 weeks, then you can drop  celebrex.   Keep taking the Uloric and colchicine for 1 week, then stop taking the colchicine.     Follow-up: No Follow-up on file.  Meds ordered this encounter  Medications  . colchicine 0.6 MG tablet    Sig: Take 1 tablet (0.6 mg total) by mouth 2 (two) times daily.    Dispense:  60  tablet    Refill:  5  . febuxostat (ULORIC) 40 MG tablet    Sig: Take 1 tablet (40 mg total) by mouth daily.    Dispense:  30 tablet    Refill:  5  . celecoxib (CELEBREX) 400 MG capsule    Sig: Take 1 capsule (400 mg total) by mouth daily after breakfast.    Dispense:  30 capsule    Refill:  5   Medications Discontinued During This Encounter  Medication Reason  . acetaminophen (TYLENOL) 325 MG tablet No longer needed (for PRN medications)  . celecoxib (CELEBREX) 200 MG capsule No longer needed (for PRN medications)  . colchicine 0.6 MG tablet No longer needed (for PRN medications)  . lisinopril (PRINIVIL,ZESTRIL) 20 MG tablet No longer needed (for PRN medications)  . omeprazole (PRILOSEC) 20 MG capsule No longer needed (for PRN medications)  . sucralfate (CARAFATE) 1 g tablet No longer needed (for PRN medications)   No orders of the defined types were placed in this encounter.   Signed,  Maud Deed. Raja Caputi, MD   Allergies as of 11/28/2016      Reactions   Aspirin    Ulcer   Ibuprofen    GI/ulcer   Indomethacin    GI/ulcer      Medication List       Accurate as of 11/28/16  5:44 PM. Always use your most recent med list.          celecoxib 400 MG capsule Commonly known as:  CELEBREX Take 1 capsule (400 mg total) by mouth daily after breakfast.   colchicine 0.6 MG tablet Take 1 tablet (0.6 mg total) by mouth 2 (two) times daily.   febuxostat 40 MG tablet Commonly known as:  ULORIC Take 1 tablet (40 mg total) by mouth daily.   indomethacin 50 MG capsule Commonly known as:  INDOCIN Take 1 capsule (50 mg total) by mouth 2 (two) times daily with a meal.

## 2016-11-28 NOTE — Patient Instructions (Signed)
Start taking all 3 medicines at one time.   Keep doing that for 2 weeks, then you can drop celebrex.   Keep taking the Uloric and colchicine for 1 week, then stop taking the colchicine.

## 2016-12-08 NOTE — Telephone Encounter (Signed)
Pt seen 11/28/16.

## 2017-01-01 ENCOUNTER — Encounter: Payer: Self-pay | Admitting: Family Medicine

## 2017-01-01 ENCOUNTER — Ambulatory Visit (INDEPENDENT_AMBULATORY_CARE_PROVIDER_SITE_OTHER): Payer: Commercial Managed Care - HMO | Admitting: Family Medicine

## 2017-01-01 VITALS — BP 150/100 | HR 83 | Temp 99.1°F | Ht 70.0 in | Wt 249.2 lb

## 2017-01-01 DIAGNOSIS — M1009 Idiopathic gout, multiple sites: Secondary | ICD-10-CM | POA: Diagnosis not present

## 2017-01-01 MED ORDER — PREDNISONE 20 MG PO TABS: ORAL_TABLET | ORAL | 0 refills | Status: DC

## 2017-01-01 NOTE — Progress Notes (Signed)
Pre visit review using our clinic review tool, if applicable. No additional management support is needed unless otherwise documented below in the visit note. 

## 2017-01-01 NOTE — Patient Instructions (Signed)

## 2017-01-01 NOTE — Progress Notes (Signed)
Dr. Frederico Hamman T. Kort Stettler, MD, [facility] Primary [facility] and Sports Medicine [place], [address] Phone: [phone] Fax: [phone]  01/01/2017  Patient: Randy Combs, MRN: [id number], DOB: 31-Dec-1965, 51 y.o.  Primary Physician:  Owens Loffler, MD   Chief Complaint  Patient presents with  . Follow-up    Gout   Subjective:   Randy Combs is a 51 y.o. very pleasant male patient who presents with the following:  F/u gout: the patient has been having extremely difficult time with gout.  I saw him one month ago.  At that point, we tried to start him on a regiment of Celebrex, colchicine and Uloric for maintenance.  Uloric - took for 2 weeks. He has continued to have some severe gout flares with pain in multiple joints in his hands, wrists, feet and ankles, MTP joints as well as others.  This is been an ongoing problem off and on for a few years, and we have tried multiple medications including indomethacin, but the patient last year got a GI bleed and had a hemoglobin down to 5 requiring hospitalization.  Since then, this is been extremely difficult to treat, and we have tried every known medication that I know up to try including allopurinol, Uloric, colchicine, Celebrex, and prednisone at various times.  He also has had joint injections at various times.  Gout in multiple joints last week.    Past Medical History, Surgical History, Social History, Family History, Problem List, Medications, and Allergies have been reviewed and updated if relevant.  Patient Active Problem List   Diagnosis Date Noted  . Hiatal hernia 03/09/2016  . Anemia, iron deficiency 03/01/2015  . Acute upper GI bleed 03/01/2015  . Gout of multiple sites 03/01/2015  . Essential hypertension 03/04/2014    Past Medical History:  Diagnosis Date  . Alcohol abuse, in remission    quit in 2008  . Bleeding ulcer   . Gout of multiple sites 03/01/2015  . Hiatal hernia   .  Tobacco abuse, in remission    Quit 2010--Dipped tin/day x 25 years  . Torn meniscus     Past Surgical History:  Procedure Laterality Date  . APPENDECTOMY  1970's    Social History   Social History  . Marital status: Married    Spouse name: N/A  . Number of children: N/A  . Years of education: N/A   Occupational History  . Not on file.   Social History Main Topics  . Smoking status: Current Some Day Smoker    Years: 20.00    Types: Cigars    Last attempt to quit: 02/07/2013  . Smokeless tobacco: Current User  . Alcohol use No     Comment: Quit in 2008  . Drug use: No  . Sexual activity: Not Currently   Other Topics Concern  . Not on file   Social History Narrative   Grading work (runs equipment)      Married      Regular exercise      Quit alcohol;tobacco      No drugs    Family History  Problem Relation Age of Onset  . Lung cancer Mother   . Healthy Father   . Healthy Child   . Healthy Child   . Colon cancer Neg Hx   . Esophageal cancer Neg Hx     Allergies  Allergen Reactions  . Aspirin     Ulcer  . Ibuprofen  GI/ulcer  . Indomethacin     GI/ulcer    Medication list reviewed and updated in full in [place].  GEN: No fevers, chills. Nontoxic. Primarily MSK c/o today. MSK: Detailed in the HPI GI: tolerating PO intake without difficulty Neuro: No numbness, parasthesias, or tingling associated. Otherwise the pertinent positives of the ROS are noted above.   Objective:   BP (!) 150/100   Pulse 83   Temp 99.1 F (37.3 C) (Oral)   Ht 5\' 10"  (1.778 m)   Wt 249 lb 4 oz (113.1 kg)   BMI 35.76 kg/m    GEN: WDWN, NAD, Non-toxic, Alert & Oriented x 3 HEENT: Atraumatic, Normocephalic.  Ears and Nose: No external deformity. EXTR: No clubbing/cyanosis/edema NEURO: Normal gait. antalgia PSYCH: Normally interactive. Conversant. Not depressed or anxious appearing.  Calm demeanor.    Multiple joints with extensive swelling,  tenderness, pain including bilateral hands, both feet and MTP regions.  There is some swelling and warmth associated.  Radiology: No results found.  Assessment and Plan:   Idiopathic gout of multiple sites, unspecified chronicity - Plan: Ambulatory referral to Rheumatology  The patient has one of the worst cases of gout and difficult to treat gout that I have ever seen in my career.  He has failed virtually every medication and treatment that I can think of.  We will give him some prednisone for 10 days to try to calm him down systemically, and hopefully rheumatology will have some other ideas to get him under better control.  Follow-up: No Follow-up on file.  Meds ordered this encounter  Medications  . predniSONE (DELTASONE) 20 MG tablet    Sig: 2 tabs po for 5 days, then 1 tab po for 5 days    Dispense:  15 tablet    Refill:  0   Medications Discontinued During This Encounter  Medication Reason  . indomethacin (INDOCIN) 50 MG capsule Discontinued by provider   Orders Placed This Encounter  Procedures  . Ambulatory referral to Rheumatology   Patient Instructions  Low-Purine Diet  Purines are compounds that affect the level of uric acid in your body. A low-purine diet is a diet that is low in purines. Eating a low-purine diet can prevent the level of uric acid in your body from getting too high and causing gout or kidney stones or both. What do I need to know about this diet?  Choose low-purine foods. Examples of low-purine foods are listed in the next section.  Drink plenty of fluids, especially water. Fluids can help remove uric acid from your body. Try to drink 8-16 cups (1.9-3.8 L) a day.  Limit foods high in fat, especially saturated fat, as fat makes it harder for the body to get rid of uric acid. Foods high in saturated fat include pizza, cheese, ice cream, whole milk, fried foods, and gravies. Choose foods that are lower in fat and lean sources of protein. Use olive oil  when cooking as it contains healthy fats that are not high in saturated fat.  Limit alcohol. Alcohol interferes with the elimination of uric acid from your body. If you are having a gout attack, avoid all alcohol.  Keep in mind that different people's bodies react differently to different foods. You will probably learn over time which foods do or do not affect you. If you discover that a food tends to cause your gout to flare up, avoid eating that food. You can more freely enjoy foods that do not  cause problems. If you have any questions about a food item, talk to your dietitian or health care provider. Which foods are low, moderate, and high in purines? The following is a list of foods that are low, moderate, and high in purines. You can eat any amount of the foods that are low in purines. You may be able to have small amounts of foods that are moderate in purines. Ask your health care provider how much of a food moderate in purines you can have. Avoid foods high in purines. Grains  Foods low in purines: Enriched white bread, pasta, rice, cake, cornbread, popcorn.  Foods moderate in purines: Whole-grain breads and cereals, wheat germ, bran, oatmeal. Uncooked oatmeal. Dry wheat bran or wheat germ.  Foods high in purines: Pancakes, [place] toast, biscuits, muffins. Vegetables  Foods low in purines: All vegetables, except those that are moderate in purines.  Foods moderate in purines: Asparagus, cauliflower, spinach, mushrooms, green peas. Fruits  All fruits are low in purines. Meats and other Protein Foods  Foods low in purines: Eggs, nuts, peanut butter.  Foods moderate in purines: 80-90% lean beef, lamb, veal, pork, poultry, fish, eggs, peanut butter, nuts. Crab, lobster, oysters, and shrimp. Cooked dried beans, peas, and lentils.  Foods high in purines: Anchovies, sardines, herring, mussels, tuna, codfish, scallops, trout, and haddock. Berniece Salines. Organ meats (such as liver or kidney). Tripe.  Game meat. Goose. Sweetbreads. Dairy  All dairy foods are low in purines. Low-fat and fat-free dairy products are best because they are low in saturated fat. Beverages  Drinks low in purines: Water, carbonated beverages, tea, coffee, cocoa.  Drinks moderate in purines: Soft drinks and other drinks sweetened with high-fructose corn syrup. Juices. To find whether a food or drink is sweetened with high-fructose corn syrup, look at the ingredients list.  Drinks high in purines: Alcoholic beverages (such as beer). Condiments  Foods low in purines: Salt, herbs, olives, pickles, relishes, vinegar.  Foods moderate in purines: Butter, margarine, oils, mayonnaise. Fats and Oils  Foods low in purines: All types, except gravies and sauces made with meat.  Foods high in purines: Gravies and sauces made with meat. Other Foods  Foods low in purines: Sugars, sweets, gelatin. Cake. Soups made without meat.  Foods moderate in purines: Meat-based or fish-based soups, broths, or bouillons. Foods and drinks sweetened with high-fructose corn syrup.  Foods high in purines: High-fat desserts (such as ice cream, cookies, cakes, pies, doughnuts, and chocolate). Contact your dietitian for more information on foods that are not listed here.  This information is not intended to replace advice given to you by your health care provider. Make sure you discuss any questions you have with your health care provider. Document Released: 03/17/2011 Document Revised: 04/27/2016 Document Reviewed: 10/27/2013 Elsevier Interactive Patient Education  2017 [place],  [place] T. Janae Bonser, MD   Allergies as of 01/01/2017      Reactions   Aspirin    Ulcer   Ibuprofen    GI/ulcer   Indomethacin    GI/ulcer      Medication List       Accurate as of 01/01/17 11:59 PM. Always use your most recent med list.          celecoxib 400 MG capsule Commonly known as:  CELEBREX Take 1 capsule (400 mg  total) by mouth daily after breakfast.   colchicine 0.6 MG tablet Take 1 tablet (0.6 mg total) by mouth 2 (two) times daily.  febuxostat 40 MG tablet Commonly known as:  ULORIC Take 1 tablet (40 mg total) by mouth daily.   predniSONE 20 MG tablet Commonly known as:  DELTASONE 2 tabs po for 5 days, then 1 tab po for 5 days

## 2017-02-20 DIAGNOSIS — M2550 Pain in unspecified joint: Secondary | ICD-10-CM | POA: Diagnosis not present

## 2017-02-20 DIAGNOSIS — M25571 Pain in right ankle and joints of right foot: Secondary | ICD-10-CM | POA: Diagnosis not present

## 2017-02-20 DIAGNOSIS — M19071 Primary osteoarthritis, right ankle and foot: Secondary | ICD-10-CM | POA: Diagnosis not present

## 2017-02-20 DIAGNOSIS — M7731 Calcaneal spur, right foot: Secondary | ICD-10-CM | POA: Diagnosis not present

## 2017-02-20 DIAGNOSIS — M79641 Pain in right hand: Secondary | ICD-10-CM | POA: Diagnosis not present

## 2017-02-20 DIAGNOSIS — G8929 Other chronic pain: Secondary | ICD-10-CM | POA: Diagnosis not present

## 2017-02-20 DIAGNOSIS — M1A09X0 Idiopathic chronic gout, multiple sites, without tophus (tophi): Secondary | ICD-10-CM | POA: Diagnosis not present

## 2017-02-20 DIAGNOSIS — M7989 Other specified soft tissue disorders: Secondary | ICD-10-CM | POA: Diagnosis not present

## 2017-03-11 ENCOUNTER — Encounter (HOSPITAL_COMMUNITY): Payer: Self-pay | Admitting: Emergency Medicine

## 2017-03-11 ENCOUNTER — Emergency Department (HOSPITAL_COMMUNITY)
Admission: EM | Admit: 2017-03-11 | Discharge: 2017-03-11 | Disposition: A | Discharge status: Home / Self Care | Payer: Commercial Managed Care - HMO | Attending: Emergency Medicine | Admitting: Emergency Medicine

## 2017-03-11 DIAGNOSIS — M2550 Pain in unspecified joint: Secondary | ICD-10-CM | POA: Insufficient documentation

## 2017-03-11 DIAGNOSIS — I10 Essential (primary) hypertension: Secondary | ICD-10-CM | POA: Insufficient documentation

## 2017-03-11 DIAGNOSIS — G8929 Other chronic pain: Secondary | ICD-10-CM

## 2017-03-11 DIAGNOSIS — F17290 Nicotine dependence, other tobacco product, uncomplicated: Secondary | ICD-10-CM | POA: Insufficient documentation

## 2017-03-11 DIAGNOSIS — M25571 Pain in right ankle and joints of right foot: Secondary | ICD-10-CM | POA: Diagnosis not present

## 2017-03-11 DIAGNOSIS — Z79899 Other long term (current) drug therapy: Secondary | ICD-10-CM | POA: Insufficient documentation

## 2017-03-11 DIAGNOSIS — M25572 Pain in left ankle and joints of left foot: Secondary | ICD-10-CM | POA: Diagnosis not present

## 2017-03-11 HISTORY — DX: Pain in unspecified joint: M25.50

## 2017-03-11 HISTORY — DX: Other chronic pain: G89.29

## 2017-03-11 MED ORDER — OXYCODONE-ACETAMINOPHEN 5-325 MG PO TABS: ORAL_TABLET | ORAL | 0 refills | Status: DC

## 2017-03-11 MED ORDER — PREDNISONE 50 MG PO TABS
60.0000 mg | ORAL_TABLET | Freq: Once | ORAL | Status: AC
  Administered 2017-03-11: 60 mg via ORAL
  Filled 2017-03-11: qty 1

## 2017-03-11 MED ORDER — PREDNISONE 10 MG PO TABS: ORAL_TABLET | ORAL | 0 refills | Status: DC

## 2017-03-11 MED ORDER — OXYCODONE-ACETAMINOPHEN 5-325 MG PO TABS
1.0000 | ORAL_TABLET | Freq: Once | ORAL | Status: AC
  Administered 2017-03-11: 1 via ORAL
  Filled 2017-03-11: qty 1

## 2017-03-11 NOTE — ED Triage Notes (Signed)
Pt c/o bilateral joint pain to his ankles, knees and elbows; pt has swelling to right elbow; pt finished prednisone the day the pain started

## 2017-03-11 NOTE — Discharge Instructions (Signed)
Take the prescriptions as directed.  Apply moist heat or ice to the area(s) of discomfort, for 15 minutes at a time, several times per day for the next few days.  Do not fall asleep on a heating or ice pack.  Call your regular medical doctor tomorrow to schedule a follow up appointment within the next 2 days. Return to the Emergency Department immediately if worsening.

## 2017-03-11 NOTE — ED Provider Notes (Signed)
[facility] DEPT Provider Note   CSN: [id number] Arrival date & time: 03/11/17  0507     History   Chief Complaint Chief Complaint  Patient presents with  . Joint Pain    HPI Randy Combs is a 51 y.o. male.  HPI  Pt was seen at 0520. Per pt, c/o gradual onset and persistence of constant acute flair of his chronic multiple joints "pain" for the past 1+ week. Joints pain include: ankles, knees, elbows, hands. Joints pain began several days after he stopped taking prednisone (rx by Rheumatologist for gout last month). Describes the joints pain as typical acute flair of his chronic longstanding pain, "since I was 22."  Denies fevers, no rash, no abd pain, no N/V/D, no CP/SOB, no injury, no open wounds.   Past Medical History:  Diagnosis Date  . Alcohol abuse, in remission    quit in 2008  . Bleeding ulcer   . Chronic pain of multiple joints   . Gout of multiple sites 03/01/2015  . Hiatal hernia   . Tobacco abuse, in remission    Quit 2010--Dipped tin/day x 25 years  . Torn meniscus     Patient Active Problem List   Diagnosis Date Noted  . Hiatal hernia 03/09/2016  . Anemia, iron deficiency 03/01/2015  . Acute upper GI bleed 03/01/2015  . Gout of multiple sites 03/01/2015  . Essential hypertension 03/04/2014    Past Surgical History:  Procedure Laterality Date  . APPENDECTOMY  1970's       Home Medications    Prior to Admission medications   Medication Sig Start Date End Date Taking? Authorizing Provider  celecoxib (CELEBREX) 400 MG capsule Take 1 capsule (400 mg total) by mouth daily after breakfast. Patient not taking: Reported on 01/01/2017 11/28/16   Owens Loffler, MD  colchicine 0.6 MG tablet Take 1 tablet (0.6 mg total) by mouth 2 (two) times daily. Patient not taking: Reported on 01/01/2017 11/28/16   Owens Loffler, MD  febuxostat (ULORIC) 40 MG tablet Take 1 tablet (40 mg total) by mouth daily. Patient not taking: Reported on 01/01/2017 11/28/16    Owens Loffler, MD  predniSONE (DELTASONE) 20 MG tablet 2 tabs po for 5 days, then 1 tab po for 5 days 01/01/17   Owens Loffler, MD    Family History Family History  Problem Relation Age of Onset  . Lung cancer Mother   . Healthy Father   . Healthy Child   . Healthy Child   . Colon cancer Neg Hx   . Esophageal cancer Neg Hx     Social History Social History  Substance Use Topics  . Smoking status: Current Some Day Smoker    Years: 20.00    Types: Cigars    Last attempt to quit: 02/07/2013  . Smokeless tobacco: Current User  . Alcohol use No     Comment: Quit in 2008     Allergies   Aspirin; Ibuprofen; and Indomethacin   Review of Systems Review of Systems ROS: Statement: All systems negative except as marked or noted in the HPI; Constitutional: Negative for fever and chills. ; ; Eyes: Negative for eye pain, redness and discharge. ; ; ENMT: Negative for ear pain, hoarseness, nasal congestion, sinus pressure and sore throat. ; ; Cardiovascular: Negative for chest pain, palpitations, diaphoresis, dyspnea and peripheral edema. ; ; Respiratory: Negative for cough, wheezing and stridor. ; ; Gastrointestinal: Negative for nausea, vomiting, diarrhea, abdominal pain, blood in stool, hematemesis, jaundice and rectal bleeding. . ; ;  Genitourinary: Negative for dysuria, flank pain and hematuria. ; ; Musculoskeletal: +joints pain. Negative for back pain and neck pain. Negative for deformity and trauma.; ; Skin: Negative for pruritus, rash, abrasions, blisters, bruising and skin lesion.; ; Neuro: Negative for headache, lightheadedness and neck stiffness. Negative for weakness, altered level of consciousness, altered mental status, extremity weakness, paresthesias, involuntary movement, seizure and syncope.      Physical Exam Updated Vital Signs BP (!) 153/97 (BP Location: Left Arm)   Pulse 76   Temp 97.9 F (36.6 C) (Oral)   Resp 20   Ht 5\' 10"  (1.778 m)   Wt 250 lb (113.4 kg)    SpO2 100%   BMI 35.87 kg/m   Physical Exam 0525: Physical examination:  Nursing notes reviewed; Vital signs and O2 SAT reviewed;  Constitutional: Well developed, Well nourished, Well hydrated, In no acute distress; Head:  Normocephalic, atraumatic; Eyes: EOMI, PERRL, No scleral icterus; ENMT: Mouth and pharynx normal, Mucous membranes moist; Neck: Supple, Full range of motion, No lymphadenopathy; Cardiovascular: Regular rate and rhythm, No gallop; Respiratory: Breath sounds clear & equal bilaterally, No wheezes.  Speaking full sentences with ease, Normal respiratory effort/excursion; Chest: Nontender, Movement normal; Abdomen: Soft, Nontender, Nondistended, Normal bowel sounds; Genitourinary: No CVA tenderness; Extremities: Pulses normal, +mild generalized TTP with localized edema to bilat ankles, right elbow, hands. No rash, no warmth, no deformity. No calf tenderness, edema or asymmetry.; Neuro: AA&Ox3, Major CN grossly intact.  Speech clear. No gross focal motor or sensory deficits in extremities.; Skin: Color normal, Warm, Dry.   ED Treatments / Results  Labs (all labs ordered are listed, but only abnormal results are displayed)   EKG  EKG Interpretation None       Radiology   Procedures Procedures (including critical care time)  Medications Ordered in ED Medications  oxyCODONE-acetaminophen (PERCOCET/ROXICET) 5-325 MG per tablet 1 tablet (not administered)  predniSONE (DELTASONE) tablet 60 mg (not administered)     Initial Impression / Assessment and Plan / ED Course  I have reviewed the triage vital signs and the nursing notes.  Pertinent labs & imaging results that were available during my care of the patient were reviewed by me and considered in my medical decision making (see chart for details).  MDM Reviewed: previous chart, nursing note and vitals Reviewed previous: labs and x-ray    0545:  Pt states he has been evaluated by his PMD and Rheum MD for these  longstanding joints pain; "but they don't do anything." EPIC chart reviewed: pt has had multiple joints xrays, injections, and lab work completed. There is a question of non-compliance with medications rx by PMD (office note 11/28/2016). Pt states he will call his PMD tomorrow but "he's not going back to the Rheumatologist because she was mean." Requesting names of local Rheum MD. Will tx with prednisone and pain meds at this time. Pt states he will make an appt with his PMD tomorrow. Dx d/w pt and family.  Questions answered.  Verb understanding, agreeable to d/c home with outpt f/u.   Final Clinical Impressions(s) / ED Diagnoses   Final diagnoses:  None    New Prescriptions New Prescriptions   No medications on file     Francine Graven, DO 03/14/17 1905

## 2017-03-12 ENCOUNTER — Encounter: Payer: Self-pay | Admitting: Family Medicine

## 2017-03-12 ENCOUNTER — Ambulatory Visit (INDEPENDENT_AMBULATORY_CARE_PROVIDER_SITE_OTHER): Payer: Commercial Managed Care - HMO | Admitting: Family Medicine

## 2017-03-12 VITALS — BP 142/90 | HR 70 | Temp 98.4°F | Ht 70.0 in | Wt 250.2 lb

## 2017-03-12 DIAGNOSIS — M1009 Idiopathic gout, multiple sites: Secondary | ICD-10-CM | POA: Diagnosis not present

## 2017-03-12 MED ORDER — COLCHICINE 0.6 MG PO TABS: 0.6000 mg | ORAL_TABLET | Freq: Two times a day (BID) | ORAL | 5 refills | Status: DC

## 2017-03-12 NOTE — Patient Instructions (Signed)

## 2017-03-12 NOTE — Progress Notes (Signed)
Dr. Frederico Hamman T. Kirtis Challis, MD, [facility] [place], [address] Phone: [phone] Fax: [phone]  03/12/2017  Patient: Randy Combs, MRN: [id number], DOB: 02-13-1966, 51 y.o.  Primary Physician:  Owens Loffler, MD   Chief Complaint  Patient presents with  . Discuss Rheumotolgy Referral   Subjective:   Randy Combs is a 51 y.o. very pleasant male patient who presents with the following:  Patient is here in follow-up, went to the ER yesterday. He also recently saw rheumatology, Dr. Olevia Bowens, and he is very frustrated at this point.  Previously, he would take indomethacin when he had gout flareups, but he got a severe GI bleed in his hemoglobin got down to about 5 or 6 2 years ago.  Since then his gout is been very poorly controlled. We have tried him on colchicine, Celebrex, intermittent dosing of prednisone, allopurinol, and Uloric. He has not done well on any of these.  He very recently saw Dr. Annalee Genta at [facility].  He left prior to getting all of his lab work done.  Past Medical History, Surgical History, Social History, Family History, Problem List, Medications, and Allergies have been reviewed and updated if relevant.  Patient Active Problem List   Diagnosis Date Noted  . Chronic pain of multiple joints 02/20/2017  . Hiatal hernia 03/09/2016  . Anemia, iron deficiency 03/01/2015  . Acute upper GI bleed 03/01/2015  . Gout of multiple sites 03/01/2015  . Essential hypertension 03/04/2014    Past Medical History:  Diagnosis Date  . Alcohol abuse, in remission    quit in 2008  . Bleeding ulcer   . Chronic pain of multiple joints   . Gout of multiple sites 03/01/2015  . Hiatal hernia   . Tobacco abuse, in remission    Quit 2010--Dipped tin/day x 25 years  . Torn meniscus     Past Surgical History:  Procedure Laterality Date  . APPENDECTOMY  1970's    Social History   Social  History  . Marital status: Married    Spouse name: N/A  . Number of children: N/A  . Years of education: N/A   Occupational History  . Not on file.   Social History Main Topics  . Smoking status: Current Some Day Smoker    Years: 20.00    Types: Cigars    Last attempt to quit: 02/07/2013  . Smokeless tobacco: Current User  . Alcohol use No     Comment: Quit in 2008  . Drug use: No  . Sexual activity: Not Currently   Other Topics Concern  . Not on file   Social History Narrative   Grading work (runs equipment)      Married      Regular exercise      Quit alcohol;tobacco      No drugs    Family History  Problem Relation Age of Onset  . Lung cancer Mother   . Healthy Father   . Healthy Child   . Healthy Child   . Colon cancer Neg Hx   . Esophageal cancer Neg Hx     Allergies  Allergen Reactions  . Aspirin     Ulcer  . Ibuprofen     GI/ulcer  . Indomethacin     GI/ulcer    Medication list reviewed and updated in full in [facility].   GEN: No acute illnesses, no fevers, chills. GI: No n/v/d, eating normally  Pulm: No SOB Interactive and getting along well at home.  Otherwise, ROS is as per the HPI.  Objective:   BP (!) 142/90   Pulse 70   Temp 98.4 F (36.9 C) (Oral)   Ht 5\' 10"  (1.778 m)   Wt 250 lb 4 oz (113.5 kg)   BMI 35.91 kg/m   GEN: WDWN, NAD, Non-toxic, A & O x 3 HEENT: Atraumatic, Normocephalic. Neck supple. No masses, No LAD. Ears and Nose: No external deformity. EXTR: No c/c/e NEURO Normal gait.  PSYCH: Normally interactive. Conversant. Not depressed or anxious appearing.  Calm demeanor.   R elbow with small olec bursitis Mild swelling in the hands and feet currently  Laboratory and Imaging Data: Results for orders placed or performed in visit on 09/08/16  ANA, IFA Comprehensive Panel  Result Value Ref Range   Anit Nuclear Antibody(ANA) NEG NEGATIVE   ds DNA Ab 1 IU/mL   Scleroderma (Scl-70) (ENA) Antibody, IgG <1.0  NEG <1.0 NEG AI   SSA (Ro) (ENA) Antibody, IgG <1.0 NEG <1.0 NEG AI   SSB (La) (ENA) Antibody, IgG <1.0 NEG <1.0 NEG AI   ENA SM Ab Ser-aCnc <1.0 NEG <1.0 NEG AI   SM/RNP <1.0 NEG <1.0 NEG AI  C-reactive protein  Result Value Ref Range   CRP 149.0 (H) <8.0 mg/L  Rheumatoid factor  Result Value Ref Range   Rhuematoid fact SerPl-aCnc <14 <14 IU/mL  Sedimentation rate  Result Value Ref Range   Sed Rate 36 (H) 0 - 15 mm/hr  Uric acid  Result Value Ref Range   Uric Acid, Serum 6.6 4.0 - 8.0 mg/dL     Assessment and Plan:   Acute idiopathic gout of multiple sites - Plan: Ambulatory referral to Rheumatology  Extremely challenging gout case. He has had a reasonably extensive rheumatological workup thus far. Scheduled colchicine BID now.  The patient requests evaluation of a research Hospital, and requested that I refer him to [facility] for their rheumatological expertise.  At this point he is fairly limited.  Follow-up: with Rheum  Meds ordered this encounter  Medications  . colchicine 0.6 MG tablet    Sig: Take 1 tablet (0.6 mg total) by mouth 2 (two) times daily.    Dispense:  60 tablet    Refill:  5   Medications Discontinued During This Encounter  Medication Reason  . colchicine 0.6 MG tablet Reorder   Orders Placed This Encounter  Procedures  . Ambulatory referral to Rheumatology    Signed,  Frederico Hamman T. Jalen Daluz, MD   Allergies as of 03/12/2017      Reactions   Aspirin    Ulcer   Ibuprofen    GI/ulcer   Indomethacin    GI/ulcer      Medication List       Accurate as of 03/12/17  1:22 PM. Always use your most recent med list.          celecoxib 400 MG capsule Commonly known as:  CELEBREX Take 1 capsule (400 mg total) by mouth daily after breakfast.   colchicine 0.6 MG tablet Take 1 tablet (0.6 mg total) by mouth 2 (two) times daily.   febuxostat 40 MG tablet Commonly known as:  ULORIC Take 1 tablet (40 mg total) by mouth daily.     oxyCODONE-acetaminophen 5-325 MG tablet Commonly known as:  PERCOCET/ROXICET 1 or 2 tabs PO q6h prn pain   predniSONE 10 MG tablet Commonly known as:  DELTASONE Take 5 tablets PO  x2 days, then take 4 tabs PO x3 days, then 3 tabs PO x3 days, then 2 tabs PO x3 days, then 1 tab PO x3 days

## 2017-03-12 NOTE — Progress Notes (Signed)
Pre visit review using our clinic review tool, if applicable. No additional management support is needed unless otherwise documented below in the visit note. 

## 2017-03-27 ENCOUNTER — Telehealth: Payer: Self-pay | Admitting: Family Medicine

## 2017-03-27 NOTE — Telephone Encounter (Signed)
I will review

## 2017-03-27 NOTE — Telephone Encounter (Signed)
Pt dropped off FMLA paperwork Spouse needed this filled out for her job Paperwork in Dr [employer] in box for review and signature

## 2017-03-29 DIAGNOSIS — Z7689 Persons encountering health services in other specified circumstances: Secondary | ICD-10-CM

## 2017-04-03 NOTE — Telephone Encounter (Signed)
Paperwork faxed 03/30/17  Copy for pt Copy for file Copy for scan Copy for billing Left message letting pt know paperwork had been fa

## 2017-04-08 ENCOUNTER — Emergency Department (HOSPITAL_COMMUNITY)
Admission: EM | Admit: 2017-04-08 | Discharge: 2017-04-08 | Disposition: A | Discharge status: Home / Self Care | Payer: Commercial Managed Care - HMO | Attending: Emergency Medicine | Admitting: Emergency Medicine

## 2017-04-08 ENCOUNTER — Encounter (HOSPITAL_COMMUNITY): Payer: Self-pay

## 2017-04-08 ENCOUNTER — Emergency Department (HOSPITAL_COMMUNITY): Payer: Commercial Managed Care - HMO

## 2017-04-08 DIAGNOSIS — M10061 Idiopathic gout, right knee: Secondary | ICD-10-CM | POA: Diagnosis not present

## 2017-04-08 DIAGNOSIS — M25461 Effusion, right knee: Secondary | ICD-10-CM | POA: Diagnosis not present

## 2017-04-08 DIAGNOSIS — I10 Essential (primary) hypertension: Secondary | ICD-10-CM | POA: Insufficient documentation

## 2017-04-08 DIAGNOSIS — Z79899 Other long term (current) drug therapy: Secondary | ICD-10-CM | POA: Diagnosis not present

## 2017-04-08 DIAGNOSIS — F17290 Nicotine dependence, other tobacco product, uncomplicated: Secondary | ICD-10-CM | POA: Diagnosis not present

## 2017-04-08 DIAGNOSIS — M25561 Pain in right knee: Secondary | ICD-10-CM | POA: Diagnosis present

## 2017-04-08 MED ORDER — FAMOTIDINE 20 MG PO TABS: 20.0000 mg | ORAL_TABLET | Freq: Two times a day (BID) | ORAL | 0 refills | Status: DC

## 2017-04-08 MED ORDER — INDOMETHACIN 25 MG PO CAPS
50.0000 mg | ORAL_CAPSULE | Freq: Once | ORAL | Status: AC
  Administered 2017-04-08: 50 mg via ORAL
  Filled 2017-04-08: qty 2

## 2017-04-08 MED ORDER — INDOMETHACIN 50 MG PO CAPS: 50.0000 mg | ORAL_CAPSULE | Freq: Three times a day (TID) | ORAL | 0 refills | Status: DC

## 2017-04-08 NOTE — ED Notes (Signed)
Pt. To xray on stretcher

## 2017-04-08 NOTE — ED Provider Notes (Signed)
[facility] DEPT Provider Note   CSN: [id number] Arrival date & time: 04/08/17  1119   By signing my name below, I, Hilbert Odor, attest that this documentation has been prepared under the direction and in the presence of Noemi Chapel, MD. Electronically Signed: Hilbert Odor, Scribe. 04/08/17. 12:03 PM. History   Chief Complaint Chief Complaint  Patient presents with  . Knee Pain    The history is provided by the patient. No language interpreter was used.  HPI Comments: Randy Combs is a 51 y.o. male who presents to the Emergency Department complaining of right knee pain and swelling since yesterday. He states that he has a hx of gout and believes his pain is related. He rates his pain 10/10. He states that his last flair-up was about 1 month ago. He is currently on prednisone. He denies any fevers.  He reports prior history of gastric surgery for a large hiatal hernia and was told not to take NSAIDs after surgery hence he has been avoiding indomethacin for this reason, has no GERD, no pain in the epigastrium after eating and no hx of ulcers - he has been on prednisone the better part of the last month.  No fevers, or redness of joint, and it is atraumatic.  Past Medical History:  Diagnosis Date  . Alcohol abuse, in remission    quit in 2008  . Bleeding ulcer   . Chronic pain of multiple joints   . Gout of multiple sites 03/01/2015  . Hiatal hernia   . Tobacco abuse, in remission    Quit 2010--Dipped tin/day x 25 years  . Torn meniscus     Patient Active Problem List   Diagnosis Date Noted  . Chronic pain of multiple joints 02/20/2017  . Hiatal hernia 03/09/2016  . Anemia, iron deficiency 03/01/2015  . Acute upper GI bleed 03/01/2015  . Gout of multiple sites 03/01/2015  . Essential hypertension 03/04/2014    Past Surgical History:  Procedure Laterality Date  . APPENDECTOMY  1970's  . HERNIA REPAIR     abdominal       Home Medications    Prior to  Admission medications   Medication Sig Start Date End Date Taking? Authorizing Provider  colchicine 0.6 MG tablet Take 1 tablet (0.6 mg total) by mouth 2 (two) times daily. 03/12/17   Copland, Frederico Hamman, MD  famotidine (PEPCID) 20 MG tablet Take 1 tablet (20 mg total) by mouth 2 (two) times daily. 04/08/17   Noemi Chapel, MD  febuxostat (ULORIC) 40 MG tablet Take 1 tablet (40 mg total) by mouth daily. Patient not taking: Reported on 01/01/2017 11/28/16   Owens Loffler, MD  indomethacin (INDOCIN) 50 MG capsule Take 1 capsule (50 mg total) by mouth 3 (three) times daily with meals. May take up to 50mg  three times a day if no improvement with 25mg . 04/08/17   Noemi Chapel, MD  oxyCODONE-acetaminophen (PERCOCET/ROXICET) 5-325 MG tablet 1 or 2 tabs PO q6h prn pain 03/11/17   Francine Graven, DO  predniSONE (DELTASONE) 10 MG tablet Take 5 tablets PO x2 days, then take 4 tabs PO x3 days, then 3 tabs PO x3 days, then 2 tabs PO x3 days, then 1 tab PO x3 days 03/11/17   Francine Graven, DO    Family History Family History  Problem Relation Age of Onset  . Lung cancer Mother   . Healthy Father   . Healthy Child   . Healthy Child   . Colon cancer Neg Hx   .  Esophageal cancer Neg Hx     Social History Social History  Substance Use Topics  . Smoking status: Current Some Day Smoker    Years: 20.00    Types: Cigars    Last attempt to quit: 02/07/2013  . Smokeless tobacco: Current User    Types: Snuff  . Alcohol use No     Comment: Quit in 2008     Allergies   Aspirin; Ibuprofen; and Indomethacin   Review of Systems Review of Systems  Constitutional: Negative for fever.  Musculoskeletal: Positive for arthralgias and joint swelling. Negative for back pain.       Right knee pain.  Neurological: Negative for weakness and numbness.     Physical Exam Updated Vital Signs BP 131/88 (BP Location: Right Arm)   Pulse 79   Temp 99 F (37.2 C)   Resp 20   Ht 5\' 10"  (1.778 m)   Wt 210 lb (95.3  kg)   SpO2 99%   BMI 30.13 kg/m   Physical Exam  Constitutional: He appears well-developed and well-nourished.  HENT:  Head: Normocephalic and atraumatic.  Eyes: Conjunctivae are normal. Right eye exhibits no discharge. Left eye exhibits no discharge.  Pulmonary/Chest: Effort normal. No respiratory distress.  Musculoskeletal: He exhibits tenderness.  All joints with normal ROM other than the R knee which has a clinical effusion and the L foot which is mildly swollen - neither are red or hot.  Compartments are diffusely soft.  Neurological: He is alert. Coordination normal.  Skin: Skin is warm and dry. No rash noted. He is not diaphoretic. No erythema.  Psychiatric: He has a normal mood and affect.  Nursing note and vitals reviewed.    ED Treatments / Results  DIAGNOSTIC STUDIES: Oxygen Saturation is 99% on RA, normal by my interpretation.    COORDINATION OF CARE: 11:49 AM Discussed treatment plan with pt at bedside and pt agreed to plan. I will give him some pain meds.   Labs (all labs ordered are listed, but only abnormal results are displayed) Labs Reviewed - No data to display   Radiology No results found.  Procedures Procedures (including critical care time)  Medications Ordered in ED Medications  indomethacin (INDOCIN) capsule 50 mg (50 mg Oral Given 04/08/17 1202)    Initial Impression / Assessment and Plan / ED Course  I have reviewed the triage vital signs and the nursing notes.  Pertinent labs & imaging results that were available during my care of the patient were reviewed by me and considered in my medical decision making (see chart for details).    Gout likely Does not appear to have a septic joint.  Final Clinical Impressions(s) / ED Diagnoses   Final diagnoses:  Acute idiopathic gout of right knee  Effusion of right knee    New Prescriptions Discharge Medication List as of 04/08/2017 12:02 PM    START taking these medications   Details    famotidine (PEPCID) 20 MG tablet Take 1 tablet (20 mg total) by mouth 2 (two) times daily., Starting Sun 04/08/2017, Print    indomethacin (INDOCIN) 50 MG capsule Take 1 capsule (50 mg total) by mouth 3 (three) times daily with meals. May take up to 50mg  three times a day if no improvement with 25mg ., Starting Sun 04/08/2017, Print       I personally performed the services described in this documentation, which was scribed in my presence. The recorded information has been reviewed and is accurate.  Noemi Chapel, MD 04/09/17 [phone]

## 2017-04-08 NOTE — Discharge Instructions (Signed)
Indomethacin 3 times daily for 7 days - stop taking when your pain goes away Stay off your feet today Take Pepcid or Zantac with the Indomethacin If you have blood in your stool or increased pain in you stomach / knee, return to the ER and stop the medicines immediately

## 2017-04-08 NOTE — ED Triage Notes (Signed)
Pt reports that right knee started hurting and swelling yesterday. Reports he has a hx of gout

## 2017-04-13 DIAGNOSIS — M064 Inflammatory polyarthropathy: Secondary | ICD-10-CM | POA: Diagnosis not present

## 2017-04-13 DIAGNOSIS — M2550 Pain in unspecified joint: Secondary | ICD-10-CM | POA: Diagnosis not present

## 2017-04-13 DIAGNOSIS — R5383 Other fatigue: Secondary | ICD-10-CM | POA: Diagnosis not present

## 2017-04-13 DIAGNOSIS — M150 Primary generalized (osteo)arthritis: Secondary | ICD-10-CM | POA: Diagnosis not present

## 2017-04-23 ENCOUNTER — Other Ambulatory Visit: Payer: Self-pay | Admitting: *Deleted

## 2017-04-23 MED ORDER — MITIGARE 0.6 MG PO CAPS: 0.6000 mg | ORAL_CAPSULE | Freq: Two times a day (BID) | ORAL | 0 refills | Status: DC

## 2017-04-23 NOTE — Telephone Encounter (Signed)
Received fax from [facility] stating patient was given samples of and savings card for Mitigare capsules.  Requesting new Rx for Mitigare with DAW.

## 2017-04-27 ENCOUNTER — Encounter: Payer: Self-pay | Admitting: Internal Medicine

## 2017-04-27 ENCOUNTER — Ambulatory Visit (INDEPENDENT_AMBULATORY_CARE_PROVIDER_SITE_OTHER): Payer: Commercial Managed Care - HMO | Admitting: Internal Medicine

## 2017-04-27 VITALS — BP 138/68 | HR 90 | Temp 98.5°F | Wt 248.8 lb

## 2017-04-27 DIAGNOSIS — L02212 Cutaneous abscess of back [any part, except buttock]: Secondary | ICD-10-CM

## 2017-04-27 MED ORDER — SULFAMETHOXAZOLE-TRIMETHOPRIM 800-160 MG PO TABS: 1.0000 | ORAL_TABLET | Freq: Two times a day (BID) | ORAL | 0 refills | Status: DC

## 2017-04-27 NOTE — Patient Instructions (Signed)

## 2017-04-27 NOTE — Progress Notes (Signed)
Subjective:    Patient ID: Randy Combs, male    DOB: 1966/03/21, 51 y.o.   MRN: [id number]  HPI  Pt presents to the clinic today with c/o a knot on his back. He noticed this 1 week ago. The area is tender and red. He reports it has not drained anything. He denies fever, chills or body aches. He has tried Database administrator and irish potato on it without any relief.  Review of Systems   Past Medical History:  Diagnosis Date  . Alcohol abuse, in remission    quit in 2008  . Bleeding ulcer   . Chronic pain of multiple joints   . Gout of multiple sites 03/01/2015  . Hiatal hernia   . Tobacco abuse, in remission    Quit 2010--Dipped tin/day x 25 years  . Torn meniscus     Current Outpatient Prescriptions  Medication Sig Dispense Refill  . colchicine 0.6 MG tablet Take 1 tablet (0.6 mg total) by mouth 2 (two) times daily. 60 tablet 5  . famotidine (PEPCID) 20 MG tablet Take 1 tablet (20 mg total) by mouth 2 (two) times daily. 30 tablet 0  . febuxostat (ULORIC) 40 MG tablet Take 1 tablet (40 mg total) by mouth daily. (Patient not taking: Reported on 01/01/2017) 30 tablet 5  . indomethacin (INDOCIN) 50 MG capsule Take 1 capsule (50 mg total) by mouth 3 (three) times daily with meals. May take up to 50mg  three times a day if no improvement with 25mg . 21 capsule 0  . MITIGARE 0.6 MG CAPS Take 0.6 mg by mouth 2 (two) times daily. 60 capsule 0  . oxyCODONE-acetaminophen (PERCOCET/ROXICET) 5-325 MG tablet 1 or 2 tabs PO q6h prn pain 12 tablet 0  . predniSONE (DELTASONE) 10 MG tablet Take 5 tablets PO x2 days, then take 4 tabs PO x3 days, then 3 tabs PO x3 days, then 2 tabs PO x3 days, then 1 tab PO x3 days 40 tablet 0   No current facility-administered medications for this visit.     Allergies  Allergen Reactions  . Aspirin     Ulcer  . Ibuprofen     GI/ulcer  . Indomethacin     GI/ulcer    Family History  Problem Relation Age of Onset  . Lung cancer Mother   . Healthy Father   .  Healthy Child   . Healthy Child   . Colon cancer Neg Hx   . Esophageal cancer Neg Hx     Social History   Social History  . Marital status: Married    Spouse name: N/A  . Number of children: N/A  . Years of education: N/A   Occupational History  . Not on file.   Social History Main Topics  . Smoking status: Current Some Day Smoker    Years: 20.00    Types: Cigars    Last attempt to quit: 02/07/2013  . Smokeless tobacco: Current User    Types: Snuff  . Alcohol use No     Comment: Quit in 2008  . Drug use: No  . Sexual activity: Not Currently   Other Topics Concern  . Not on file   Social History Narrative   Grading work (runs equipment)      Married      Regular exercise      Quit alcohol;tobacco      No drugs     Constitutional: Denies fever, malaise, fatigue, headache or abrupt weight changes.  Skin: Pt reports a knot on his back. Denies redness, rashes, or ulcercations.   No other specific complaints in a complete review of systems (except as listed in HPI above).     Objective:   Physical Exam   BP 138/68   Pulse 90   Temp 98.5 F (36.9 C) (Oral)   Wt 248 lb 12 oz (112.8 kg)   SpO2 97%   BMI 35.69 kg/m  Wt Readings from Last 3 Encounters:  04/27/17 248 lb 12 oz (112.8 kg)  04/08/17 210 lb (95.3 kg)  03/12/17 250 lb 4 oz (113.5 kg)    General: Appears his stated age, in NAD. Skin: 1 cm by 2 cm round abscess noted on back. No head, not fluctuant.  BMET    Component Value Date/Time   NA 141 02/26/2015 0409   K 4.0 02/26/2015 0409   CL 105 02/26/2015 0409   CO2 29 02/26/2015 0409   GLUCOSE 86 02/26/2015 0409   BUN 15 02/26/2015 0409   CREATININE 1.15 02/26/2015 0409   CALCIUM 9.3 02/26/2015 0409   GFRNONAA >60 02/26/2015 0409   GFRAA >60 02/26/2015 0409    Lipid Panel     Component Value Date/Time   CHOL 223 (HH) 01/19/2009 1127   TRIG 158 (H) 01/19/2009 1127   HDL 39.5 01/19/2009 1127   CHOLHDL 5.6 CALC 01/19/2009 1127    VLDL 32 01/19/2009 1127    CBC    Component Value Date/Time   WBC 13.8 (H) 10/18/2015 1247   RBC 4.94 10/18/2015 1247   HGB 15.5 10/18/2015 1247   HGB 7.5 (L) 02/26/2015 0409   HCT 46.2 10/18/2015 1247   HCT 25.4 (L) 02/26/2015 0409   PLT 262.0 10/18/2015 1247   PLT 444 (H) 02/26/2015 0409   MCV 93.5 10/18/2015 1247   MCV 68 (L) 02/26/2015 0409   MCH 20.3 (L) 02/26/2015 0409   MCHC 33.5 10/18/2015 1247   RDW 15.1 10/18/2015 1247   RDW 19.9 (H) 02/26/2015 0409   LYMPHSABS 1.7 10/18/2015 1247   LYMPHSABS 1.5 02/26/2015 0409   MONOABS 1.2 (H) 10/18/2015 1247   MONOABS 0.6 02/26/2015 0409   EOSABS 0.1 10/18/2015 1247   EOSABS 0.2 02/26/2015 0409   BASOSABS 0.0 10/18/2015 1247   BASOSABS 0.1 02/26/2015 0409    Hgb A1C No results found for: HGBA1C         Assessment & Plan:   Abscess of Back:  Hot compresses TID Septra BID x 14 days  Return precautions discussed Webb Silversmith, NP

## 2017-05-21 ENCOUNTER — Other Ambulatory Visit: Payer: Self-pay | Admitting: Family Medicine

## 2017-09-24 ENCOUNTER — Emergency Department (HOSPITAL_COMMUNITY): Payer: 59

## 2017-09-24 ENCOUNTER — Encounter (HOSPITAL_COMMUNITY): Payer: Self-pay

## 2017-09-24 ENCOUNTER — Emergency Department (HOSPITAL_COMMUNITY)
Admission: EM | Admit: 2017-09-24 | Discharge: 2017-09-24 | Disposition: A | Discharge status: Home / Self Care | Payer: 59 | Attending: Emergency Medicine | Admitting: Emergency Medicine

## 2017-09-24 ENCOUNTER — Emergency Department (HOSPITAL_BASED_OUTPATIENT_CLINIC_OR_DEPARTMENT_OTHER)
Admit: 2017-09-24 | Discharge: 2017-09-24 | Disposition: A | Discharge status: Home / Self Care | Payer: 59 | Attending: Emergency Medicine | Admitting: Emergency Medicine

## 2017-09-24 DIAGNOSIS — F17290 Nicotine dependence, other tobacco product, uncomplicated: Secondary | ICD-10-CM | POA: Diagnosis not present

## 2017-09-24 DIAGNOSIS — M79609 Pain in unspecified limb: Secondary | ICD-10-CM

## 2017-09-24 DIAGNOSIS — L03116 Cellulitis of left lower limb: Secondary | ICD-10-CM | POA: Diagnosis not present

## 2017-09-24 DIAGNOSIS — I10 Essential (primary) hypertension: Secondary | ICD-10-CM | POA: Diagnosis not present

## 2017-09-24 DIAGNOSIS — M7989 Other specified soft tissue disorders: Secondary | ICD-10-CM | POA: Diagnosis not present

## 2017-09-24 DIAGNOSIS — R2242 Localized swelling, mass and lump, left lower limb: Secondary | ICD-10-CM | POA: Diagnosis present

## 2017-09-24 LAB — CBC WITH DIFFERENTIAL/PLATELET
Basophils Absolute: 0 10*3/uL (ref 0.0–0.1)
Basophils Relative: 0 %
Eosinophils Absolute: 0.1 10*3/uL (ref 0.0–0.7)
Eosinophils Relative: 2 %
HCT: 42.4 % (ref 39.0–52.0)
Hemoglobin: 14.8 g/dL (ref 13.0–17.0)
Lymphocytes Relative: 18 %
Lymphs Abs: 1.6 10*3/uL (ref 0.7–4.0)
MCH: 34.1 pg — ABNORMAL HIGH (ref 26.0–34.0)
MCHC: 34.9 g/dL (ref 30.0–36.0)
MCV: 97.7 fL (ref 78.0–100.0)
Monocytes Absolute: 0.9 10*3/uL (ref 0.1–1.0)
Monocytes Relative: 10 %
Neutro Abs: 5.9 10*3/uL (ref 1.7–7.7)
Neutrophils Relative %: 70 %
Platelets: 339 10*3/uL (ref 150–400)
RBC: 4.34 MIL/uL (ref 4.22–5.81)
RDW: 12.9 % (ref 11.5–15.5)
WBC: 8.5 10*3/uL (ref 4.0–10.5)

## 2017-09-24 LAB — BASIC METABOLIC PANEL
Anion gap: 6 (ref 5–15)
BUN: 15 mg/dL (ref 6–20)
CO2: 27 mmol/L (ref 22–32)
Calcium: 9.3 mg/dL (ref 8.9–10.3)
Chloride: 100 mmol/L — ABNORMAL LOW (ref 101–111)
Creatinine, Ser: 1.09 mg/dL (ref 0.61–1.24)
GFR calc Af Amer: >60 mL/min (ref >60)
GFR calc non Af Amer: >60 mL/min (ref >60)
Glucose, Bld: 103 mg/dL — ABNORMAL HIGH (ref 65–99)
Potassium: 4.1 mmol/L (ref 3.5–5.1)
Sodium: 133 mmol/L — ABNORMAL LOW (ref 135–145)

## 2017-09-24 MED ORDER — NAPROXEN 375 MG PO TABS: 375.0000 mg | ORAL_TABLET | Freq: Two times a day (BID) | ORAL | 0 refills | Status: DC

## 2017-09-24 MED ORDER — CEPHALEXIN 500 MG PO CAPS: 500.0000 mg | ORAL_CAPSULE | Freq: Four times a day (QID) | ORAL | 0 refills | Status: DC

## 2017-09-24 NOTE — ED Notes (Signed)
Pt has swelling on left foot, left ankle and feels warm to the touch from foot to knee area.

## 2017-09-24 NOTE — ED Notes (Signed)
Pt out to vascular for study

## 2017-09-24 NOTE — ED Notes (Signed)
Pt returned from vascular

## 2017-09-24 NOTE — ED Provider Notes (Signed)
[place] EM[facility]ENCY DEPARTMENT Provider Note   CSN: [id number] Arrival date & time: 09/24/17  7353     History   Chief Complaint Chief Complaint  Patient presents with  . Ankle Pain    HPI Randy Combs is a 51 y.o. male.  The history is provided by the patient and medical records. No language interpreter was used.  Ankle Pain    Randy Combs is a 51 y.o. male  with a PMH of gout, HTN who presents to the Emergency Department complaining of constant, worsening left ankle and foot pain x 1 week. Associated symptoms include swelling and redness to the area. Patient states that the left lateral ankle hurts the most, but forefoot is sore as well. He has tried Tylenol and ibuprofen with no improvement. He has also been elevating the left leg and applying Biofreeze with little improvement. Pain is worse with ambulation and palpation. He bought crutches which he has been using due to severity of pain with ambulation. Patient denies injury / trauma / inciting event. He does not remember a bite or wound to the area. No hx of DM or immune compromised states. No fever, numbness or weakness.   Past Medical History:  Diagnosis Date  . Alcohol abuse, in remission    quit in 2008  . Bleeding ulcer   . Chronic pain of multiple joints   . Gout of multiple sites 03/01/2015  . Hiatal hernia   . Tobacco abuse, in remission    Quit 2010--Dipped tin/day x 25 years  . Torn meniscus     Patient Active Problem List   Diagnosis Date Noted  . Chronic pain of multiple joints 02/20/2017  . Hiatal hernia 03/09/2016  . Anemia, iron deficiency 03/01/2015  . Gout of multiple sites 03/01/2015  . Essential hypertension 03/04/2014    Past Surgical History:  Procedure Laterality Date  . APPENDECTOMY  1970's  . HERNIA REPAIR     abdominal       Home Medications    Prior to Admission medications   Medication Sig Start Date End Date Taking? Authorizing Provider    ibuprofen (ADVIL,MOTRIN) 200 MG tablet Take 200 mg by mouth every 6 (six) hours as needed for moderate pain.   Yes [provider]  cephALEXin (KEFLEX) 500 MG capsule Take 1 capsule (500 mg total) by mouth 4 (four) times daily. 09/24/17   Ward, Ozella Almond, PA-C  famotidine (PEPCID) 20 MG tablet Take 1 tablet (20 mg total) by mouth 2 (two) times daily. Patient not taking: Reported on 09/24/2017 04/08/17   Noemi Chapel, MD  febuxostat (ULORIC) 40 MG tablet Take 1 tablet (40 mg total) by mouth daily. Patient not taking: Reported on 09/24/2017 11/28/16   Copland, Frederico Hamman, MD  indomethacin (INDOCIN) 50 MG capsule Take 1 capsule (50 mg total) by mouth 3 (three) times daily with meals. May take up to 50mg  three times a day if no improvement with 25mg . Patient not taking: Reported on 09/24/2017 04/08/17   Noemi Chapel, MD  MITIGARE 0.6 MG CAPS TAKE ONE CAPSULE BY MOUTH TWICE A DAY Patient not taking: Reported on 09/24/2017 05/21/17   Jinny Sanders, MD  naproxen (NAPROSYN) 375 MG tablet Take 1 tablet (375 mg total) by mouth 2 (two) times daily. 09/24/17   Ward, Ozella Almond, PA-C  sulfamethoxazole-trimethoprim (BACTRIM DS,SEPTRA DS) 800-160 MG tablet Take 1 tablet by mouth 2 (two) times daily. Patient not taking: Reported on 09/24/2017 04/27/17   Webb Silversmith  W, NP    Family History Family History  Problem Relation Age of Onset  . Lung cancer Mother   . Healthy Father   . Healthy Child   . Healthy Child   . Colon cancer Neg Hx   . Esophageal cancer Neg Hx     Social History Social History  Substance Use Topics  . Smoking status: Current Some Day Smoker    Years: 20.00    Types: Cigars    Last attempt to quit: 02/07/2013  . Smokeless tobacco: Current User    Types: Snuff  . Alcohol use No     Comment: Quit in 2008     Allergies   Aspirin; Ibuprofen; and Indomethacin   Review of Systems Review of Systems  Constitutional: Negative for fever.  Respiratory: Negative for  shortness of breath.   Cardiovascular: Positive for leg swelling. Negative for chest pain and palpitations.  Musculoskeletal: Positive for arthralgias.  Skin: Positive for color change. Negative for wound.  All other systems reviewed and are negative.    Physical Exam Updated Vital Signs BP (!) 145/100 (BP Location: Right Arm)   Pulse 80   Temp 98.4 F (36.9 C) (Oral)   Resp 16   SpO2 99%   Physical Exam  Constitutional: He is oriented to person, place, and time. He appears well-developed and well-nourished. No distress.  HENT:  Head: Normocephalic and atraumatic.  Cardiovascular: Normal rate, regular rhythm, normal heart sounds and intact distal pulses.   No murmur heard. Pulmonary/Chest: Effort normal and breath sounds normal. No respiratory distress.  Abdominal: Soft. He exhibits no distension. There is no tenderness.  Musculoskeletal:  Erythema and warm to left lateral ankle and across the dorsum of the foot which is tender to palpation. + swelling to foot and ankle as well. Full ROM of ankle without difficulty. Able to wiggle all toes. Sensation intact.  Neurological: He is alert and oriented to person, place, and time.  Skin: Skin is warm and dry.  Nursing note and vitals reviewed.    ED Treatments / Results  Labs (all labs ordered are listed, but only abnormal results are displayed) Labs Reviewed  CBC WITH DIFFERENTIAL/PLATELET - Abnormal; Notable for the following:       Result Value   MCH 34.1 (*)    All other components within normal limits  BASIC METABOLIC PANEL - Abnormal; Notable for the following:    Sodium 133 (*)    Chloride 100 (*)    Glucose, Bld 103 (*)    All other components within normal limits    EKG  EKG Interpretation None       Radiology Dg Ankle Complete Left  Result Date: 09/24/2017 CLINICAL DATA:  Left foot and ankle pain.  No injury. EXAM: LEFT ANKLE COMPLETE - 3+ VIEW; LEFT FOOT - COMPLETE 3+ VIEW COMPARISON:  None. FINDINGS:  No acute fracture or malalignment. Small tibiotalar joint effusion. The talar dome is intact. The ankle mortise is symmetric. Severe joint space loss with bone-on-bone apposition of the first MTP joint. Flattening of the second metatarsal head with secondary joint space loss and osteophyte formation of the second MTP joint. Small plantar enthesophyte. Bone mineralization is normal. Diffuse soft tissue swelling of the foot and along the lateral malleolus. IMPRESSION: 1. Diffuse soft tissue swelling of the foot and along the lateral malleolus. No acute osseous abnormality. 2. Severe first MTP joint degenerative changes. 3. 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 Electronically Signed   By: Titus Dubin M.D.   On: 09/24/2017 10:30   Dg Foot Complete Left  Result Date: 09/24/2017 CLINICAL DATA:  Left foot and ankle pain.  No injury. EXAM: LEFT ANKLE COMPLETE - 3+ VIEW; LEFT FOOT - COMPLETE 3+ VIEW COMPARISON:  None. FINDINGS: No acute fracture or malalignment. Small tibiotalar joint effusion. The talar dome is intact. The ankle mortise is symmetric. Severe joint space loss with bone-on-bone apposition of the first MTP joint. Flattening of the second metatarsal head with secondary joint space loss and osteophyte formation of the second MTP joint. Small plantar enthesophyte. Bone mineralization is normal. Diffuse soft tissue swelling of the foot and along the lateral malleolus. IMPRESSION: 1. Diffuse soft tissue swelling of the foot and along the lateral malleolus. No acute osseous abnormality. 2. Severe first MTP joint degenerative changes. 3. 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 Electronically Signed   By: Titus Dubin M.D.   On: 09/24/2017 10:30    Procedures Procedures (including critical care time)  Medications  Ordered in ED Medications - No data to display   Initial Impression / Assessment and Plan / ED Course  I have reviewed the triage vital signs and the nursing notes.  Pertinent labs & imaging results that were available during my care of the patient were reviewed by me and considered in my medical decision making (see chart for details).    Randy Combs is a 51 y.o. male who presents to ED for left foot pain and swelling. On exam, patient is afebrile, hemodynamically stable with tenderness, erythema and warmth to the left foot and ankle. He has full range of motion without any difficulty. Doubt septic joint. Labs reassuring. Normal white count. X-ray obtained with diffuse soft tissue swelling but no acute bony abnormalities. LE ultrasound obtained to rule out DVT which was negative. Will treat cellulitis with keflex and short course NSAID's. Discussed symptomatic home care instructions, follow-up care and reasons to return to the emergency department. All questions answered.  Patient seen by and discussed with Dr. Regenia Skeeter who agrees with treatment plan.    Final Clinical Impressions(s) / ED Diagnoses   Final diagnoses:  Cellulitis of left lower extremity    New Prescriptions New Prescriptions   CEPHALEXIN (KEFLEX) 500 MG CAPSULE    Take 1 capsule (500 mg total) by mouth 4 (four) times daily.   NAPROXEN (NAPROSYN) 375 MG TABLET    Take 1 tablet (375 mg total) by mouth 2 (two) times daily.     Ward, Ozella Almond, PA-C 09/24/17 1158    Sherwood Gambler, MD 09/24/17 [phone]

## 2017-09-24 NOTE — ED Notes (Signed)
Patient transported to X-ray 

## 2017-09-24 NOTE — Discharge Instructions (Signed)
It was my pleasure taking care of you today!   Please take all of your antibiotics until finished!  Take Naproxen twice daily for 3 days, then only as needed for severe pain. Alternate with tylenol as needed for pain relief.   Follow up with your primary care provider.   Return to ER for fevers, new or worsening symptoms, any additional concerns.

## 2017-09-24 NOTE — ED Triage Notes (Addendum)
Pt has pain in the left ankle. Pt states he does not remember any injury but he does work in [employer], etc. Foot is markedly swollen. Some discoloration noted to the lateral side of left foot.

## 2017-09-24 NOTE — Progress Notes (Signed)
VASCULAR LAB PRELIMINARY  PRELIMINARY  PRELIMINARY  PRELIMINARY  Left lower extremity venous duplex completed.    Preliminary report:  There is no DVT or SVT noted in the left lower extremity.   Catheryne Deford, RVT 09/24/2017, 11:06 AM

## 2017-10-12 ENCOUNTER — Ambulatory Visit: Payer: 59 | Admitting: Family Medicine

## 2017-10-12 ENCOUNTER — Encounter: Payer: Self-pay | Admitting: Family Medicine

## 2017-10-12 ENCOUNTER — Ambulatory Visit (INDEPENDENT_AMBULATORY_CARE_PROVIDER_SITE_OTHER)
Admission: RE | Admit: 2017-10-12 | Discharge: 2017-10-12 | Disposition: A | Discharge status: Home / Self Care | Payer: 59 | Source: Ambulatory Visit | Attending: Family Medicine | Admitting: Family Medicine

## 2017-10-12 VITALS — BP 150/92 | HR 70 | Temp 97.9°F | Wt 265.0 lb

## 2017-10-12 DIAGNOSIS — Z23 Encounter for immunization: Secondary | ICD-10-CM

## 2017-10-12 DIAGNOSIS — M7989 Other specified soft tissue disorders: Secondary | ICD-10-CM | POA: Diagnosis not present

## 2017-10-12 DIAGNOSIS — M79673 Pain in unspecified foot: Secondary | ICD-10-CM | POA: Diagnosis not present

## 2017-10-12 MED ORDER — CEPHALEXIN 500 MG PO CAPS: 500.0000 mg | ORAL_CAPSULE | Freq: Four times a day (QID) | ORAL | 0 refills | Status: DC

## 2017-10-12 NOTE — Patient Instructions (Signed)
Go to the lab on the way out.  We'll contact you with your xray report.  Restart the antibiotics in the meantime and update [place] Monday.   Take it easy this weekend.  Take care.  Glad to see you.

## 2017-10-12 NOTE — Progress Notes (Signed)
Has been off uloric for about 8 months.  He has been having less flares in the meantime.  Has been able to tolerate prn ibuprofen.    He had L foot pain and swelling.  Progressive swelling and fever in the foot.  More pain in the foot.  He thought it was gout initially.  He went to ER.  dx'd with cellulitis.   U/s with NO DVT.   Imaging with:  1. Diffuse soft tissue swelling of the foot and along the lateral malleolus. No acute osseous abnormality. 2. Severe first MTP joint degenerative changes. 3. 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  All d/w pt. Images reviewed with patient at OV, esp re OA changes at the 1st MTP.   He got on abx and was getting some better in the meantime.  He went back to work and doesn't have to wear steel toe boots.  He is up and down on ladders. With sig pain after work, worsening recently.     The feeling of heat isn't back in the foot.  He still has sig 1st and 2nd MTP pain.    He also injured his L 2nd finger about 1 month ago.  He doesn't have pain with flexion and but limited ROM due to swelling.    Meds, vitals, and allergies reviewed.   ROS: Per HPI unless specifically indicated in ROS section   nad L 2nd finger with sensation intact and normal cap refill but diffuse swelling and dec flexion due to swelling.   L foot ttp near the 1st MTP.  DP pulse intact.  Some distal foot swelling with mild warmth.  No fluctuant mass.  Able to bear weight with discomfort.   Medial and lateral mal not ttp, midfoot not ttp.

## 2017-10-14 DIAGNOSIS — M7989 Other specified soft tissue disorders: Secondary | ICD-10-CM | POA: Insufficient documentation

## 2017-10-14 DIAGNOSIS — M79673 Pain in unspecified foot: Secondary | ICD-10-CM | POA: Insufficient documentation

## 2017-10-14 NOTE — Assessment & Plan Note (Addendum)
Check imaging today, see notes on imaging.  With injury 1 month ago, no acute intervention needed.

## 2017-10-14 NOTE — Assessment & Plan Note (Signed)
The concern is for the possible return of cellulitis.  Discussed with patient.  Still okay for outpatient follow-up.  Restart Keflex.  Routine cautions given.  He will update [place] on Monday.  See AVS.  Not likely to be gout given the duration.  He does likely have significant osteoarthritic flare at the first MTP.  Discussed with patient.  I will ask PCP for input in his case.

## 2017-10-17 ENCOUNTER — Encounter: Payer: Self-pay | Admitting: Family Medicine

## 2017-10-17 ENCOUNTER — Encounter: Payer: Self-pay | Admitting: *Deleted

## 2017-10-17 ENCOUNTER — Ambulatory Visit: Payer: 59 | Admitting: Family Medicine

## 2017-10-17 VITALS — BP 130/96 | HR 97 | Temp 98.4°F | Ht 70.0 in | Wt 262.8 lb

## 2017-10-17 DIAGNOSIS — R7982 Elevated C-reactive protein (CRP): Secondary | ICD-10-CM | POA: Diagnosis not present

## 2017-10-17 DIAGNOSIS — M1009 Idiopathic gout, multiple sites: Secondary | ICD-10-CM

## 2017-10-17 DIAGNOSIS — M7989 Other specified soft tissue disorders: Secondary | ICD-10-CM

## 2017-10-17 DIAGNOSIS — L03116 Cellulitis of left lower limb: Secondary | ICD-10-CM

## 2017-10-17 MED ORDER — INDOMETHACIN 50 MG PO CAPS: 50.0000 mg | ORAL_CAPSULE | Freq: Three times a day (TID) | ORAL | 2 refills | Status: DC | PRN

## 2017-10-17 NOTE — Patient Instructions (Signed)

## 2017-10-17 NOTE — Progress Notes (Signed)
Dr. Frederico Hamman T. Jaice Lague, MD, [facility] [place], [address] Phone: [phone] Fax: [phone]  10/17/2017  Patient: CHRISTOFER SHEN, MRN: [id number], DOB: 1966/01/29, 51 y.o.  Primary Physician:  Owens Loffler, MD   Chief Complaint  Patient presents with  . Follow-up    Left Foot Pain & Swollen Left Index finger  . Hypertension   Subjective:   GREGROY DOMBKOWSKI is a 51 y.o. very pleasant male patient who presents with the following:  Complicated patient who presents with a known history of severe gout, recalcitrant to all therapies tried in the past including Uloric, allopurinol, colchicine, Indocin, and various NSAIDS. None have gotten him under control, and he had a GI bleed in 2016 requiring admission and transfusion. He also has seen 2 different Rheumatologists in our region without help.   Today, he was diagnosed 09/24/2017 with cellulitis and placed on Keflex 500 mg QID and has been on this since that time, after f/u with Dr. Damita Dunnings 10/12/2017. [place] eval neg for DVT. His foot redness and pain has improved but there is still some mild swelling.   L finger swollen for 3 months.   L foot swollen for a month. Rx with antibiotics. Initially stayed off of it for 1 1/2 weeks.   Past Medical History, Surgical History, Social History, Family History, Problem List, Medications, and Allergies have been reviewed and updated if relevant.  Patient Active Problem List   Diagnosis Date Noted  . Finger swelling 10/14/2017  . Pain of foot 10/14/2017  . Chronic pain of multiple joints 02/20/2017  . Hiatal hernia 03/09/2016  . Anemia, iron deficiency 03/01/2015  . Gout of multiple sites 03/01/2015  . Essential hypertension 03/04/2014    Past Medical History:  Diagnosis Date  . Alcohol abuse, in remission    quit in 2008  . Bleeding ulcer   . Chronic pain of multiple joints   . Gout of multiple sites 03/01/2015  .  Hiatal hernia   . Tobacco abuse, in remission    Quit 2010--Dipped tin/day x 25 years  . Torn meniscus     Past Surgical History:  Procedure Laterality Date  . APPENDECTOMY  1970's  . HERNIA REPAIR     abdominal    Social History   Socioeconomic History  . Marital status: Married    Spouse name: Not on file  . Number of children: Not on file  . Years of education: Not on file  . Highest education level: Not on file  Social Needs  . Financial resource strain: Not on file  . Food insecurity - worry: Not on file  . Food insecurity - inability: Not on file  . Transportation needs - medical: Not on file  . Transportation needs - non-medical: Not on file  Occupational History  . Not on file  Tobacco Use  . Smoking status: Current Some Day Smoker    Years: 20.00    Types: Cigars    Last attempt to quit: 02/07/2013    Years since quitting: 4.6  . Smokeless tobacco: Current User    Types: Snuff  Substance and Sexual Activity  . Alcohol use: No    Comment: Quit in 2008  . Drug use: No  . Sexual activity: Not Currently  Other Topics Concern  . Not on file  Social History Narrative   Grading work (runs equipment)      Married      Regular  exercise      Quit alcohol;tobacco      No drugs    Family History  Problem Relation Age of Onset  . Lung cancer Mother   . Healthy Father   . Healthy Child   . Healthy Child   . Colon cancer Neg Hx   . Esophageal cancer Neg Hx     Allergies  Allergen Reactions  . Aspirin Other (See Comments)    Ulcer  . Ibuprofen Other (See Comments)    GI/ulcer  . Indomethacin Other (See Comments)    GI/ulcer    Medication list reviewed and updated in full in [place].  GEN: No fevers, chills. Nontoxic. Primarily MSK c/o today. MSK: Detailed in the HPI GI: tolerating PO intake without difficulty Neuro: No numbness, parasthesias, or tingling associated. Otherwise the pertinent positives of the ROS are noted above.    Objective:   BP (!) 130/96   Pulse 97   Temp 98.4 F (36.9 C) (Oral)   Ht 5\' 10"  (1.778 m)   Wt 262 lb 12 oz (119.2 kg)   BMI 37.70 kg/m    GEN: WDWN, NAD, Non-toxic, Alert & Oriented x 3 HEENT: Atraumatic, Normocephalic.  Ears and Nose: No external deformity. EXTR: No clubbing/cyanosis/edema NEURO: Normal gait.  PSYCH: Normally interactive. Conversant. Not depressed or anxious appearing.  Calm demeanor.    Left foot is grossly nontender today, there is some minimal edema only.  Nontender throughout the ankle, midfoot, and forefoot.  There is notable restriction at the first digit, but there is no redness or warmth today.  Nontender along all of the MTPs today.  The index finger on the left is notably swollen compared to the remainder.  There is decreased motion here compared to the remainder of all digits on his hand.  It is mildly tender to palpation only.  There is no significant warmth.  Radiology: Dg Ankle Complete Left  Result Date: 09/24/2017 CLINICAL DATA:  Left foot and ankle pain.  No injury. EXAM: LEFT ANKLE COMPLETE - 3+ VIEW; LEFT FOOT - COMPLETE 3+ VIEW COMPARISON:  None. FINDINGS: No acute fracture or malalignment. Small tibiotalar joint effusion. The talar dome is intact. The ankle mortise is symmetric. Severe joint space loss with bone-on-bone apposition of the first MTP joint. Flattening of the second metatarsal head with secondary joint space loss and osteophyte formation of the second MTP joint. Small plantar enthesophyte. Bone mineralization is normal. Diffuse soft tissue swelling of the foot and along the lateral malleolus. IMPRESSION: 1. Diffuse soft tissue swelling of the foot and along the lateral malleolus. No acute osseous abnormality. 2. Severe first MTP joint degenerative changes. 3. 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 Electronically Signed   By: Titus Dubin  M.D.   On: 09/24/2017 10:30   Dg Finger Index Left  Result Date: 10/12/2017 CLINICAL DATA:  51 year old male with left index finger swelling for the past month EXAM: LEFT INDEX FINGER 2+V COMPARISON:  None. FINDINGS: Diffuse soft tissue swelling about the proximal phalanx and proximal interphalangeal joint, particularly along the palmar aspect of the finger. No radiopaque retained foreign body. There is no evidence of associated fracture, malalignment or osseous lesion. The other visualized bones and joints of the left hand are unremarkable. IMPRESSION: Diffuse soft tissue swelling along the palmar aspect of the index finger without associated bony abnormality. Electronically Signed   By: Dellis Filbert.D.  On: 10/12/2017 14:55   Dg Foot Complete Left  Result Date: 09/24/2017 CLINICAL DATA:  Left foot and ankle pain.  No injury. EXAM: LEFT ANKLE COMPLETE - 3+ VIEW; LEFT FOOT - COMPLETE 3+ VIEW COMPARISON:  None. FINDINGS: No acute fracture or malalignment. Small tibiotalar joint effusion. The talar dome is intact. The ankle mortise is symmetric. Severe joint space loss with bone-on-bone apposition of the first MTP joint. Flattening of the second metatarsal head with secondary joint space loss and osteophyte formation of the second MTP joint. Small plantar enthesophyte. Bone mineralization is normal. Diffuse soft tissue swelling of the foot and along the lateral malleolus. IMPRESSION: 1. Diffuse soft tissue swelling of the foot and along the lateral malleolus. No acute osseous abnormality. 2. Severe first MTP joint degenerative changes. 3. 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 Electronically Signed   By: Titus Dubin M.D.   On: 09/24/2017 10:30    Assessment and Plan:   Idiopathic gout of multiple sites, unspecified chronicity - Plan: Ambulatory referral to Rheumatology  Acute idiopathic gout of  multiple sites - Plan: Ambulatory referral to Rheumatology  Finger swelling - Plan: Ambulatory referral to Rheumatology  Foot swelling - Plan: Ambulatory referral to Rheumatology  Elevated C-reactive protein (CRP) - Plan: Ambulatory referral to Rheumatology  Cellulitis of left foot  >25 minutes spent in face to face time with patient, >50% spent in counselling or coordination of care: extended conversation with the patient and his wife regarding multiple issues.  #1.  Patient's cellulitis appears to be resolved, examination is very reassuring.  I think is reasonable to complete his 5 days of antibiotics.  #2.  He is at wits end and in constant pain secondary to his poorly controlled gout.  We have tried virtually all regimens for control of chronic gout, and none have worked.  Again, we have tried allopurinol, Uloric, and colchicine, and none of these is helped, and in fact allopurinol as well as Uloric have caused him to flare, and he has never been on them for extended periods of time. I gave him some Indocin today, to use no more than 7 days at a time given prior bleed. His wife is also exceedingly frustrated.  I told them that he is the only patient I can think of in my career use gout I have not been able to control.  They request referral to [facility], and I think that that is entirely reasonable.  Follow-up: No Follow-up on file.  Meds ordered this encounter  Medications  . indomethacin (INDOCIN) 50 MG capsule    Sig: Take 1 capsule (50 mg total) 3 (three) times daily as needed by mouth.    Dispense:  60 capsule    Refill:  2   Orders Placed This Encounter  Procedures  . Ambulatory referral to Rheumatology    Signed,  Frederico Hamman T. Jakson Delpilar, MD   Allergies as of 10/17/2017      Reactions   Aspirin Other (See Comments)   Ulcer   Ibuprofen Other (See Comments)   GI/ulcer   Indomethacin Other (See Comments)   GI/ulcer      Medication List        Accurate  as of 10/17/17 11:59 PM. Always use your most recent med list.          cephALEXin 500 MG capsule Commonly known as:  KEFLEX Take 1 capsule (500 mg total) 4 (  four) times daily by mouth.   ibuprofen 200 MG tablet Commonly known as:  ADVIL,MOTRIN Take 200 mg by mouth every 6 (six) hours as needed for moderate pain.   indomethacin 50 MG capsule Commonly known as:  INDOCIN Take 1 capsule (50 mg total) 3 (three) times daily as needed by mouth.

## 2017-11-15 ENCOUNTER — Telehealth: Payer: Self-pay | Admitting: *Deleted

## 2017-11-15 NOTE — Telephone Encounter (Signed)
Received fax from [facility] stating indomethacin is on backordered/unavailable with no release date.  Please advise.

## 2017-11-16 NOTE — Telephone Encounter (Signed)
There is no direct substitution. He could probably call around to different pharmacies to see if they had any available. If not:  Diclofenac 75 mg, 1 po bid, #60, 2 ref  Is probably the closest substitution.

## 2017-11-16 NOTE — Telephone Encounter (Signed)
Per DPR, left detail message of Dr Copland's comments for patient to call back.

## 2017-11-19 DIAGNOSIS — G8929 Other chronic pain: Secondary | ICD-10-CM | POA: Diagnosis not present

## 2017-11-19 DIAGNOSIS — M1990 Unspecified osteoarthritis, unspecified site: Secondary | ICD-10-CM | POA: Diagnosis not present

## 2017-11-19 DIAGNOSIS — M1A09X0 Idiopathic chronic gout, multiple sites, without tophus (tophi): Secondary | ICD-10-CM | POA: Diagnosis not present

## 2017-11-19 DIAGNOSIS — M19072 Primary osteoarthritis, left ankle and foot: Secondary | ICD-10-CM | POA: Diagnosis not present

## 2017-11-19 DIAGNOSIS — M19042 Primary osteoarthritis, left hand: Secondary | ICD-10-CM | POA: Diagnosis not present

## 2017-11-20 MED ORDER — LISINOPRIL 20 MG PO TABS: 20.0000 mg | ORAL_TABLET | Freq: Every day | ORAL | 3 refills | Status: DC

## 2017-11-20 NOTE — Telephone Encounter (Signed)
There is no reason why he can't resume it.   Lisinopril 20 mg, 1 po daily  Electronically prescribe to the pharmacy of their choice. (May call in if pharmacy does not participate in electronic prescriptions) Call in #30, 11 refills. OR if they prefer a 90 day supply, #90 with 3 refills is OK, too Prescription instructions above

## 2017-11-20 NOTE — Telephone Encounter (Signed)
Lisinopril 20 mg sent in as instructed by Dr. Lorelei Pont.  Left message for Mr. [place] that BP medication has been sent to [place].

## 2017-11-20 NOTE — Telephone Encounter (Signed)
Spoke with Mr. Randy Combs.  He states he is okay currently on the Indomethacin.  He will call back if he has a hard time finding it, when he needs it, then we can send in Rx for Diclofenac. He also states that he saw his rheumatologist yesterday that Dr. Lorelei Pont has referred him to and he would like Randy Combs to restart his blood pressure medication he was on in the past due to his BP has been running high. Lisinopril 20 mg is on mediation history.  Please advise.  [facility] uses [facility].

## 2018-03-25 DIAGNOSIS — M1A9XX0 Chronic gout, unspecified, without tophus (tophi): Secondary | ICD-10-CM | POA: Diagnosis not present

## 2018-03-25 DIAGNOSIS — M1990 Unspecified osteoarthritis, unspecified site: Secondary | ICD-10-CM | POA: Diagnosis not present

## 2018-03-25 DIAGNOSIS — M1A09X0 Idiopathic chronic gout, multiple sites, without tophus (tophi): Secondary | ICD-10-CM | POA: Diagnosis not present

## 2018-03-25 DIAGNOSIS — M130 Polyarthritis, unspecified: Secondary | ICD-10-CM | POA: Diagnosis not present

## 2018-03-25 DIAGNOSIS — Z79899 Other long term (current) drug therapy: Secondary | ICD-10-CM | POA: Diagnosis not present

## 2018-04-10 ENCOUNTER — Ambulatory Visit: Payer: 59 | Admitting: Nurse Practitioner

## 2018-04-10 NOTE — Progress Notes (Deleted)
Patient's Name: Randy Combs  MRN: [id number]  Referring Provider: Wendall Mola, MD  DOB: 1965-12-15  PCP: Owens Loffler, MD  DOS: 04/10/2018  Note by: Dionisio David NP  Service setting: Ambulatory outpatient  Specialty: Interventional Pain Management  Location: [facility] (AMB) Pain Management Facility    Patient type: New Patient    Primary Reason(s) for Visit: Initial Patient Evaluation CC: No chief complaint on file.  HPI  Randy Combs is a 52 y.o. year old, male patient, who comes today for an initial evaluation. He has Essential hypertension; Anemia, iron deficiency; Gout of multiple sites; Hiatal hernia; Chronic pain of multiple joints; Finger swelling; and Pain of foot on their problem list.. His primarily concern today is the No chief complaint on file.  Pain Assessment: Location:     Radiating:   Onset:   Duration:   Quality:   Severity:  /10 (subjective, self-reported pain score)  Note: Reported level is compatible with observation.                         When using our objective Pain Scale, levels between 6 and 10/10 are said to belong in an emergency room, as it progressively worsens from a 6/10, described as severely limiting, requiring emergency care not usually available at an outpatient pain management facility. At a 6/10 level, communication becomes difficult and requires great effort. Assistance to reach the emergency department may be required. Facial flushing and profuse sweating along with potentially dangerous increases in heart rate and blood pressure will be evident. Effect on ADL:   Timing:   Modifying factors:   BP:    HR:    Onset and Duration: {Hx; Onset and Duration:210120511} Cause of pain: {Hx; Cause:210120521} Severity: {Pain Severity:210120502} Timing: {Symptoms; Timing:210120501} Aggravating Factors: {Causes; Aggravating pain factors:210120507} Alleviating Factors: {Causes; Alleviating Factors:210120500} Associated Problems: {Hx; Associated  problems:210120515} Quality of Pain: {Hx; Symptom quality or Descriptor:210120531} Previous Examinations or Tests: {Hx; Previous examinations or test:210120529} Previous Treatments: {Hx; Previous Treatment:210120503}  The patient comes into the clinics today for the first time for a chronic pain management evaluation. ***  Today I took the time to provide the patient with information regarding this pain practice. The patient was informed that the practice is divided into two sections: an interventional pain management section, as well as a completely separate and distinct medication management section. I explained that there are procedure days for interventional therapies, and evaluation days for follow-ups and medication management. Because of the amount of documentation required during both, they are kept separated. This means that there is the possibility that *** may be scheduled for a procedure on one day, and medication management the next. I have also informed *** that because of staffing and facility limitations, this practice will no longer take patients for medication management only. To illustrate the reasons for this, I gave the patient the example of surgeons, and how inappropriate it would be to refer a patient to his/her care, just to write for the post-surgical antibiotics on a surgery done by a different surgeon.   Because interventional pain management is part of the board-certified specialty for the doctors, the patient was informed that joining this practice means that they are open to any and all interventional therapies. I made it clear that this does not mean that they will be forced to have any procedures done. What this means is that I believe interventional therapies to be essential part of the diagnosis and proper  management of chronic pain conditions. Therefore, patients not interested in these interventional alternatives will be better served under the care of a different  practitioner.  The patient was also made aware of my Comprehensive Pain Management Safety Guidelines where by joining this practice, they limit all of their nerve blocks and joint injections to those done by our practice, for as long as we are retained to manage their care. Historic Controlled Substance Pharmacotherapy Review  PMP and historical list of controlled substances: oxycodone/acetaminophen 5/325 mg, tramadol 50 mg, hydrocodone/ataminophen 7.5/325 mg,tramadol 50 mg, Highest opioid analgesic regimen found: oxycodone/acetaminophen 5/325 mg 1 tablet every 4 hours (fill date 11/12/2014) oxycodone 30 mg per day Most recent opioid analgesic: oxycodone/acetaminophen 5/325 mg every 4 hours (fill date 03/11/2017) Current opioid analgesics:none Highest recorded MME/day: 50 mg/day MME/day: 0 mg/day Medications: The patient did not bring the medication(s) to the appointment, as requested in our "New Patient Package" Pharmacodynamics: Desired effects: Analgesia: The patient reports >50% benefit. Reported improvement in function: The patient reports medication allows him to accomplish basic ADLs. Clinically meaningful improvement in function (CMIF): Sustained CMIF goals met Perceived effectiveness: Described as relatively effective, allowing for increase in activities of daily living (ADL) Undesirable effects: Side-effects or Adverse reactions: None reported Historical Monitoring: The patient  reports that he does not use drugs. List of all UDS Test(s): No results found for: MDMA, COCAINSCRNUR, PCPSCRNUR, PCPQUANT, CANNABQUANT, THCU, [place] List of all Serum Drug Screening Test(s):  No results found for: AMPHSCRSER, BARBSCRSER, BENZOSCRSER, COCAINSCRSER, PCPSCRSER, PCPQUANT, THCSCRSER, CANNABQUANT, OPIATESCRSER, OXYSCRSER, PROPOXSCRSER Historical Background Evaluation: [place] PDMP: Six (6) year initial data search conducted.             [place] Department of public safety, offender search: Editor, commissioning  Information) Non-contributory Risk Assessment Profile: Aberrant behavior: None observed or detected today Risk factors for fatal opioid overdose: None identified today Fatal overdose hazard ratio (HR): Calculation deferred Non-fatal overdose hazard ratio (HR): Calculation deferred Risk of opioid abuse or dependence: 0.7-3.0% with doses ? 36 MME/day and 6.1-26% with doses ? 120 MME/day. Substance use disorder (SUD) risk level: Pending results of Medical Psychology Evaluation for SUD Opioid risk tool (ORT) (Total Score):    ORT Scoring interpretation table:  Score <3 = Low Risk for SUD  Score between 4-7 = Moderate Risk for SUD  Score >8 = High Risk for Opioid Abuse   PHQ-2 Depression Scale:  Total score:    PHQ-2 Scoring interpretation table: (Score and probability of major depressive disorder)  Score 0 = No depression  Score 1 = 15.4% Probability  Score 2 = 21.1% Probability  Score 3 = 38.4% Probability  Score 4 = 45.5% Probability  Score 5 = 56.4% Probability  Score 6 = 78.6% Probability   PHQ-9 Depression Scale:  Total score:    PHQ-9 Scoring interpretation table:  Score 0-4 = No depression  Score 5-9 = Mild depression  Score 10-14 = Moderate depression  Score 15-19 = Moderately severe depression  Score 20-27 = Severe depression (2.4 times higher risk of SUD and 2.89 times higher risk of overuse)   Pharmacologic Plan: Pending ordered tests and/or consults  Meds  The patient has a current medication list which includes the following prescription(s): cephalexin, ibuprofen, indomethacin, and lisinopril.  Current Outpatient Medications on File Prior to Visit  Medication Sig  . cephALEXin (KEFLEX) 500 MG capsule Take 1 capsule (500 mg total) 4 (four) times daily by mouth.  Marland Kitchen ibuprofen (ADVIL,MOTRIN) 200 MG tablet Take 200 mg by mouth  every 6 (six) hours as needed for moderate pain.  . indomethacin (INDOCIN) 50 MG capsule Take 1 capsule (50 mg total) 3 (three) times daily as  needed by mouth.  Marland Kitchen lisinopril (PRINIVIL,ZESTRIL) 20 MG tablet Take 1 tablet (20 mg total) by mouth daily.   No current facility-administered medications on file prior to visit.    Imaging Review    Knee Imaging: Knee-R MR w contrast: No results found for this or any previous visit. Knee-L MR w contrast:  Results for orders placed during the hospital encounter of 01/18/15  MR Knee Left  Wo Contrast   Narrative CLINICAL DATA:  Severe knee pain for 2 months. Initial encounter. No injury.  EXAM: MRI OF THE LEFT KNEE WITHOUT CONTRAST  TECHNIQUE: Multiplanar, multisequence MR imaging of the knee was performed. No intravenous contrast was administered.  COMPARISON:  None.  FINDINGS: Study moderately degraded by motion artifact which is present on all image sets.  MENISCI  Medial meniscus: Complete avulsion of the medial meniscus posterior horn root with extrusion of the body and maceration of the remnant of the posterior horn and posterior body.  Lateral meniscus:  Fraying of the free edge.  No tear.  LIGAMENTS  Cruciates:  Mucoid degeneration of the ACL.  PCL intact.  Collaterals: Intact with inflammatory changes around the MCL and synovitis deep to the MCL.  CARTILAGE  Patellofemoral: Suboptimally evaluated. Apparent mild osteoarthritis.  Medial: Severe osteoarthritis. Subchondral edema and cystic change with chronic osteoarthritic remodeling of the tibial condyle.  Lateral:  Mild Moderate osteoarthritis.  Joint:  Moderate effusion and synovitis.  Popliteal Fossa: Leaking or ruptured Baker's cyst with fluid tracking along the superficial medial head of the gastrocnemius.  Extensor Mechanism:  Intact.  Prepatellar bursitis.  Bones: No fracture. Subchondral eburnation and edema in the medial tibial condyle is severe.  IMPRESSION: 1. Complete avulsion of the medial meniscus posterior horn root with maceration of the posterior horn and posterior body.  Extrusion of the body. 2. Severe medial compartment osteoarthritis with mild patellofemoral and lateral compartment osteoarthritis. 3. Effusion and leaking or ruptured Baker's cyst. Debris/synovitis in the Baker's cyst. 4. Prepatellar bursitis.   Electronically Signed   By: Dereck Ligas M.D.   On: 01/18/2015 17:30     Note: Available results from prior imaging studies were reviewed.        ROS  Cardiovascular History: {Hx; Cardiovascular History:210120525} Pulmonary or Respiratory History: {Hx; Pumonary and/or Respiratory History:210120523} Neurological History: {Hx; Neurological:210120504} Review of Past Neurological Studies: No results found for this or any previous visit. Psychological-Psychiatric History: {Hx; Psychological-Psychiatric History:210120512} Gastrointestinal History: {Hx; Gastrointestinal:210120527} Genitourinary History: {Hx; Genitourinary:210120506} Hematological History: {Hx; Hematological:210120510} Endocrine History: {Hx; Endocrine history:210120509} Rheumatologic History: {Hx; Rheumatological:210120530} Musculoskeletal History: {Hx; Musculoskeletal:210120528} Work History: {Hx; Work history:210120514}  Allergies  Mr. Bonny is allergic to aspirin; ibuprofen; and indomethacin.  Laboratory Chemistry  Inflammation Markers Lab Results  Component Value Date   CRP 149.0 (H) 09/08/2016   ESRSEDRATE 36 (H) 09/08/2016   (CRP: Acute Phase) (ESR: Chronic Phase) Renal Function Markers Lab Results  Component Value Date   BUN 15 09/24/2017   CREATININE 1.09 09/24/2017   GFRAA >60 09/24/2017   GFRNONAA >60 09/24/2017   Hepatic Function Markers Lab Results  Component Value Date   AST 32 01/19/2009   ALT 33 01/19/2009   ALBUMIN 4.2 01/19/2009   ALKPHOS 54 01/19/2009   Electrolytes Lab Results  Component Value Date   NA 133 (L) 09/24/2017   K 4.1 09/24/2017  CL 100 (L) 09/24/2017   CALCIUM 9.3 09/24/2017   MG 2.1 02/26/2015   Neuropathy  Markers No results found for: [id number] Bone Pathology Markers Lab Results  Component Value Date   ALKPHOS 54 01/19/2009   CALCIUM 9.3 09/24/2017   Coagulation Parameters Lab Results  Component Value Date   INR 0.9 02/22/2015   LABPROT 9.8 02/22/2015   PLT 339 09/24/2017   Cardiovascular Markers Lab Results  Component Value Date   BNP 95.7 02/07/2015   HGB 14.8 09/24/2017   HCT 42.4 09/24/2017   Note: Lab results reviewed.  [place]  Drug: Mr. Kitson  reports that he does not use drugs. Alcohol:  reports that he does not drink alcohol. Tobacco:  reports that he has been smoking cigars.  He has smoked for the past 20.00 years. His smokeless tobacco use includes snuff. Medical:  has a past medical history of Alcohol abuse, in remission, Bleeding ulcer, Chronic pain of multiple joints, Gout of multiple sites (03/01/2015), Hiatal hernia, Tobacco abuse, in remission, and Torn meniscus. Family: family history includes Healthy in his child, child, and father; Lung cancer in his mother.  Past Surgical History:  Procedure Laterality Date  . APPENDECTOMY  1970's  . HERNIA REPAIR     abdominal   Active Ambulatory Problems    Diagnosis Date Noted  . Essential hypertension 03/04/2014  . Anemia, iron deficiency 03/01/2015  . Gout of multiple sites 03/01/2015  . Hiatal hernia 03/09/2016  . Chronic pain of multiple joints 02/20/2017  . Finger swelling 10/14/2017  . Pain of foot 10/14/2017   Resolved Ambulatory Problems    Diagnosis Date Noted  . Gout 01/19/2009  . FATIGUE 01/19/2009  . DYSPHAGIA UNSPECIFIED 01/19/2009  . Podagra 01/12/2012  . Angina, class III ([place]) 02/22/2015  . Acute upper GI bleed 03/01/2015  . Pain in the chest 01/14/2016   Past Medical History:  Diagnosis Date  . Alcohol abuse, in remission   . Bleeding ulcer   . Chronic pain of multiple joints   . Gout of multiple sites 03/01/2015  . Hiatal hernia   . Tobacco abuse, in remission   . Torn meniscus     Constitutional Exam  General appearance: Well nourished, well developed, and well hydrated. In no apparent acute distress There were no vitals filed for this visit. BMI Assessment: Estimated body mass index is 37.7 kg/m as calculated from the following:   Height as of 10/17/17: '5\' 10"'$  (1.778 m).   Weight as of 10/17/17: 262 lb 12 oz (119.2 kg).  BMI interpretation table: BMI level Category Range association with higher incidence of chronic pain  <18 kg/m2 Underweight   18.5-24.9 kg/m2 Ideal body weight   25-29.9 kg/m2 Overweight Increased incidence by 20%  30-34.9 kg/m2 Obese (Class I) Increased incidence by 68%  35-39.9 kg/m2 Severe obesity (Class II) Increased incidence by 136%  >40 kg/m2 Extreme obesity (Class III) Increased incidence by 254%   BMI Readings from Last 4 Encounters:  10/17/17 37.70 kg/m  10/12/17 38.02 kg/m  04/27/17 35.69 kg/m  04/08/17 30.13 kg/m   Wt Readings from Last 4 Encounters:  10/17/17 262 lb 12 oz (119.2 kg)  10/12/17 265 lb (120.2 kg)  04/27/17 248 lb 12 oz (112.8 kg)  04/08/17 210 lb (95.3 kg)  Psych/Mental status: Alert, oriented x 3 (person, place, & time)       Eyes: PERLA Respiratory: No evidence of acute respiratory distress  Cervical Spine Exam  Inspection: No masses, redness, or swelling Alignment:  Symmetrical Functional ROM: Unrestricted ROM      Stability: No instability detected Muscle strength & Tone: Functionally intact Sensory: Unimpaired Palpation: No palpable anomalies              Upper Extremity (UE) Exam    Side: Right upper extremity  Side: Left upper extremity  Inspection: No masses, redness, swelling, or asymmetry. No contractures  Inspection: No masses, redness, swelling, or asymmetry. No contractures  Functional ROM: Unrestricted ROM          Functional ROM: Unrestricted ROM          Muscle strength & Tone: Functionally intact  Muscle strength & Tone: Functionally intact  Sensory: Unimpaired  Sensory:  Unimpaired  Palpation: No palpable anomalies              Palpation: No palpable anomalies              Specialized Test(s): Deferred         Specialized Test(s): Deferred          Thoracic Spine Exam  Inspection: No masses, redness, or swelling Alignment: Symmetrical Functional ROM: Unrestricted ROM Stability: No instability detected Sensory: Unimpaired Muscle strength & Tone: No palpable anomalies  Lumbar Spine Exam  Inspection: No masses, redness, or swelling Alignment: Symmetrical Functional ROM: Unrestricted ROM      Stability: No instability detected Muscle strength & Tone: Functionally intact Sensory: Unimpaired Palpation: No palpable anomalies       Provocative Tests: Lumbar Hyperextension and rotation test: evaluation deferred today       Patrick's Maneuver: evaluation deferred today                    Gait & Posture Assessment  Ambulation: Unassisted Gait: Relatively normal for age and body habitus Posture: WNL   Lower Extremity Exam    Side: Right lower extremity  Side: Left lower extremity  Inspection: No masses, redness, swelling, or asymmetry. No contractures  Inspection: No masses, redness, swelling, or asymmetry. No contractures  Functional ROM: Unrestricted ROM          Functional ROM: Unrestricted ROM          Muscle strength & Tone: Functionally intact  Muscle strength & Tone: Functionally intact  Sensory: Unimpaired  Sensory: Unimpaired  Palpation: No palpable anomalies  Palpation: No palpable anomalies   Assessment  Primary Diagnosis & Pertinent Problem List: There were no encounter diagnoses.  Visit Diagnosis: No diagnosis found. Plan of Care  Initial treatment plan:  Please be advised that as per protocol, today's visit has been an evaluation only. We have not taken over the patient's controlled substance management.  Problem-specific plan: No problem-specific Assessment & Plan notes found for this encounter.  Ordered Lab-work, Procedure(s),  Referral(s), & Consult(s): No orders of the defined types were placed in this encounter.  Pharmacotherapy: Medications ordered:  No orders of the defined types were placed in this encounter.  Medications administered during this visit: Wyett L. Mallen had no medications administered during this visit.   Pharmacotherapy under consideration:  Opioid Analgesics: The patient was informed that there is no guarantee that he would be a candidate for opioid analgesics. The decision will be made following CDC guidelines. This decision will be based on the results of diagnostic studies, as well as Mr. Cid risk profile.  Membrane stabilizer: To be determined at a later time Muscle relaxant: To be determined at a later time NSAID: To be determined at a later time Other  analgesic(s): To be determined at a later time   Interventional therapies under consideration: Mr. Starnes was informed that there is no guarantee that he would be a candidate for interventional therapies. The decision will be based on the results of diagnostic studies, as well as Mr. Bihm risk profile.  Possible procedure(s): ***   Provider-requested follow-up: No follow-ups on file.  Future Appointments  Date Time Provider [place]  04/10/2018  1:00 PM Vevelyn Francois, NP [facility] None    Primary Care Physician: Owens Loffler, MD Location: [facility] Note by:  Date: 04/10/2018; Time: 9:28 AM  Pain Score Disclaimer: We use the NRS-11 scale. This is a self-reported, subjective measurement of pain severity with only modest accuracy. It is used primarily to identify changes within a particular patient. It must be understood that outpatient pain scales are significantly less accurate that those used for research, where they can be applied under ideal controlled circumstances with minimal exposure to variables. In reality, the score is likely to be a combination of pain  intensity and pain affect, where pain affect describes the degree of emotional arousal or changes in action readiness caused by the sensory experience of pain. Factors such as social and work situation, setting, emotional state, anxiety levels, expectation, and prior pain experience may influence pain perception and show large inter-individual differences that may also be affected by time variables.  Patient instructions provided during this appointment: There are no Patient Instructions on file for this visit.

## 2018-06-01 ENCOUNTER — Other Ambulatory Visit: Payer: Self-pay | Admitting: Family Medicine

## 2018-06-03 NOTE — Telephone Encounter (Signed)
Last office visit 10/17/2017.  Directions say to take 3 times a day prn. Quantity #60.  Last refilled 10/17/17 for #60 with 2 refills.  Ok to refill?  Should we increase quantity to #90 or leave at #60??

## 2018-06-09 ENCOUNTER — Emergency Department (HOSPITAL_COMMUNITY)
Admission: EM | Admit: 2018-06-09 | Discharge: 2018-06-09 | Disposition: A | Discharge status: Home / Self Care | Payer: 59 | Attending: Emergency Medicine | Admitting: Emergency Medicine

## 2018-06-09 ENCOUNTER — Other Ambulatory Visit: Payer: Self-pay

## 2018-06-09 ENCOUNTER — Encounter (HOSPITAL_COMMUNITY): Payer: Self-pay | Admitting: Emergency Medicine

## 2018-06-09 ENCOUNTER — Emergency Department (HOSPITAL_COMMUNITY): Payer: 59

## 2018-06-09 DIAGNOSIS — I10 Essential (primary) hypertension: Secondary | ICD-10-CM | POA: Diagnosis not present

## 2018-06-09 DIAGNOSIS — R079 Chest pain, unspecified: Secondary | ICD-10-CM | POA: Diagnosis not present

## 2018-06-09 DIAGNOSIS — R55 Syncope and collapse: Secondary | ICD-10-CM | POA: Diagnosis not present

## 2018-06-09 DIAGNOSIS — R0602 Shortness of breath: Secondary | ICD-10-CM | POA: Insufficient documentation

## 2018-06-09 DIAGNOSIS — Z79899 Other long term (current) drug therapy: Secondary | ICD-10-CM | POA: Insufficient documentation

## 2018-06-09 DIAGNOSIS — R0789 Other chest pain: Secondary | ICD-10-CM | POA: Diagnosis not present

## 2018-06-09 DIAGNOSIS — F17220 Nicotine dependence, chewing tobacco, uncomplicated: Secondary | ICD-10-CM | POA: Diagnosis not present

## 2018-06-09 DIAGNOSIS — R531 Weakness: Secondary | ICD-10-CM | POA: Diagnosis not present

## 2018-06-09 HISTORY — DX: Essential (primary) hypertension: I10

## 2018-06-09 LAB — BASIC METABOLIC PANEL
Anion gap: 9 (ref 5–15)
BUN: 26 mg/dL — AB (ref 6–20)
CHLORIDE: 108 mmol/L (ref 98–111)
CO2: 23 mmol/L (ref 22–32)
Calcium: 9.6 mg/dL (ref 8.9–10.3)
Creatinine, Ser: 1.18 mg/dL (ref 0.61–1.24)
GFR calc Af Amer: >60 mL/min (ref >60)
Glucose, Bld: 110 mg/dL — ABNORMAL HIGH (ref 70–99)
Potassium: 4.6 mmol/L (ref 3.5–5.1)
SODIUM: 140 mmol/L (ref 135–145)

## 2018-06-09 LAB — TROPONIN I
Troponin I: <0.03 ng/mL (ref <0.03)
Troponin I: <0.03 ng/mL (ref <0.03)

## 2018-06-09 LAB — CBC
HCT: 44.4 % (ref 39.0–52.0)
Hemoglobin: 15.4 g/dL (ref 13.0–17.0)
MCH: 34.1 pg — ABNORMAL HIGH (ref 26.0–34.0)
MCHC: 34.7 g/dL (ref 30.0–36.0)
MCV: 98.4 fL (ref 78.0–100.0)
PLATELETS: 270 10*3/uL (ref 150–400)
RBC: 4.51 MIL/uL (ref 4.22–5.81)
RDW: 12.6 % (ref 11.5–15.5)
WBC: 6.5 10*3/uL (ref 4.0–10.5)

## 2018-06-09 LAB — D-DIMER, QUANTITATIVE (NOT AT ARMC): D DIMER QUANT: 0.42 ug{FEU}/mL (ref 0.00–0.50)

## 2018-06-09 LAB — BRAIN NATRIURETIC PEPTIDE: B Natriuretic Peptide: 23 pg/mL (ref 0.0–100.0)

## 2018-06-09 MED ORDER — SODIUM CHLORIDE 0.9 % IV BOLUS (SEPSIS)
1000.0000 mL | Freq: Once | INTRAVENOUS | Status: AC
  Administered 2018-06-09: 1000 mL via INTRAVENOUS

## 2018-06-09 NOTE — ED Provider Notes (Signed)
[facility] EMERGENCY DEPARTMENT Provider Note   CSN: [id number] Arrival date & time: 06/09/18  1024     History   Chief Complaint Chief Complaint  Patient presents with  . Chest Pain    HPI Randy Combs is a 52 y.o. male  With hx of ETOH abuse, in remission, bleeding ulcer, chronic pain, gout, Hiatal hernia and HTN who presents to the ED with shortness of breath, chest pressure and weakness. Pt reports he was outside 4 days ago at work and felt like he may have gotten to hot, he states his knees were shaking and vision was blurry.  Pt states he has felt bad since then. States he is weak and has chest pressure in center of chest.    HPI  Past Medical History:  Diagnosis Date  . Alcohol abuse, in remission    quit in 2008  . Bleeding ulcer   . Chronic pain of multiple joints   . Gout of multiple sites 03/01/2015  . Hiatal hernia   . Hiatal hernia   . Hypertension   . Tobacco abuse, in remission    Quit 2010--Dipped tin/day x 25 years  . Torn meniscus     Patient Active Problem List   Diagnosis Date Noted  . Finger swelling 10/14/2017  . Pain of foot 10/14/2017  . Chronic pain of multiple joints 02/20/2017  . Hiatal hernia 03/09/2016  . Anemia, iron deficiency 03/01/2015  . Gout of multiple sites 03/01/2015  . Essential hypertension 03/04/2014    Past Surgical History:  Procedure Laterality Date  . APPENDECTOMY  1970's  . HERNIA REPAIR     abdominal        Home Medications    Prior to Admission medications   Medication Sig Start Date End Date Taking? Authorizing Provider  indomethacin (INDOCIN) 50 MG capsule TAKE 1 CAPSULE BY MOUTH THREE TIMES A DAY AS NEEDED 06/03/18  Yes Copland, Frederico Hamman, MD  lisinopril (PRINIVIL,ZESTRIL) 20 MG tablet Take 1 tablet (20 mg total) by mouth daily. 11/20/17  Yes Copland, Frederico Hamman, MD    Family History Family History  Problem Relation Age of Onset  . Lung cancer Mother   . Healthy Father   . Healthy Child   . Healthy  Child   . Colon cancer Neg Hx   . Esophageal cancer Neg Hx     Social History Social History   Tobacco Use  . Smoking status: Never Smoker  . Smokeless tobacco: Current User    Types: Snuff  Substance Use Topics  . Alcohol use: No    Comment: Quit in 2008  . Drug use: No     Allergies   Aspirin   Review of Systems Review of Systems  Constitutional: Negative for chills and fever.  HENT: Negative.   Eyes: Positive for visual disturbance.  Respiratory: Positive for shortness of breath.   Cardiovascular: Positive for chest pain.  Gastrointestinal: Negative for abdominal pain, nausea and vomiting.  Genitourinary: Negative for dysuria, frequency and urgency.  Musculoskeletal: Negative for neck pain. Back pain: chronic.  Skin: Negative for rash.  Neurological: Positive for dizziness. Negative for syncope and headaches.  Psychiatric/Behavioral: Negative for confusion.     Physical Exam Updated Vital Signs BP 122/81   Pulse 65   Temp 98.2 F (36.8 C) (Oral)   Resp 20   Ht 5\' 10"  (1.778 m)   Wt 117 kg (258 lb)   SpO2 97%   BMI 37.02 kg/m   Physical Exam  Constitutional: He is oriented to person, place, and time. He appears well-developed and well-nourished. No distress.  HENT:  Head: Normocephalic and atraumatic.  Eyes: Conjunctivae and EOM are normal.  Neck: Trachea normal and normal range of motion. Neck supple. Normal carotid pulses and no JVD present. No spinous process tenderness and no muscular tenderness present. Carotid bruit is not present.  Cardiovascular: Normal rate and regular rhythm.  Pulmonary/Chest: Effort normal. He has no wheezes. He has no rales.  Abdominal: Soft. Bowel sounds are normal. There is no tenderness.  Musculoskeletal: Normal range of motion.  Neurological: He is alert and oriented to person, place, and time.  Skin: Skin is warm and dry.  Psychiatric: He has a normal mood and affect. His behavior is normal.  Nursing note and vitals  reviewed.    ED Treatments / Results  Labs (all labs ordered are listed, but only abnormal results are displayed) Labs Reviewed  BASIC METABOLIC PANEL - Abnormal; Notable for the following components:      Result Value   Glucose, Bld 110 (*)    BUN 26 (*)    All other components within normal limits  CBC - Abnormal; Notable for the following components:   MCH 34.1 (*)    All other components within normal limits  TROPONIN I  BRAIN NATRIURETIC PEPTIDE  D-DIMER, QUANTITATIVE (NOT AT [facility])  TROPONIN I    EKG EKG Interpretation  Date/Time:  Sunday June 09 2018 10:31:19 EDT Ventricular Rate:  74 PR Interval:    QRS Duration: 142 QT Interval:  386 QTC Calculation: 429 R Axis:   -60 Text Interpretation:  Sinus rhythm RBBB and LAFB RBBB and LAFB noted on ECG on Feb 07, 2015 but new since ECG dated 23 Feb 2015 Confirmed by Dorie Rank [phone]) on 06/09/2018 10:39:51 AM   Radiology Dg Chest 2 View  Result Date: 06/09/2018 CLINICAL DATA:  Chest pressure.  Hypertension. EXAM: CHEST - 2 VIEW COMPARISON:  May 03, 2016 FINDINGS: There is no edema or consolidation. The heart size and pulmonary vascularity are normal. No adenopathy. No pneumothorax. No bone lesions. IMPRESSION: No edema or consolidation. Electronically Signed   By: Lowella Grip III M.D.   On: 06/09/2018 12:08    Procedures Procedures (including critical care time)  Medications Ordered in ED Medications  sodium chloride 0.9 % bolus 1,000 mL (0 mLs Intravenous Stopped 06/09/18 1316)  12:30 pm patient hypotensive, IV fluids started  2:30 pm patient states he feels a little better after fluids. Had patient to stand and he felt dizzy again. RN to do orthostatic VS. Patient is not orthostatic. I discussed this case with Dr. Tomi Bamberger.   Initial Impression / Assessment and Plan / ED Course  I have reviewed the triage vital signs and the nursing notes. 52 y.o. male here with near syncopal episode stable for d/c. He is not  orthostatic. Discussed with the patient possible reasons for dizziness including vertigo, dehydration. Patient works in [employer] in the [place]. Patient to f/u with his PCP before returning to work. He will return here for worsening symptoms.   Final Clinical Impressions(s) / ED Diagnoses   Final diagnoses:  Near syncope  Nonspecific chest pain    ED Discharge Orders    None       Debroah Baller [place], [place] 06/09/18 1709    Dorie Rank, MD 06/11/18 1316

## 2018-06-09 NOTE — ED Triage Notes (Signed)
Pt reports he was outside and felt like he may have gotten to hot, pt reports he has felt bad since then.  His knees were shaking and vision was blurry.  Pt states he has felt bad since Wednesday.  Says he has not felt any better since then.  States he is weak and has chest pressure in center of chest.

## 2018-06-09 NOTE — Discharge Instructions (Addendum)
Call your doctor tomorrow for follow up. Return for worsening symptoms.

## 2018-06-14 ENCOUNTER — Ambulatory Visit: Payer: 59 | Admitting: Internal Medicine

## 2018-06-19 ENCOUNTER — Ambulatory Visit: Payer: 59 | Admitting: Family Medicine

## 2018-07-25 DIAGNOSIS — K219 Gastro-esophageal reflux disease without esophagitis: Secondary | ICD-10-CM | POA: Diagnosis not present

## 2018-07-25 DIAGNOSIS — M1A09X0 Idiopathic chronic gout, multiple sites, without tophus (tophi): Secondary | ICD-10-CM | POA: Diagnosis not present

## 2018-07-25 DIAGNOSIS — M130 Polyarthritis, unspecified: Secondary | ICD-10-CM | POA: Diagnosis not present

## 2018-07-25 DIAGNOSIS — M150 Primary generalized (osteo)arthritis: Secondary | ICD-10-CM | POA: Diagnosis not present

## 2018-07-30 DIAGNOSIS — R079 Chest pain, unspecified: Secondary | ICD-10-CM | POA: Diagnosis not present

## 2018-07-30 DIAGNOSIS — N179 Acute kidney failure, unspecified: Secondary | ICD-10-CM | POA: Diagnosis not present

## 2018-07-30 DIAGNOSIS — R0789 Other chest pain: Secondary | ICD-10-CM | POA: Diagnosis not present

## 2018-07-30 DIAGNOSIS — I959 Hypotension, unspecified: Secondary | ICD-10-CM | POA: Diagnosis not present

## 2018-09-24 IMAGING — DX DG ANKLE COMPLETE 3+V*L*
3 series · 3 of 3 positions shown · non-contrast
Comparison: None.

CLINICAL DATA: Left foot and ankle pain.  No injury.

EXAM:
LEFT ANKLE COMPLETE - 3+ VIEW; LEFT FOOT - COMPLETE 3+ VIEW

[ankle ap]
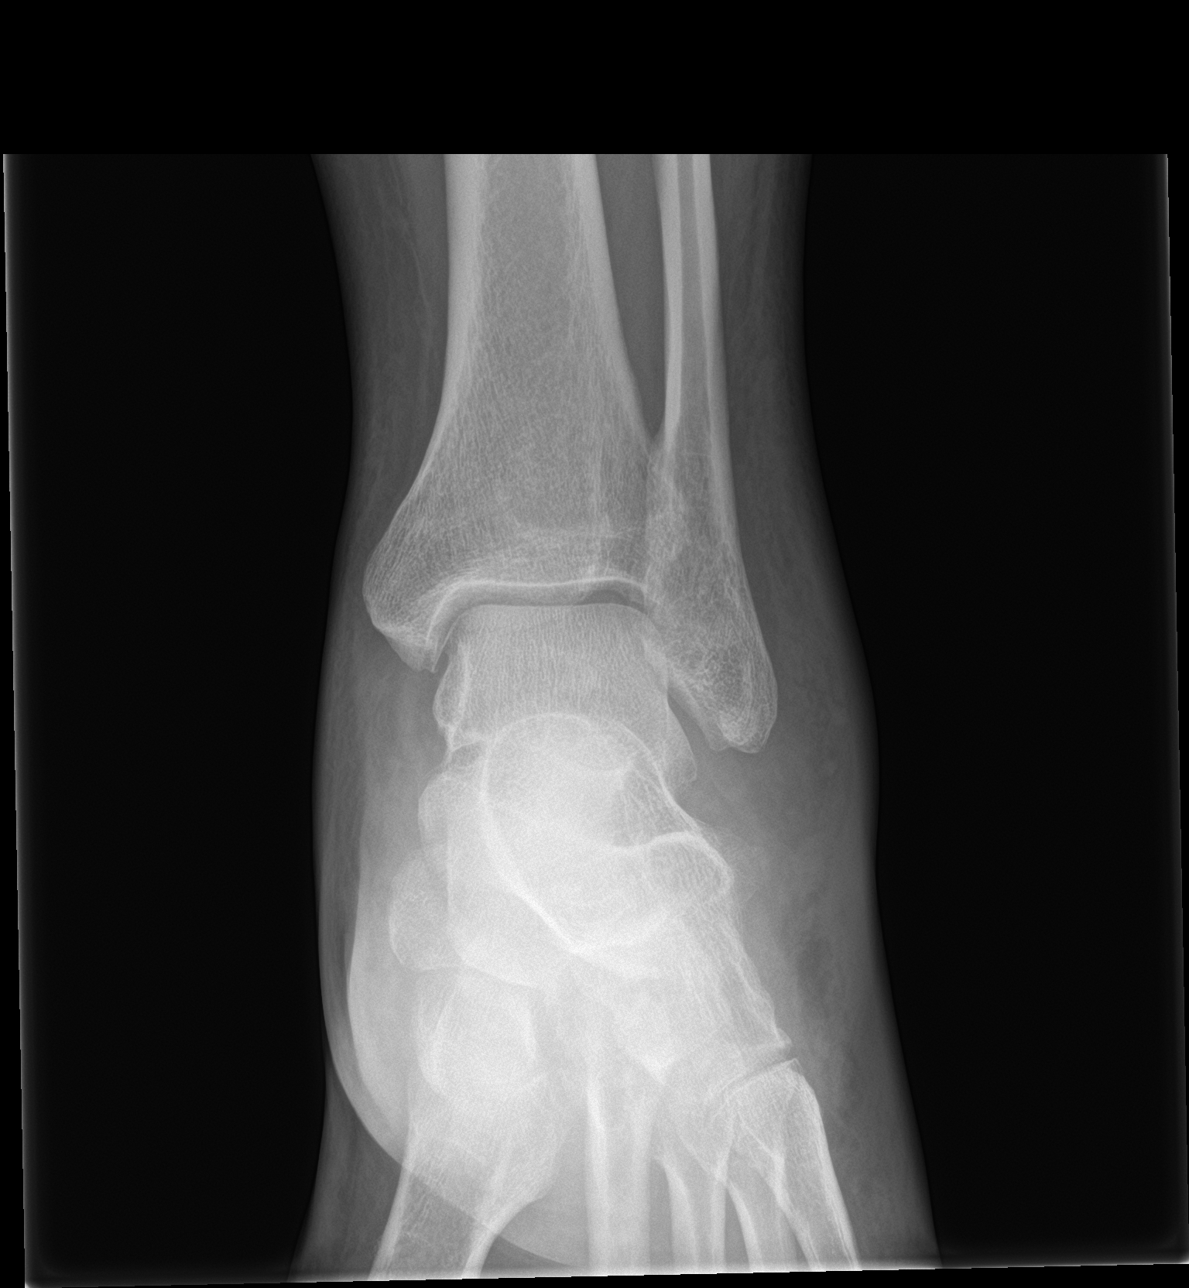

[ankle obl]
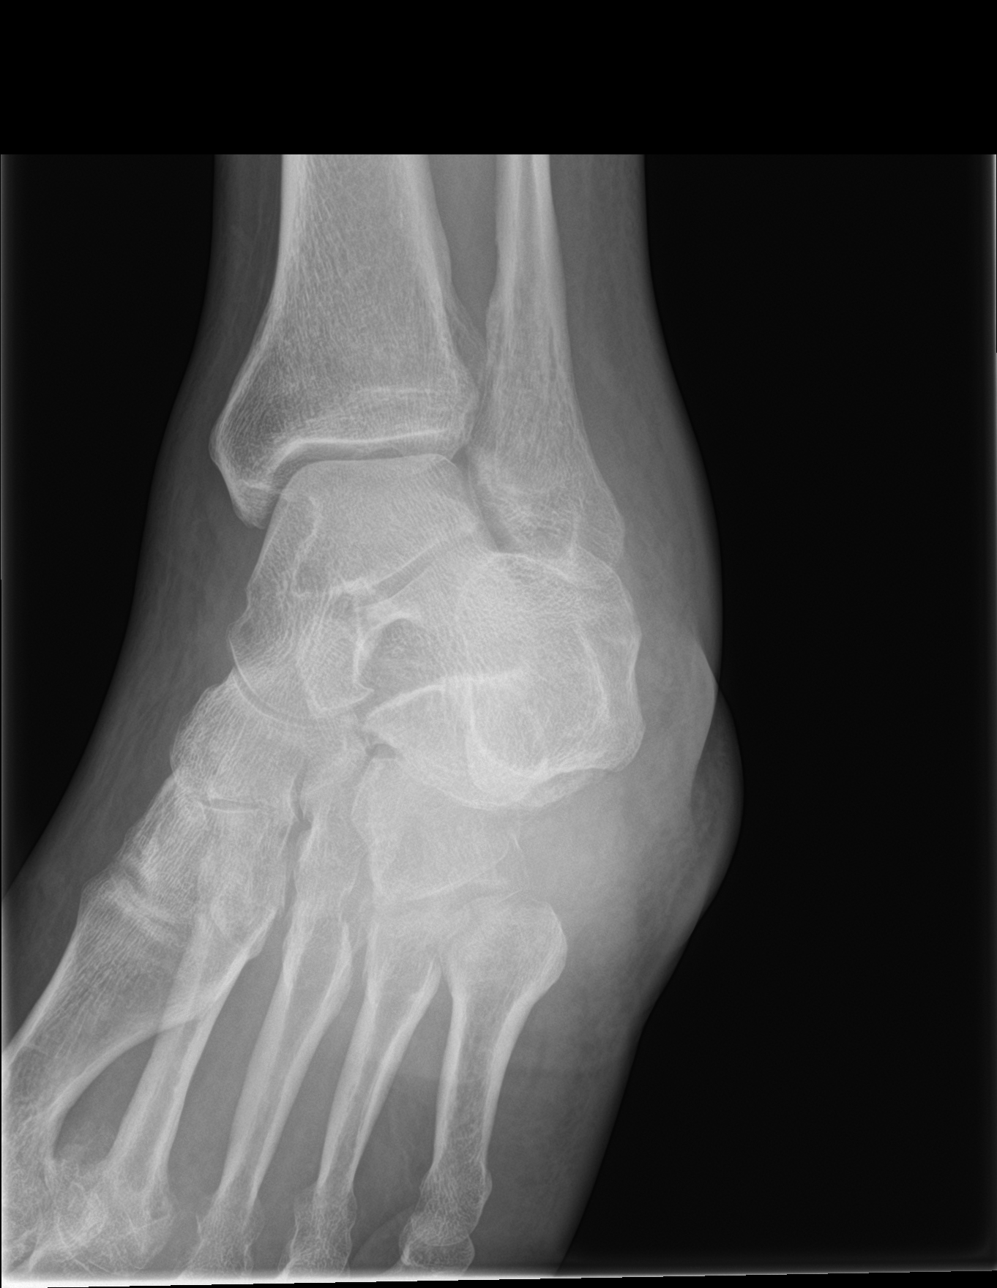

[ankle lat]
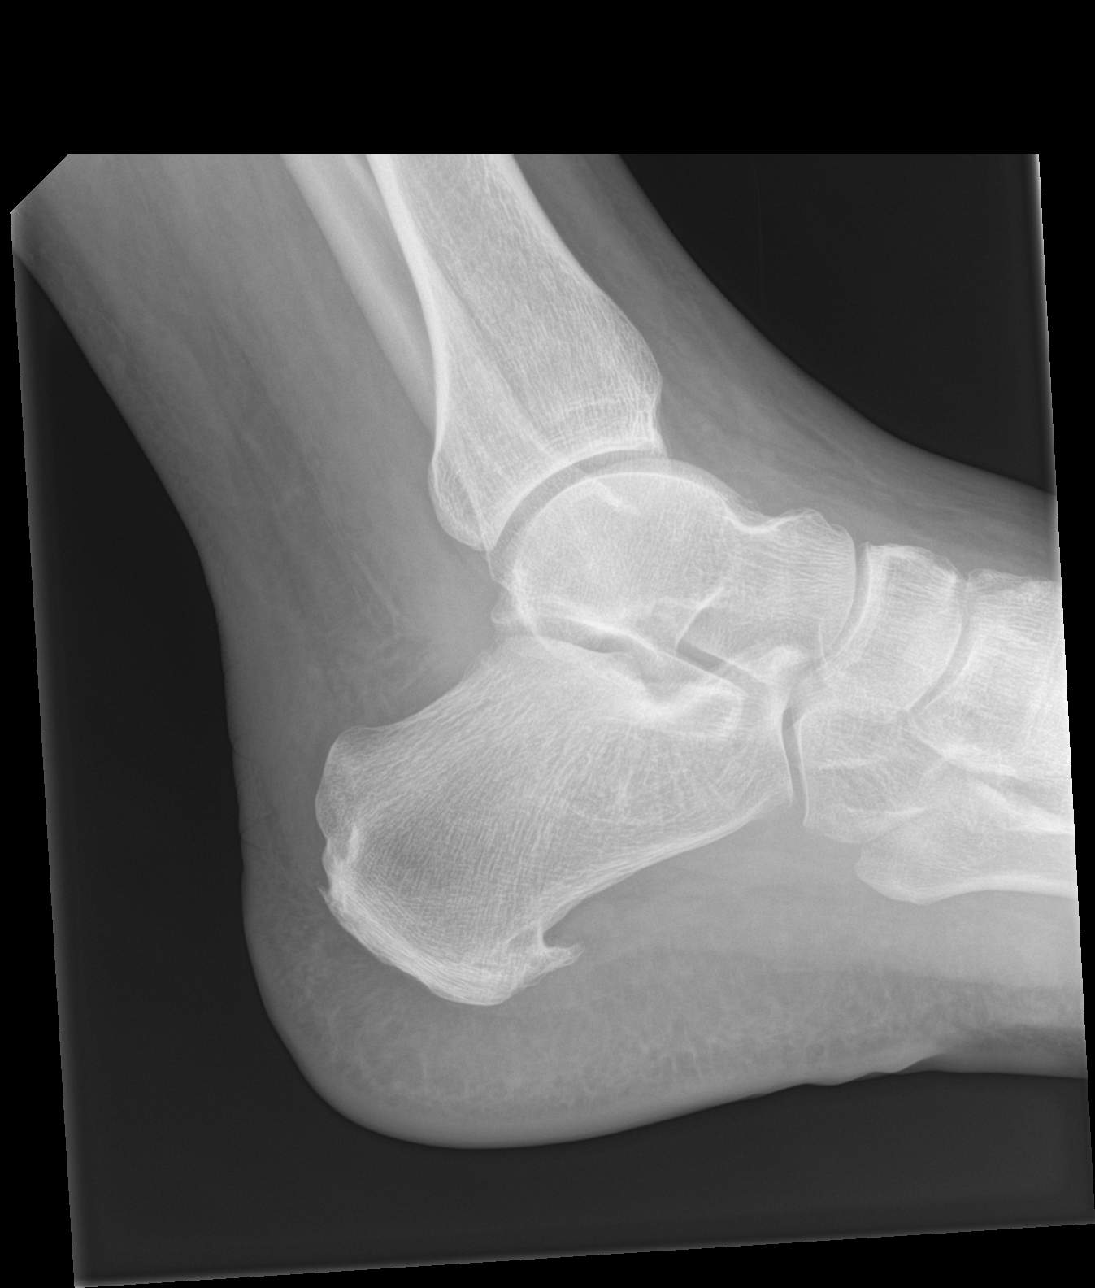

[3 of 3 positions shown; findings below may reference images not displayed]

FINDINGS: No acute fracture or malalignment. Small tibiotalar joint effusion.
The talar dome is intact. The ankle mortise is symmetric. Severe
joint space loss with bone-on-bone apposition of the first MTP
joint. Flattening of the second metatarsal head with secondary joint
space loss and osteophyte formation of the second MTP joint. Small
plantar enthesophyte. Bone mineralization is normal. Diffuse soft
tissue swelling of the foot and along the lateral malleolus.
IMPRESSION: 1. Diffuse soft tissue swelling of the foot and along the lateral
malleolus. No acute osseous abnormality.
2. Severe first MTP joint degenerative changes.
3. Likely sequelae of prior osteonecrosis of the second metatarsal
head with secondary mild to moderate MTP joint degenerative changes.

## 2018-10-28 ENCOUNTER — Telehealth: Payer: Self-pay | Admitting: Family Medicine

## 2018-10-28 DIAGNOSIS — Z1211 Encounter for screening for malignant neoplasm of colon: Secondary | ICD-10-CM

## 2018-10-28 NOTE — Telephone Encounter (Signed)
Pt called office wanting to schedule a colonoscopy. Pt says he has never had a colonoscopy before. Please advise

## 2018-10-29 NOTE — Telephone Encounter (Signed)
Done

## 2018-10-29 NOTE — Addendum Note (Signed)
Addended by: Eliezer Lofts E on: 10/29/2018 12:37 PM   Modules accepted: Orders

## 2018-10-29 NOTE — Telephone Encounter (Signed)
Will route to Dr. Diona Browner to enter referral.

## 2018-11-13 ENCOUNTER — Encounter: Payer: Self-pay | Admitting: Gastroenterology

## 2018-12-16 ENCOUNTER — Encounter: Payer: Self-pay | Admitting: Gastroenterology

## 2018-12-16 ENCOUNTER — Other Ambulatory Visit: Payer: Self-pay | Admitting: Family Medicine

## 2018-12-16 ENCOUNTER — Ambulatory Visit (AMBULATORY_SURGERY_CENTER): Payer: Self-pay

## 2018-12-16 VITALS — Ht 70.0 in | Wt 266.0 lb

## 2018-12-16 DIAGNOSIS — Z1211 Encounter for screening for malignant neoplasm of colon: Secondary | ICD-10-CM

## 2018-12-16 MED ORDER — NA SULFATE-K SULFATE-MG SULF 17.5-3.13-1.6 GM/177ML PO SOLN: 1.0000 | Freq: Once | ORAL | 0 refills | Status: AC

## 2018-12-16 NOTE — Telephone Encounter (Signed)
Last office visit 10/17/2017 for Idiopathic Gout.  Last refilled 06/03/2018 for #60 with 2 refills.  No future appointments.  Refill?

## 2018-12-16 NOTE — Progress Notes (Signed)
Denies allergies to eggs or soy products. Denies complication of anesthesia or sedation. Denies use of weight loss medication. Denies use of O2.   Emmi instructions declined.   A pay no more than 50.00 coupon for Suprep was given to the patient.  

## 2018-12-30 ENCOUNTER — Encounter: Payer: Self-pay | Admitting: *Deleted

## 2018-12-30 ENCOUNTER — Encounter: Payer: Self-pay | Admitting: Gastroenterology

## 2018-12-30 ENCOUNTER — Ambulatory Visit (AMBULATORY_SURGERY_CENTER): Payer: 59 | Admitting: Gastroenterology

## 2018-12-30 VITALS — BP 111/82 | HR 61 | Temp 97.5°F | Resp 18 | Ht 70.0 in | Wt 266.0 lb

## 2018-12-30 DIAGNOSIS — D125 Benign neoplasm of sigmoid colon: Secondary | ICD-10-CM | POA: Diagnosis not present

## 2018-12-30 DIAGNOSIS — Z1211 Encounter for screening for malignant neoplasm of colon: Secondary | ICD-10-CM | POA: Diagnosis not present

## 2018-12-30 DIAGNOSIS — D124 Benign neoplasm of descending colon: Secondary | ICD-10-CM

## 2018-12-30 DIAGNOSIS — D128 Benign neoplasm of rectum: Secondary | ICD-10-CM | POA: Diagnosis not present

## 2018-12-30 DIAGNOSIS — D127 Benign neoplasm of rectosigmoid junction: Secondary | ICD-10-CM | POA: Diagnosis not present

## 2018-12-30 MED ORDER — SODIUM CHLORIDE 0.9 % IV SOLN: 500.0000 mL | Freq: Once | INTRAVENOUS | Status: DC

## 2018-12-30 NOTE — Progress Notes (Signed)
To PACU VSS. Report to [email or web address] 

## 2018-12-30 NOTE — Op Note (Signed)
[place] Patient Name: Randy Combs Procedure Date: 12/30/2018 8:35 AM MRN: [id number] Endoscopist: Ladene Artist , MD Age: 53 Referring MD:  Date of Birth: Mar 14, 1966 Gender: Male Account #: [id number] Procedure:                Colonoscopy Indications:              Screening for colorectal malignant neoplasm Medicines:                Monitored Anesthesia Care Procedure:                Pre-Anesthesia Assessment:                           - Prior to the procedure, a History and Physical                            was performed, and patient medications and                            allergies were reviewed. The patient's tolerance of                            previous anesthesia was also reviewed. The risks                            and benefits of the procedure and the sedation                            options and risks were discussed with the patient.                            All questions were answered, and informed consent                            was obtained. Prior Anticoagulants: The patient has                            taken no previous anticoagulant or antiplatelet                            agents. ASA Grade Assessment: II - A patient with                            mild systemic disease. After reviewing the risks                            and benefits, the patient was deemed in                            satisfactory condition to undergo the procedure.                           After obtaining informed consent, the colonoscope  was passed under direct vision. Throughout the                            procedure, the patient's blood pressure, pulse, and                            oxygen saturations were monitored continuously. The                            Model CF-[id number]) scope was introduced                            through the anus and advanced to the the cecum,                            identified by  appendiceal orifice and ileocecal                            valve. The ileocecal valve, appendiceal orifice,                            and rectum were photographed. The quality of the                            bowel preparation was good. The colonoscopy was                            performed without difficulty. The patient tolerated                            the procedure well. Scope In: 8:40:23 AM Scope Out: 9:00:51 AM Scope Withdrawal Time: 0 hours 18 minutes 24 seconds  Total Procedure Duration: 0 hours 20 minutes 28 seconds  Findings:                 The perianal and digital rectal examinations were                            normal.                           Five sessile polyps were found in the rectum (3),                            sigmoid colon (1) and descending colon (1). The                            polyps were 5 to 7 mm in size. These polyps were                            removed with a cold snare. Resection and retrieval                            were complete.  Internal hemorrhoids were found during                            retroflexion. The hemorrhoids were Grade I                            (internal hemorrhoids that do not prolapse).                           The exam was otherwise without abnormality on                            direct and retroflexion views. Complications:            No immediate complications. Estimated blood loss:                            None. Estimated Blood Loss:     Estimated blood loss: none. Impression:               - Five 5 to 7 mm polyps in the rectum, in the                            sigmoid colon and in the descending colon, removed                            with a cold snare. Resected and retrieved.                           - Internal hemorrhoids.                           - The examination was otherwise normal on direct                            and retroflexion views. Recommendation:            - Repeat colonoscopy in 3 - 5 years for                            surveillance if polyp(s) are precancerous,                            otherwise 10 years.                           - Patient has a contact number available for                            emergencies. The signs and symptoms of potential                            delayed complications were discussed with the                            patient. Return to normal activities tomorrow.  Written discharge instructions were provided to the                            patient.                           - Resume previous diet.                           - Continue present medications.                           - Await pathology results. Ladene Artist, MD 12/30/2018 9:04:38 AM This report has been signed electronically.

## 2018-12-30 NOTE — Progress Notes (Signed)
No problems noted in the recovery room. maw 

## 2018-12-30 NOTE — Patient Instructions (Signed)
YOU HAD AN ENDOSCOPIC PROCEDURE TODAY AT THE [facility]:   Refer to the procedure report that was given to you for any specific questions about what was found during the examination.  If the procedure report does not answer your questions, please call your gastroenterologist to clarify.  If you requested that your care partner not be given the details of your procedure findings, then the procedure report has been included in a sealed envelope for you to review at your convenience later.  YOU SHOULD EXPECT: Some feelings of bloating in the abdomen. Passage of more gas than usual.  Walking can help get rid of the air that was put into your GI tract during the procedure and reduce the bloating. If you had a lower endoscopy (such as a colonoscopy or flexible sigmoidoscopy) you may notice spotting of blood in your stool or on the toilet paper. If you underwent a bowel prep for your procedure, you may not have a normal bowel movement for a few days.  Please Note:  You might notice some irritation and congestion in your nose or some drainage.  This is from the oxygen used during your procedure.  There is no need for concern and it should clear up in a day or so.  SYMPTOMS TO REPORT IMMEDIATELY:   Following lower endoscopy (colonoscopy or flexible sigmoidoscopy):  Excessive amounts of blood in the stool  Significant tenderness or worsening of abdominal pains  Swelling of the abdomen that is new, acute  Fever of 100F or higher   For urgent or emergent issues, a gastroenterologist can be reached at any hour by calling [phone].   DIET:  We do recommend a small meal at first, but then you may proceed to your regular diet.  Drink plenty of fluids but you should avoid alcoholic beverages for 24 hours.  ACTIVITY:  You should plan to take it easy for the rest of today and you should NOT DRIVE or use heavy machinery until tomorrow (because of the sedation medicines used during the test).     FOLLOW UP: Our staff will call the number listed on your records the next business day following your procedure to check on you and address any questions or concerns that you may have regarding the information given to you following your procedure. If we do not reach you, we will leave a message.  However, if you are feeling well and you are not experiencing any problems, there is no need to return our call.  We will assume that you have returned to your regular daily activities without incident.  If any biopsies were taken you will be contacted by phone or by letter within the next 1-3 weeks.  Please call us at [phone] if you have not heard about the biopsies in 3 weeks.    SIGNATURES/CONFIDENTIALITY: You and/or your care partner have signed paperwork which will be entered into your electronic medical record.  These signatures attest to the fact that that the information above on your After Visit Summary has been reviewed and is understood.  Full responsibility of the confidentiality of this discharge information lies with you and/or your care-partner.    Handouts were given to your care partner on polyps and hemorrhoids. You may resume your current medications today. Await biopsy results. Please call if any questions or concerns.   

## 2018-12-31 ENCOUNTER — Telehealth: Payer: Self-pay

## 2018-12-31 NOTE — Telephone Encounter (Signed)
  Follow up Call-  Call back number 12/30/2018  Post procedure Call Back phone  # [phone]  Permission to leave phone message Yes  Some recent data might be hidden     Patient questions:  Do you have a fever, pain , or abdominal swelling? No. Pain Score  0 *  Have you tolerated food without any problems? Yes.    Have you been able to return to your normal activities? Yes.    Do you have any questions about your discharge instructions: Diet   No. Medications  No. Follow up visit  No.  Do you have questions or concerns about your Care? No.  Actions: * If pain score is 4 or above: No action needed, pain <4.

## 2019-01-02 ENCOUNTER — Encounter: Payer: Self-pay | Admitting: Gastroenterology

## 2019-03-15 ENCOUNTER — Telehealth: Payer: Self-pay | Admitting: Family Medicine

## 2019-03-17 NOTE — Telephone Encounter (Signed)
Last office visit 10/17/2017 for Idiopathic gout.  Last refilled 12/16/2018 for #60 with 2 refills.  No future appointments . Refill?  Instructions say to take three times a day prn.  Increase quantity to #90???

## 2019-03-19 NOTE — Telephone Encounter (Signed)
Left message asking pt to call office  °

## 2019-03-20 NOTE — Telephone Encounter (Signed)
Labs7/17 Cpx 7/22 Pt aware

## 2019-06-09 IMAGING — DX DG CHEST 2V
2 series · 2 of 2 positions shown · non-contrast
Comparison: May 03, 2016

CLINICAL DATA: Chest pressure.  Hypertension.

EXAM:
CHEST - 2 VIEW

[chest pa]
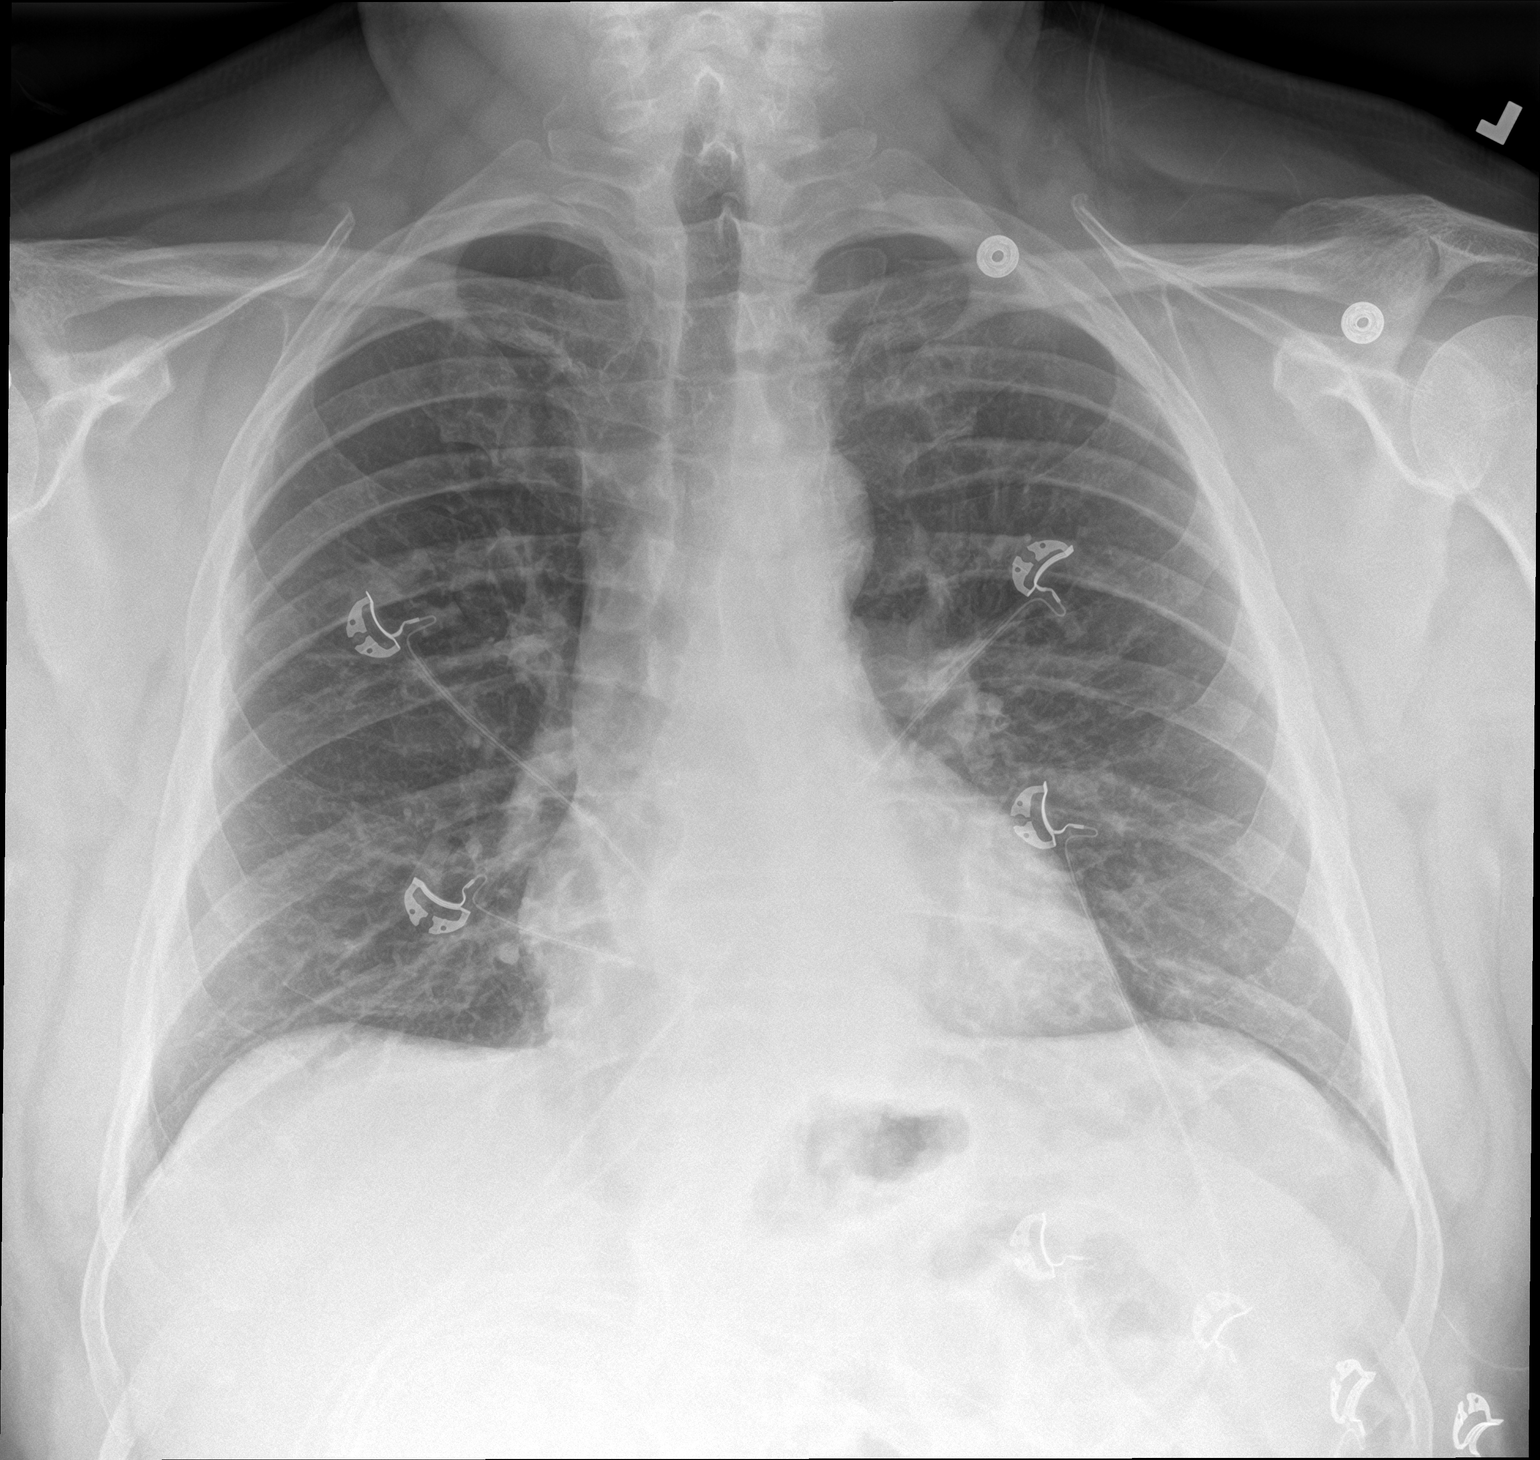

[chest lat]
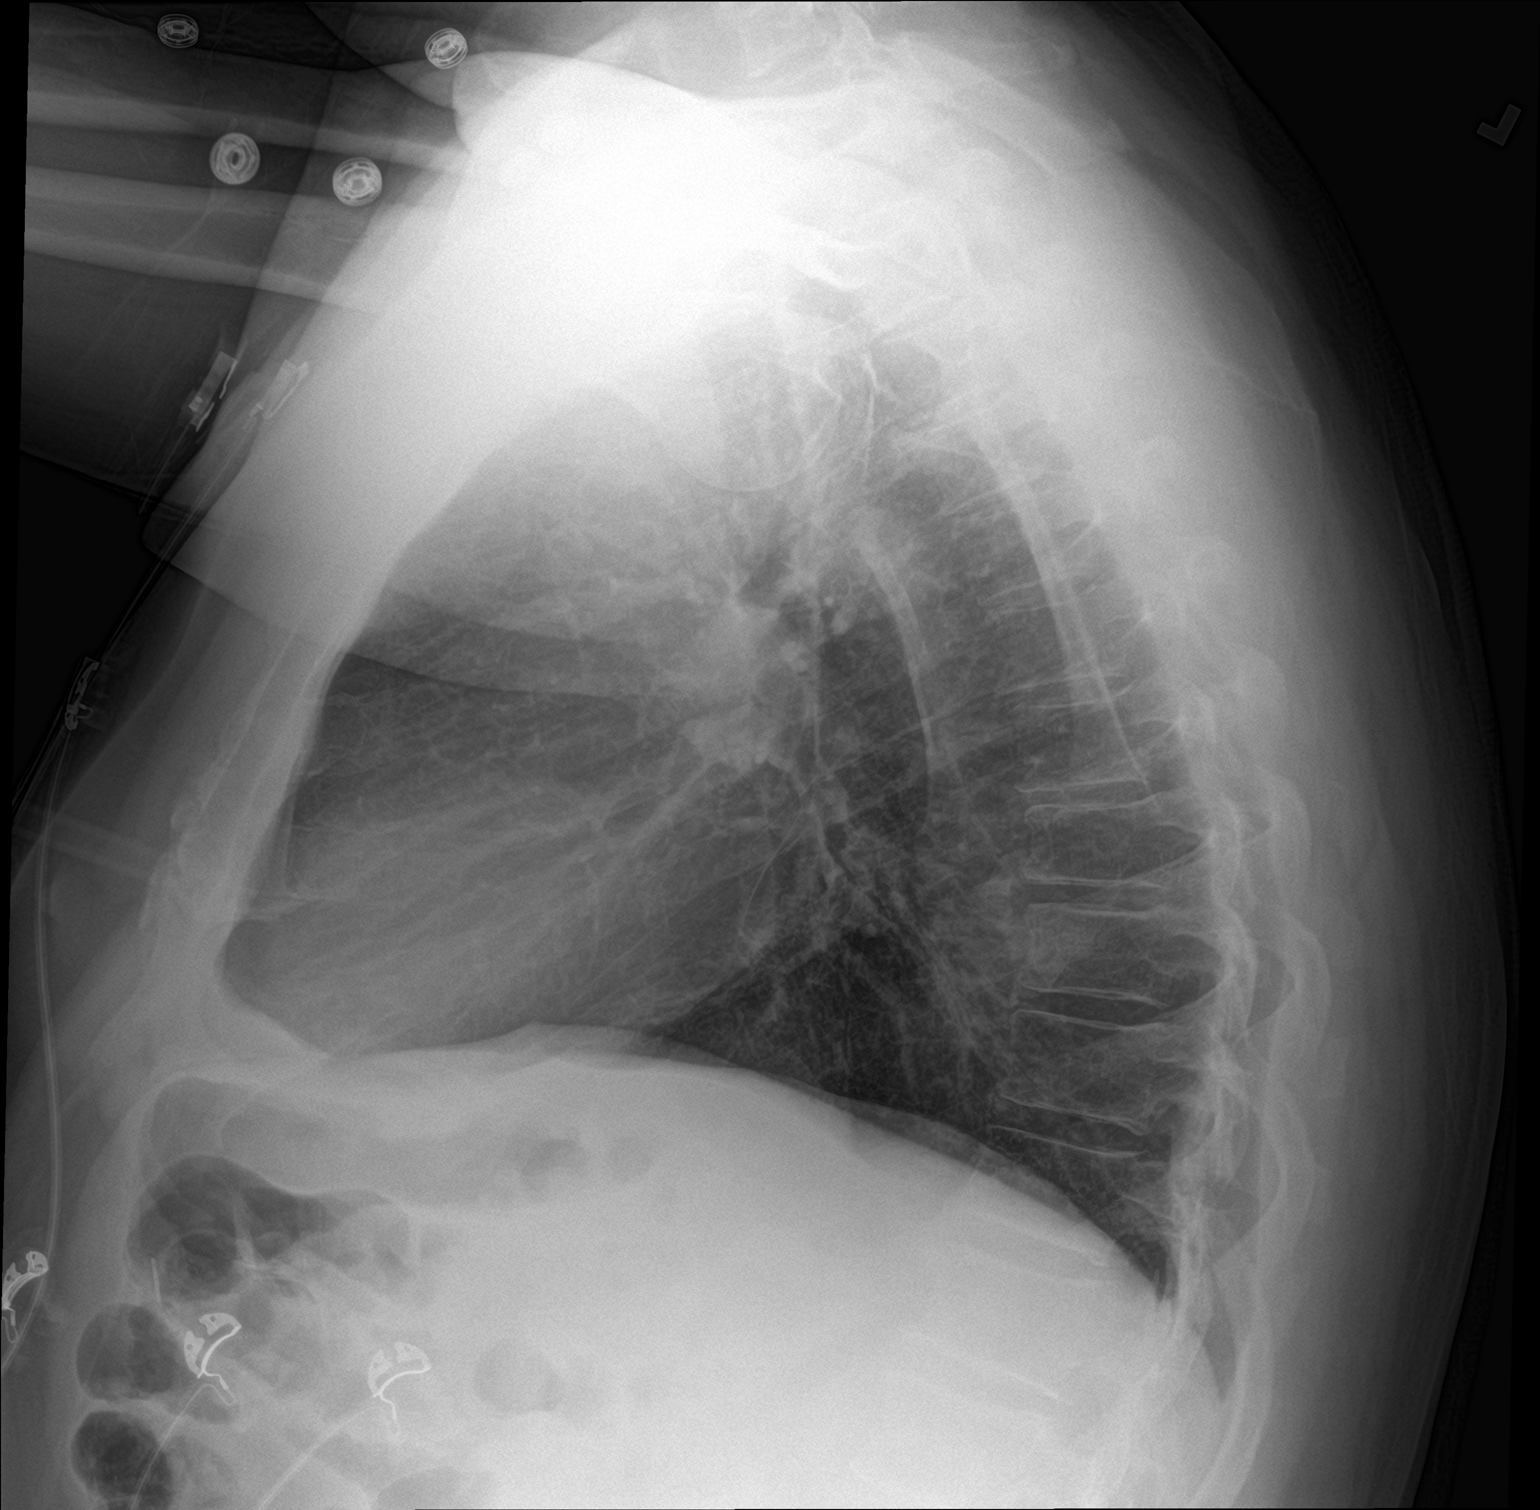

[2 of 2 positions shown; findings below may reference images not displayed]

FINDINGS: There is no edema or consolidation. The heart size and pulmonary
vascularity are normal. No adenopathy. No pneumothorax. No bone
lesions.
IMPRESSION: No edema or consolidation.

## 2019-06-16 ENCOUNTER — Other Ambulatory Visit: Payer: Self-pay | Admitting: Family Medicine

## 2019-06-16 DIAGNOSIS — Z0000 Encounter for general adult medical examination without abnormal findings: Secondary | ICD-10-CM

## 2019-06-16 DIAGNOSIS — Z131 Encounter for screening for diabetes mellitus: Secondary | ICD-10-CM

## 2019-06-16 DIAGNOSIS — M1000 Idiopathic gout, unspecified site: Secondary | ICD-10-CM

## 2019-06-18 ENCOUNTER — Telehealth: Payer: Self-pay

## 2019-06-18 NOTE — Telephone Encounter (Signed)
Left detailed VM w COVID screen and back door lab info   

## 2019-06-20 ENCOUNTER — Other Ambulatory Visit: Payer: 59

## 2019-06-23 ENCOUNTER — Other Ambulatory Visit: Payer: 59

## 2019-06-25 ENCOUNTER — Encounter: Payer: 59 | Admitting: Family Medicine

## 2019-06-25 DIAGNOSIS — Z0289 Encounter for other administrative examinations: Secondary | ICD-10-CM

## 2019-06-26 NOTE — Progress Notes (Signed)
This encounter was created in error - please disregard.

## 2019-08-15 ENCOUNTER — Other Ambulatory Visit: Payer: Self-pay | Admitting: Family Medicine

## 2019-08-17 ENCOUNTER — Emergency Department: Payer: 59

## 2019-08-17 ENCOUNTER — Emergency Department
Admission: EM | Admit: 2019-08-17 | Discharge: 2019-08-18 | Disposition: A | Discharge status: Home / Self Care | Payer: 59 | Attending: Emergency Medicine | Admitting: Emergency Medicine

## 2019-08-17 ENCOUNTER — Other Ambulatory Visit: Payer: Self-pay

## 2019-08-17 ENCOUNTER — Encounter: Payer: Self-pay | Admitting: Radiology

## 2019-08-17 DIAGNOSIS — R1012 Left upper quadrant pain: Secondary | ICD-10-CM | POA: Diagnosis not present

## 2019-08-17 DIAGNOSIS — I10 Essential (primary) hypertension: Secondary | ICD-10-CM | POA: Insufficient documentation

## 2019-08-17 DIAGNOSIS — Z79899 Other long term (current) drug therapy: Secondary | ICD-10-CM | POA: Diagnosis not present

## 2019-08-17 DIAGNOSIS — F17220 Nicotine dependence, chewing tobacco, uncomplicated: Secondary | ICD-10-CM | POA: Diagnosis not present

## 2019-08-17 LAB — COMPREHENSIVE METABOLIC PANEL
ALT: 30 U/L (ref 0–44)
AST: 26 U/L (ref 15–41)
Albumin: 4.5 g/dL (ref 3.5–5.0)
Alkaline Phosphatase: 77 U/L (ref 38–126)
Anion gap: 11 (ref 5–15)
BUN: 18 mg/dL (ref 6–20)
CO2: 21 mmol/L — ABNORMAL LOW (ref 22–32)
Calcium: 9.4 mg/dL (ref 8.9–10.3)
Chloride: 106 mmol/L (ref 98–111)
Creatinine, Ser: 0.9 mg/dL (ref 0.61–1.24)
GFR calc Af Amer: >60 mL/min (ref >60)
GFR calc non Af Amer: >60 mL/min (ref >60)
Glucose, Bld: 195 mg/dL — ABNORMAL HIGH (ref 70–99)
Potassium: 3.9 mmol/L (ref 3.5–5.1)
Sodium: 138 mmol/L (ref 135–145)
Total Bilirubin: 0.6 mg/dL (ref 0.3–1.2)
Total Protein: 7.7 g/dL (ref 6.5–8.1)

## 2019-08-17 LAB — CBC
HCT: 43.8 % (ref 39.0–52.0)
Hemoglobin: 15.2 g/dL (ref 13.0–17.0)
MCH: 32.1 pg (ref 26.0–34.0)
MCHC: 34.7 g/dL (ref 30.0–36.0)
MCV: 92.4 fL (ref 80.0–100.0)
Platelets: 229 10*3/uL (ref 150–400)
RBC: 4.74 MIL/uL (ref 4.22–5.81)
RDW: 12.3 % (ref 11.5–15.5)
WBC: 9.2 10*3/uL (ref 4.0–10.5)
nRBC: 0 % (ref 0.0–0.2)

## 2019-08-17 LAB — LIPASE, BLOOD: Lipase: 38 U/L (ref 11–51)

## 2019-08-17 MED ORDER — IOHEXOL 9 MG/ML PO SOLN
500.0000 mL | Freq: Two times a day (BID) | ORAL | Status: DC | PRN
  Administered 2019-08-17 (×2): 500 mL via ORAL
  Filled 2019-08-17 (×2): qty 500

## 2019-08-17 MED ORDER — IOHEXOL 300 MG/ML  SOLN
125.0000 mL | Freq: Once | INTRAMUSCULAR | Status: AC | PRN
  Administered 2019-08-17: 23:00:00 150 mL via INTRAVENOUS

## 2019-08-17 NOTE — ED Notes (Signed)
Call bell light answered; pt reports finished with contrast, Santiago Glad, CT called

## 2019-08-17 NOTE — ED Notes (Signed)
Assumed care of patient.

## 2019-08-17 NOTE — ED Provider Notes (Signed)
[facility] Emergency Department Provider Note   ____________________________________________    I have reviewed the triage vital signs and the nursing notes.   HISTORY  Chief Complaint Abdominal pain    HPI Randy Combs is a 53 y.o. male who presents with complaints of left-sided abdominal pain primarily the left upper quadrant.  He reports it is been sore for about a week but has become markedly worse over the last 2 to 3 days.  He describes it as a sharp pain which is much worse when he lies on his left side.  He reports he had a hiatal hernia surgery in 2016 where they used some sort of mesh on the left side of his inner abdomen.  He denies fevers or chills.  No nausea or vomiting.  Normal stools.  Has not taken anything for this.  Past Medical History:  Diagnosis Date  . Alcohol abuse, in remission    quit in 2008  . Arthritis   . Bleeding ulcer   . Blood transfusion without reported diagnosis   . Chronic pain of multiple joints   . Glaucoma   . Gout of multiple sites 03/01/2015  . Hiatal hernia   . Hiatal hernia   . Hypertension   . Tobacco abuse, in remission    Quit 2010--Dipped tin/day x 25 years  . Torn meniscus     Patient Active Problem List   Diagnosis Date Noted  . Finger swelling 10/14/2017  . Pain of foot 10/14/2017  . Chronic pain of multiple joints 02/20/2017  . Hiatal hernia 03/09/2016  . Anemia, iron deficiency 03/01/2015  . Gout of multiple sites 03/01/2015  . Essential hypertension 03/04/2014    Past Surgical History:  Procedure Laterality Date  . APPENDECTOMY  1970's  . HERNIA REPAIR     abdominal    Prior to Admission medications   Medication Sig Start Date End Date Taking? Authorizing Provider  indomethacin (INDOCIN) 50 MG capsule TAKE 1 CAPSULE BY MOUTH THREE TIMES A DAY AS NEEDED 03/17/19   Copland, Frederico Hamman, MD     Allergies Aspirin  Family History  Adopted: Yes  Problem Relation Age of Onset   . Lung cancer Mother   . Healthy Father   . Healthy Child   . Healthy Child   . Colon cancer Neg Hx   . Esophageal cancer Neg Hx     Social History Social History   Tobacco Use  . Smoking status: Former Smoker    Years: 20.00    Types: Cigars    Quit date: 02/07/2013    Years since quitting: 6.5  . Smokeless tobacco: Current User    Types: Snuff  Substance Use Topics  . Alcohol use: No    Comment: Quit in 2008  . Drug use: No    Review of Systems  Constitutional: No fever/chills Eyes: No visual changes.  ENT: No sore throat. Cardiovascular: Denies chest pain. Respiratory: Denies shortness of breath. Gastrointestinal: As above Genitourinary: Negative for dysuria. Musculoskeletal: Negative for back pain. Skin: Negative for rash. Neurological: Negative for headaches    ____________________________________________   PHYSICAL EXAM:  VITAL SIGNS: ED Triage Vitals  Enc Vitals Group     BP 08/17/19 2121 (!) 172/112     Pulse Rate 08/17/19 2121 81     Resp 08/17/19 2121 17     Temp 08/17/19 2121 98.2 F (36.8 C)     Temp Source 08/17/19 2121 Oral     SpO2  08/17/19 2121 96 %     Weight 08/17/19 2119 120.2 kg (265 lb)     Height 08/17/19 2119 1.778 m (5\' 10" )     Head Circumference --      Peak Flow --      Pain Score 08/17/19 2119 5     Pain Loc --      Pain Edu? --      Excl. in [place]? --     Constitutional: Alert and oriented.  Eyes: Conjunctivae are normal.  . Nose: No congestion/rhinnorhea. Mouth/Throat: Mucous membranes are moist.    Cardiovascular: Normal rate, regular rhythm. Grossly normal heart sounds.  Good peripheral circulation. Respiratory: Normal respiratory effort.  No retractions. Lungs CTAB. Gastrointestinal: Patient with moderate tenderness in the left upper quadrant. No distention.  No CVA tenderness.  Musculoskeletal: Warm and well perfused Neurologic:  Normal speech and language. No gross focal neurologic deficits are appreciated.   Skin:  Skin is warm, dry and intact. No rash noted. Psychiatric: Mood and affect are normal. Speech and behavior are normal.  ____________________________________________   LABS (all labs ordered are listed, but only abnormal results are displayed)  Labs Reviewed  COMPREHENSIVE METABOLIC PANEL - Abnormal; Notable for the following components:      Result Value   CO2 21 (*)    Glucose, Bld 195 (*)    All other components within normal limits  LIPASE, BLOOD  CBC  URINALYSIS, COMPLETE (UACMP) WITH MICROSCOPIC   ____________________________________________  EKG  None ____________________________________________  RADIOLOGY  CT scan pending ____________________________________________   PROCEDURES  Procedure(s) performed: No  Procedures   Critical Care performed:No ____________________________________________   INITIAL IMPRESSION / ASSESSMENT AND PLAN / ED COURSE  Pertinent labs & imaging results that were available during my care of the patient were reviewed by me and considered in my medical decision making (see chart for details).  Patient presents with left-sided abdominal pain as described above, differential includes diverticulitis, ureterolithiasis, inflammation/infected mesh.  Patient is comfortable when he is lying on his back.  We will obtain CT abdomen pelvis, lab work is unremarkable.    ____________________________________________   FINAL CLINICAL IMPRESSION(S) / ED DIAGNOSES  Final diagnoses:  Left upper quadrant pain        Note:  This document was prepared using Dragon voice recognition software and may include unintentional dictation errors.   Lavonia Drafts, MD 08/17/19 2244

## 2019-08-17 NOTE — ED Provider Notes (Signed)
-----------------------------------------   11:04 PM on 08/17/2019 -----------------------------------------  Assuming care from Dr. Corky Downs.  In short, Randy Combs is a 53 y.o. male with a chief complaint of LUQ abdominal pain.  Refer to the original H&P for additional details.  The current plan of care is to review CT scan report and reassess.    ----------------------------------------- 12:49 AM on 08/18/2019 -----------------------------------------  CT scan does not demonstrate any acute or emergent abnormalities or explanation for his left upper quadrant pain.  I reassessed the patient and he is sitting up on the edge of the bed watching TV.  He says that he still has some pain but in general he feels better.  He was reassured that there were no acute abnormalities on CT scan and he thinks that he mostly just pulled some muscles while working out in the yard.  He will follow-up as needed as an outpatient and I gave my usual and customary return precautions.   Hinda Kehr, MD 08/18/19 [phone]

## 2019-08-17 NOTE — ED Triage Notes (Signed)
Patient reports left lower abdominal pain for several day.  Reports heavy lifting.  States had hernia repair 3-4 years ago.

## 2019-08-18 NOTE — Discharge Instructions (Signed)
Your workup in the Emergency Department today was reassuring.  We did not find any specific abnormalities.  We recommend you drink plenty of fluids, take your regular medications and/or any new ones prescribed today, and follow up with the doctor(s) listed in these documents as recommended.  Return to the Emergency Department if you develop new or worsening symptoms that concern you.  

## 2019-09-12 ENCOUNTER — Other Ambulatory Visit: Payer: Self-pay | Admitting: Family Medicine

## 2019-09-17 ENCOUNTER — Other Ambulatory Visit: Payer: Self-pay | Admitting: Family Medicine

## 2019-09-23 ENCOUNTER — Other Ambulatory Visit: Payer: Self-pay | Admitting: Family Medicine

## 2019-09-23 NOTE — Telephone Encounter (Signed)
Randy Combs,  Please schedule CPE with fasting labs prior.  He has not been seen since 10/17/2017.  Then I will refill his meds.

## 2019-09-23 NOTE — Telephone Encounter (Signed)
Pt said pharmacy told him to call the provider so he can get his prescription refilled.   [place] [place] [phone]

## 2019-09-24 MED ORDER — INDOMETHACIN 50 MG PO CAPS: ORAL_CAPSULE | ORAL | 0 refills | Status: DC

## 2019-09-24 NOTE — Telephone Encounter (Signed)
Patient scheduled CPE with labs prior

## 2019-09-24 NOTE — Telephone Encounter (Signed)
Left message asking pt to call office  °

## 2019-09-24 NOTE — Telephone Encounter (Signed)
Refill for indomethacin sent to [facility] [place].

## 2019-10-13 ENCOUNTER — Telehealth: Payer: Self-pay

## 2019-10-13 NOTE — Telephone Encounter (Signed)
LVM w COVID screen, front door and back lab info 11.9.2020 TLJ

## 2019-10-16 ENCOUNTER — Other Ambulatory Visit: Payer: Self-pay

## 2019-10-16 ENCOUNTER — Other Ambulatory Visit (INDEPENDENT_AMBULATORY_CARE_PROVIDER_SITE_OTHER): Payer: 59

## 2019-10-16 DIAGNOSIS — M1000 Idiopathic gout, unspecified site: Secondary | ICD-10-CM | POA: Diagnosis not present

## 2019-10-16 DIAGNOSIS — Z0000 Encounter for general adult medical examination without abnormal findings: Secondary | ICD-10-CM

## 2019-10-16 DIAGNOSIS — Z131 Encounter for screening for diabetes mellitus: Secondary | ICD-10-CM

## 2019-10-16 LAB — HEPATIC FUNCTION PANEL
ALT: 23 U/L (ref 0–53)
AST: 19 U/L (ref 0–37)
Albumin: 4.5 g/dL (ref 3.5–5.2)
Alkaline Phosphatase: 80 U/L (ref 39–117)
Bilirubin, Direct: 0.1 mg/dL (ref 0.0–0.3)
Total Bilirubin: 0.5 mg/dL (ref 0.2–1.2)
Total Protein: 7.4 g/dL (ref 6.0–8.3)

## 2019-10-16 LAB — BASIC METABOLIC PANEL
BUN: 25 mg/dL — ABNORMAL HIGH (ref 6–23)
CO2: 29 mEq/L (ref 19–32)
Calcium: 9.6 mg/dL (ref 8.4–10.5)
Chloride: 103 mEq/L (ref 96–112)
Creatinine, Ser: 1.15 mg/dL (ref 0.40–1.50)
GFR: 66.63 mL/min (ref >60.00)
Glucose, Bld: 129 mg/dL — ABNORMAL HIGH (ref 70–99)
Potassium: 4.6 mEq/L (ref 3.5–5.1)
Sodium: 139 mEq/L (ref 135–145)

## 2019-10-16 LAB — CBC WITH DIFFERENTIAL/PLATELET
Basophils Absolute: 0 10*3/uL (ref 0.0–0.1)
Basophils Relative: 1 % (ref 0.0–3.0)
Eosinophils Absolute: 0.2 10*3/uL (ref 0.0–0.7)
Eosinophils Relative: 4.2 % (ref 0.0–5.0)
HCT: 43 % (ref 39.0–52.0)
Hemoglobin: 14.8 g/dL (ref 13.0–17.0)
Lymphocytes Relative: 31.2 % (ref 12.0–46.0)
Lymphs Abs: 1.6 10*3/uL (ref 0.7–4.0)
MCHC: 34.4 g/dL (ref 30.0–36.0)
MCV: 95.7 fl (ref 78.0–100.0)
Monocytes Absolute: 0.4 10*3/uL (ref 0.1–1.0)
Monocytes Relative: 9 % (ref 3.0–12.0)
Neutro Abs: 2.7 10*3/uL (ref 1.4–7.7)
Neutrophils Relative %: 54.6 % (ref 43.0–77.0)
Platelets: 219 10*3/uL (ref 150.0–400.0)
RBC: 4.5 Mil/uL (ref 4.22–5.81)
RDW: 13 % (ref 11.5–15.5)
WBC: 5 10*3/uL (ref 4.0–10.5)

## 2019-10-16 LAB — HEMOGLOBIN A1C: Hgb A1c MFr Bld: 6 % (ref 4.6–6.5)

## 2019-10-16 LAB — LIPID PANEL
Cholesterol: 221 mg/dL — ABNORMAL HIGH (ref 0–200)
HDL: 40.6 mg/dL (ref >39.00)
NonHDL: 180.8
Total CHOL/HDL Ratio: 5
Triglycerides: 219 mg/dL — ABNORMAL HIGH (ref 0.0–149.0)
VLDL: 43.8 mg/dL — ABNORMAL HIGH (ref 0.0–40.0)

## 2019-10-16 LAB — URIC ACID: Uric Acid, Serum: 9.4 mg/dL — ABNORMAL HIGH (ref 4.0–7.8)

## 2019-10-16 LAB — LDL CHOLESTEROL, DIRECT: Direct LDL: 158 mg/dL

## 2019-10-17 LAB — PSA, TOTAL WITH REFLEX TO PSA, FREE: PSA, Total: 0.8 ng/mL (ref <=4.0)

## 2019-10-22 ENCOUNTER — Other Ambulatory Visit: Payer: Self-pay | Admitting: Family Medicine

## 2019-10-23 ENCOUNTER — Encounter: Payer: Self-pay | Admitting: Family Medicine

## 2019-10-23 ENCOUNTER — Other Ambulatory Visit: Payer: Self-pay

## 2019-10-23 ENCOUNTER — Ambulatory Visit (INDEPENDENT_AMBULATORY_CARE_PROVIDER_SITE_OTHER): Payer: 59 | Admitting: Family Medicine

## 2019-10-23 VITALS — BP 142/92 | HR 84 | Temp 98.3°F | Ht 69.0 in | Wt 275.5 lb

## 2019-10-23 DIAGNOSIS — Z0000 Encounter for general adult medical examination without abnormal findings: Secondary | ICD-10-CM | POA: Diagnosis not present

## 2019-10-23 DIAGNOSIS — M1A00X0 Idiopathic chronic gout, unspecified site, without tophus (tophi): Secondary | ICD-10-CM | POA: Diagnosis not present

## 2019-10-23 DIAGNOSIS — Z23 Encounter for immunization: Secondary | ICD-10-CM

## 2019-10-23 MED ORDER — AMLODIPINE BESYLATE 5 MG PO TABS: 5.0000 mg | ORAL_TABLET | Freq: Every day | ORAL | 3 refills | Status: DC

## 2019-10-23 MED ORDER — FEBUXOSTAT 40 MG PO TABS: 40.0000 mg | ORAL_TABLET | Freq: Every day | ORAL | 2 refills | Status: DC

## 2019-10-23 NOTE — Progress Notes (Signed)
Randy Kruger T. Lawrie Tunks, MD Primary Care and [place] at [facility] [place], [address] Phone: [phone]   FAX: [phone]  Randy Combs - 53 y.o. male   MRN [id number]   Date of Birth: Sep 29, 1966  Visit Date: 10/23/2019   PCP: Owens Loffler, MD   Referred by: Owens Loffler, MD  Chief Complaint  Patient presents with   Annual Exam   Patient Care Team: Owens Loffler, MD as PCP - General Lollie Sails, MD as Consulting Physician (Gastroenterology) Bary Castilla Forest Gleason, MD (General Surgery) Subjective:   Randy Combs is a 53 y.o. pleasant patient who presents with the following:  Preventative Health Maintenance Visit:  Health Maintenance Summary Reviewed and updated, unless pt declines services.  Tobacco History Reviewed. Dip, 4 tins a week Alcohol: No concerns, no excessive use - no drinking Exercise Habits: Some activity, rec at least 30 mins 5 times a week STD concerns: no risk or activity to increase risk Drug Use: None Encouraged self-testicular check  Randy Combs is a really nice guy, and he comes in for general health care.  Currently his weight is at 275 pounds with a BMI of 41.  He also has a blood pressure of 142/92.  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  He is getting a job at the city, and they are again to be monitoring his blood pressure.  Historically he has had some very difficult gout has been very very hard to treat.  He has failed virtually all medications for gout.  He also had a large-scale GI bleed 1 time after taking indomethacin for a long time. He is now taking indomethacin twice a day, and his gout is doing the best that said over some time.  In the past he is failed both Uloric and allopurinol.  He has had a difficult time taking them, because he was symptomatic at those times and really has been symptomatic  for most of the last few years.  Got dehydrated   DOT, city   Gout feels good. - start proph today  Health Maintenance  Topic Date Due   HIV Screening  03/12/1982   TETANUS/TDAP  07/04/2022   COLONOSCOPY  12/31/2023   INFLUENZA VACCINE  Completed   Immunization History  Administered Date(s) Administered   Influenza,inj,Quad PF,6+ Mos 10/12/2017, 10/23/2019   Td 12/04/2005   Tdap 07/04/2012   Patient Active Problem List   Diagnosis Date Noted   Chronic pain of multiple joints 02/20/2017   Hiatal hernia 03/09/2016   Anemia, iron deficiency 03/01/2015   Gout of multiple sites 03/01/2015   Essential hypertension 03/04/2014   Past Medical History:  Diagnosis Date   Alcohol abuse, in remission    quit in 2008   Arthritis    Bleeding ulcer    Blood transfusion without reported diagnosis    Chronic pain of multiple joints    Glaucoma    Gout of multiple sites 03/01/2015   Hiatal hernia    Hiatal hernia    Hypertension    Tobacco abuse, in remission    Quit 2010--Dipped tin/day x 25 years   Torn meniscus    Past Surgical History:  Procedure Laterality Date   APPENDECTOMY  1970's   HERNIA REPAIR     abdominal   Social History   Socioeconomic History   Marital status: Married  Spouse name: Not on file   Number of children: Not on file   Years of education: Not on file   Highest education level: Not on file  Occupational History   Not on file  Social Needs   Financial resource strain: Not on file   Food insecurity    Worry: Not on file    Inability: Not on file   Transportation needs    Medical: Not on file    Non-medical: Not on file  Tobacco Use   Smoking status: Former Smoker    Years: 20.00    Types: Cigars    Quit date: 02/07/2013    Years since quitting: 6.7   Smokeless tobacco: Current User    Types: Snuff  Substance and Sexual Activity   Alcohol use: No    Comment: Quit in 2008   Drug use: No   Sexual  activity: Not Currently  Lifestyle   Physical activity    Days per week: Not on file    Minutes per session: Not on file   Stress: Not on file  Relationships   Social connections    Talks on phone: Not on file    Gets together: Not on file    Attends religious service: Not on file    Active member of club or organization: Not on file    Attends meetings of clubs or organizations: Not on file    Relationship status: Not on file   Intimate partner violence    Fear of current or ex partner: Not on file    Emotionally abused: Not on file    Physically abused: Not on file    Forced sexual activity: Not on file  Other Topics Concern   Not on file  Social History Narrative   Grading work (runs equipment)      Married      Regular exercise      Quit alcohol;tobacco      No drugs   Family History  Adopted: Yes  Problem Relation Age of Onset   Lung cancer Mother    Healthy Father    Healthy Child    Healthy Child    Colon cancer Neg Hx    Esophageal cancer Neg Hx    Allergies  Allergen Reactions   Aspirin Other (See Comments)    Ulcer    Medication list has been reviewed and updated.   General: Denies fever, chills, sweats. No significant weight loss. Eyes: Denies blurring,significant itching ENT: Denies earache, sore throat, and hoarseness. Cardiovascular: Denies chest pains, palpitations, dyspnea on exertion Respiratory: Denies cough, dyspnea at rest,wheeezing Breast: no concerns about lumps GI: Denies nausea, vomiting, diarrhea, constipation, change in bowel habits, abdominal pain, melena, hematochezia GU: Denies penile discharge, ED, urinary flow / outflow problems. No STD concerns. Musculoskeletal: Denies back pain, joint pain Derm: Denies rash, itching Neuro: Denies  paresthesias, frequent falls, frequent headaches Psych: Denies depression, anxiety Endocrine: Denies cold intolerance, heat intolerance, polydipsia Heme: Denies enlarged lymph  nodes Allergy: No hayfever  Objective:   BP (!) 142/92    Pulse 84    Temp 98.3 F (36.8 C) (Temporal)    Ht 5\' 9"  (1.753 m)    Wt 275 lb 8 oz (125 kg)    SpO2 96%    BMI 40.68 kg/m  Ideal Body Weight: Weight in (lb) to have BMI = 25: 168.9  Ideal Body Weight: Weight in (lb) to have BMI = 25: 168.9 No exam data present Depression screen  PHQ 2/9 10/23/2019  Decreased Interest 0  Down, Depressed, Hopeless 0  PHQ - 2 Score 0     GEN: well developed, well nourished, no acute distress Eyes: conjunctiva and lids normal, PERRLA, EOMI ENT: TM clear, nares clear, oral exam WNL Neck: supple, no lymphadenopathy, no thyromegaly, no JVD Pulm: clear to auscultation and percussion, respiratory effort normal CV: regular rate and rhythm, S1-S2, no murmur, rub or gallop, no bruits, peripheral pulses normal and symmetric, no cyanosis, clubbing, edema or varicosities GI: soft, non-tender; no hepatosplenomegaly, masses; active bowel sounds all quadrants GU: no hernia, testicular mass, penile discharge Lymph: no cervical, axillary or inguinal adenopathy MSK: gait normal, muscle tone and strength WNL, no joint swelling, effusions, discoloration, crepitus  SKIN: clear, good turgor, color WNL, no rashes, lesions, or ulcerations Neuro: normal mental status, normal strength, sensation, and motion Psych: alert; oriented to person, place and time, normally interactive and not anxious or depressed in appearance.  All labs reviewed with patient. Results for orders placed or performed in visit on 10/16/19  Uric acid  Result Value Ref Range   Uric Acid, Serum 9.4 (H) 4.0 - 7.8 mg/dL  PSA, Total with Reflex to PSA, Free  Result Value Ref Range   PSA, Total 0.8 < OR = 4.0 ng/mL  Hemoglobin A1c  Result Value Ref Range   Hgb A1c MFr Bld 6.0 4.6 - 6.5 %  Basic metabolic panel  Result Value Ref Range   Sodium 139 135 - 145 mEq/L   Potassium 4.6 3.5 - 5.1 mEq/L   Chloride 103 96 - 112 mEq/L   CO2 29 19 - 32  mEq/L   Glucose, Bld 129 (H) 70 - 99 mg/dL   BUN 25 (H) 6 - 23 mg/dL   Creatinine, Ser 1.15 0.40 - 1.50 mg/dL   GFR 66.63 >60.00 mL/min   Calcium 9.6 8.4 - 10.5 mg/dL  Hepatic function panel  Result Value Ref Range   Total Bilirubin 0.5 0.2 - 1.2 mg/dL   Bilirubin, Direct 0.1 0.0 - 0.3 mg/dL   Alkaline Phosphatase 80 39 - 117 U/L   AST 19 0 - 37 U/L   ALT 23 0 - 53 U/L   Total Protein 7.4 6.0 - 8.3 g/dL   Albumin 4.5 3.5 - 5.2 g/dL  CBC with Differential/Platelet  Result Value Ref Range   WBC 5.0 4.0 - 10.5 K/uL   RBC 4.50 4.22 - 5.81 Mil/uL   Hemoglobin 14.8 13.0 - 17.0 g/dL   HCT 43.0 39.0 - 52.0 %   MCV 95.7 78.0 - 100.0 fl   MCHC 34.4 30.0 - 36.0 g/dL   RDW 13.0 11.5 - 15.5 %   Platelets 219.0 150.0 - 400.0 K/uL   Neutrophils Relative % 54.6 43.0 - 77.0 %   Lymphocytes Relative 31.2 12.0 - 46.0 %   Monocytes Relative 9.0 3.0 - 12.0 %   Eosinophils Relative 4.2 0.0 - 5.0 %   Basophils Relative 1.0 0.0 - 3.0 %   Neutro Abs 2.7 1.4 - 7.7 K/uL   Lymphs Abs 1.6 0.7 - 4.0 K/uL   Monocytes Absolute 0.4 0.1 - 1.0 K/uL   Eosinophils Absolute 0.2 0.0 - 0.7 K/uL   Basophils Absolute 0.0 0.0 - 0.1 K/uL  Lipid panel  Result Value Ref Range   Cholesterol 221 (H) 0 - 200 mg/dL   Triglycerides 219.0 (H) 0.0 - 149.0 mg/dL   HDL 40.60 >39.00 mg/dL   VLDL 43.8 (H) 0.0 - 40.0 mg/dL  Total CHOL/HDL Ratio 5    NonHDL 180.80   LDL cholesterol, direct  Result Value Ref Range   Direct LDL 158.0 mg/dL    Assessment and Plan:     ICD-10-CM   1. Healthcare maintenance  Z00.00   2. Need for influenza vaccination  Z23 Flu Vaccine QUAD 6+ mos PF IM (Fluarix Quad PF)  3. Idiopathic chronic gout without tophus, unspecified site  M1A.[id number]    He does have some prediabetes and weight gain.  Is also his cholesterol is somewhat high.  I encouraged him to lose as much weight as he possibly can.  Severe gout, currently controlled.  Failure of allopurinol.  I Minna place him on Uloric 40 mg, and  if he tolerates this then increase it to 80 mg.  He literally has the worst gout I have ever seen. Lab Results  Component Value Date   LABURIC 9.4 (H) 10/16/2019    Health Maintenance Exam: The patient's preventative maintenance and recommended screening tests for an annual wellness exam were reviewed in full today. Brought up to date unless services declined.  Counselled on the importance of diet, exercise, and its role in overall health and mortality. The patient's FH and SH was reviewed, including their home life, tobacco status, and drug and alcohol status.  Follow-up in 1 year for physical exam or additional follow-up below.  Follow-up: No follow-ups on file. Or follow-up in 1 year if not noted.  Meds ordered this encounter  Medications   febuxostat (ULORIC) 40 MG tablet    Sig: Take 1 tablet (40 mg total) by mouth daily.    Dispense:  30 tablet    Refill:  2   amLODipine (NORVASC) 5 MG tablet    Sig: Take 1 tablet (5 mg total) by mouth daily.    Dispense:  90 tablet    Refill:  3   There are no discontinued medications. Orders Placed This Encounter  Procedures   Flu Vaccine QUAD 6+ mos PF IM (Fluarix Quad PF)    Signed,  Luken Shadowens T. Nellene Courtois, MD   Allergies as of 10/23/2019      Reactions   Aspirin Other (See Comments)   Ulcer      Medication List       Accurate as of October 23, 2019 11:59 PM. If you have any questions, ask your nurse or doctor.        amLODipine 5 MG tablet Commonly known as: NORVASC Take 1 tablet (5 mg total) by mouth daily. Started by: Owens Loffler, MD   febuxostat 40 MG tablet Commonly known as: ULORIC Take 1 tablet (40 mg total) by mouth daily. Started by: Owens Loffler, MD   indomethacin 50 MG capsule Commonly known as: INDOCIN TAKE 1 CAPSULE BY MOUTH THREE TIMES A DAY AS NEEDED

## 2019-10-23 NOTE — Patient Instructions (Signed)
Call me in 1 month to tell me about your gout on Urologic and we will increase the dose then    Low-Purine Eating Plan A low-purine eating plan involves making food choices to limit your intake of purine. Purine is a kind of uric acid. Too much uric acid in your blood can cause certain conditions, such as gout and kidney stones. Eating a low-purine diet can help control these conditions. What are tips for following this plan? Reading food labels   Avoid foods with saturated or Trans fat.  Check the ingredient list of grains-based foods, such as bread and cereal, to make sure that they contain whole grains.  Check the ingredient list of sauces or soups to make sure they do not contain meat or fish.  When choosing soft drinks, check the ingredient list to make sure they do not contain high-fructose corn syrup. Shopping  Buy plenty of fresh fruits and vegetables.  Avoid buying canned or fresh fish.  Buy dairy products labeled as low-fat or nonfat.  Avoid buying premade or processed foods. These foods are often high in fat, salt (sodium), and added sugar. Cooking  Use olive oil instead of butter when cooking. Oils like olive oil, canola oil, and sunflower oil contain healthy fats. Meal planning  Learn which foods do or do not affect you. If you find out that a food tends to cause your gout symptoms to flare up, avoid eating that food. You can enjoy foods that do not cause problems. If you have any questions about a food item, talk with your dietitian or health care provider.  Limit foods high in fat, especially saturated fat. Fat makes it harder for your body to get rid of uric acid.  Choose foods that are lower in fat and are lean sources of protein. General guidelines  Limit alcohol intake to no more than 1 drink a day for nonpregnant women and 2 drinks a day for men. One drink equals 12 oz of beer, 5 oz of wine, or 1 oz of hard liquor. Alcohol can affect the way your body gets  rid of uric acid.  Drink plenty of water to keep your urine clear or pale yellow. Fluids can help remove uric acid from your body.  If directed by your health care provider, take a vitamin C supplement.  Work with your health care provider and dietitian to develop a plan to achieve or maintain a healthy weight. Losing weight can help reduce uric acid in your blood. What foods are recommended? The items listed may not be a complete list. Talk with your dietitian about what dietary choices are best for you. Foods low in purines Foods low in purines do not need to be limited. These include:  All fruits.  All low-purine vegetables, pickles, and olives.  Breads, pasta, rice, cornbread, and popcorn. Cake and other baked goods.  All dairy foods.  Eggs, nuts, and nut butters.  Spices and condiments, such as salt, herbs, and vinegar.  Plant oils, butter, and margarine.  Water, sugar-free soft drinks, tea, coffee, and cocoa.  Vegetable-based soups, broths, sauces, and gravies. Foods moderate in purines Foods moderate in purines should be limited to the amounts listed.   cup of asparagus, cauliflower, spinach, mushrooms, or green peas, each day.  2/3 cup uncooked oatmeal, each day.   cup dry wheat bran or wheat germ, each day.  2-3 ounces of meat or poultry, each day.  4-6 ounces of shellfish, such as crab, lobster, oysters,  or shrimp, each day.  1 cup cooked beans, peas, or lentils, each day.  Soup, broths, or bouillon made from meat or fish. Limit these foods as much as possible. What foods are not recommended? The items listed may not be a complete list. Talk with your dietitian about what dietary choices are best for you. Limit your intake of foods high in purines, including:  Beer and other alcohol.  Meat-based gravy or sauce.  Canned or fresh fish, such as: ? Anchovies, sardines, herring, and tuna. ? Mussels and scallops. ? Codfish, trout, and haddock.  Berniece Salines.   Organ meats, such as: ? Liver or kidney. ? Tripe. ? Sweetbreads (thymus gland or pancreas).  Wild Clinical biochemist.  Yeast or yeast extract supplements.  Drinks sweetened with high-fructose corn syrup. Summary  Eating a low-purine diet can help control conditions caused by too much uric acid in the body, such as gout or kidney stones.  Choose low-purine foods, limit alcohol, and limit foods high in fat.  You will learn over time which foods do or do not affect you. If you find out that a food tends to cause your gout symptoms to flare up, avoid eating that food. This information is not intended to replace advice given to you by your health care provider. Make sure you discuss any questions you have with your health care provider. Document Released: 03/17/2011 Document Revised: 11/02/2017 Document Reviewed: 01/03/2017 Elsevier Patient Education  2020 [employer].

## 2019-11-18 ENCOUNTER — Other Ambulatory Visit: Payer: Self-pay | Admitting: Family Medicine

## 2020-07-06 ENCOUNTER — Ambulatory Visit: Payer: 59 | Admitting: Orthopaedic Surgery

## 2020-07-07 ENCOUNTER — Ambulatory Visit: Payer: 59 | Admitting: Orthopaedic Surgery

## 2020-07-14 ENCOUNTER — Ambulatory Visit: Payer: 59 | Admitting: Family Medicine

## 2020-07-26 ENCOUNTER — Ambulatory Visit: Payer: 59 | Admitting: Orthopaedic Surgery

## 2020-07-26 ENCOUNTER — Ambulatory Visit: Payer: Self-pay

## 2020-07-26 DIAGNOSIS — M25562 Pain in left knee: Secondary | ICD-10-CM

## 2020-07-26 DIAGNOSIS — M1712 Unilateral primary osteoarthritis, left knee: Secondary | ICD-10-CM | POA: Diagnosis not present

## 2020-07-26 MED ORDER — METHYLPREDNISOLONE ACETATE 40 MG/ML IJ SUSP
40.0000 mg | INTRAMUSCULAR | Status: AC | PRN
  Administered 2020-07-26: 40 mg via INTRA_ARTICULAR

## 2020-07-26 MED ORDER — LIDOCAINE HCL 1 % IJ SOLN
3.0000 mL | INTRAMUSCULAR | Status: AC | PRN
  Administered 2020-07-26: 3 mL

## 2020-07-26 NOTE — Progress Notes (Signed)
Office Visit Note   Patient: Randy Combs           Date of Birth: Apr 27, 1966           MRN: [id number] Visit Date: 07/26/2020              Requested by: Owens Loffler, MD [place],  [place] [id number] PCP: Owens Loffler, MD   Assessment & Plan: Visit Diagnoses:  1. Left knee pain, unspecified chronicity   2. Unilateral primary osteoarthritis, left knee     Plan: In order to calm down the acute pain in his left knee, I did recommend a steroid injection of the left knee today and he tolerated well.  I did try to aspirate fluid from the knee but did not really get any fluid from the knee.  At some point he does need a knee replacement, but, given the fact that he needs to wait until he has been in his job for enough time to have all for that type of surgery, he would like for [place] to try anything to temporize the pain.  The steroid injection was definitely helpful today and is going work on quad strengthening exercises and activity modification.  He is a perfect candidate for hyaluronic acid to try for his left knee as well and we will order this for him.  All questions and concerns were answered and addressed.  Follow-Up Instructions: Return in about 4 weeks (around 08/23/2020).   Orders:  Orders Placed This Encounter  Procedures  . Large Joint Inj  . XR Knee 1-2 Views Left   No orders of the defined types were placed in this encounter.     Procedures: Large Joint Inj: L knee on 07/26/2020 7:21 PM Indications: diagnostic evaluation and pain Details: 22 G 1.5 in needle, superolateral approach  Arthrogram: No  Medications: 3 mL lidocaine 1 %; 40 mg methylPREDNISolone acetate 40 MG/ML Outcome: tolerated well, no immediate complications Procedure, treatment alternatives, risks and benefits explained, specific risks discussed. Consent was given by the patient. Immediately prior to procedure a time out was called to verify the correct patient, procedure,  equipment, support staff and site/side marked as required. Patient was prepped and draped in the usual sterile fashion.       Clinical Data: No additional findings.   Subjective: Chief Complaint  Patient presents with  . Left Knee - Pain  The patient is well-known to me.  He comes in with chief complaint of left knee pain.  We actually MRI that left knee remotely many years ago and it did show a medial meniscal tear with extrusion of the meniscus and significant arthritis in the knee.  He then had an acute issue with his right knee and we performed arthroscopic intervention of the right knee and the right knee is done well in the left knee did calm down in terms of pain.  He comes in today with a 3-week history of worsening left knee pain.  He says just lying in bed and moving his knee causes significant amount of sharp pain in that knee.  He has been having more sharp pains on the medial aspect of his knee with walking and going up and down stairs.  He is up and down climbing and equipment all day long and that does cause pain.  He is also had some left knee swelling.  He has never had surgery on the left knee.  HPI  Review of Systems  He currently denies any headache, chest pain, shortness of breath, fever, chills, nausea, vomiting  Objective: Vital Signs: There were no vitals taken for this visit.  Physical Exam He is alert and orient x3 and in no acute distress Ortho Exam Examination of his left knee does show varus malalignment that is correctable.  He has significant medial joint line tenderness and crepitation of the patellofemoral joint.  The knee is ligamentously stable. Specialty Comments:  No specialty comments available.  Imaging: XR Knee 1-2 Views Left  Result Date: 07/26/2020 2 views of the left knee show tricompartment arthritic changes with significant medial joint space narrowing, varus malalignment and periarticular osteophytes in all 3 compartments.    PMFS  History: Patient Active Problem List   Diagnosis Date Noted  . Unilateral primary osteoarthritis, left knee 07/26/2020  . Chronic pain of multiple joints 02/20/2017  . Hiatal hernia 03/09/2016  . Anemia, iron deficiency 03/01/2015  . Gout of multiple sites 03/01/2015  . Essential hypertension 03/04/2014   Past Medical History:  Diagnosis Date  . Alcohol abuse, in remission    quit in 2008  . Arthritis   . Bleeding ulcer   . Blood transfusion without reported diagnosis   . Chronic pain of multiple joints   . Glaucoma   . Gout of multiple sites 03/01/2015  . Hiatal hernia   . Hiatal hernia   . Hypertension   . Tobacco abuse, in remission    Quit 2010--Dipped tin/day x 25 years  . Torn meniscus     Family History  Adopted: Yes  Problem Relation Age of Onset  . Lung cancer Mother   . Healthy Father   . Healthy Child   . Healthy Child   . Colon cancer Neg Hx   . Esophageal cancer Neg Hx     Past Surgical History:  Procedure Laterality Date  . APPENDECTOMY  1970's  . HERNIA REPAIR     abdominal   Social History   Occupational History  . Not on file  Tobacco Use  . Smoking status: Former Smoker    Years: 20.00    Types: Cigars    Quit date: 02/07/2013    Years since quitting: 7.4  . Smokeless tobacco: Current User    Types: Snuff  Substance and Sexual Activity  . Alcohol use: No    Comment: Quit in 2008  . Drug use: No  . Sexual activity: Not Currently

## 2020-07-28 ENCOUNTER — Telehealth: Payer: Self-pay

## 2020-07-28 NOTE — Telephone Encounter (Signed)
Noted  

## 2020-07-28 NOTE — Telephone Encounter (Signed)
Error

## 2020-07-28 NOTE — Telephone Encounter (Signed)
Left knee gel injection ?

## 2020-07-30 ENCOUNTER — Telehealth: Payer: Self-pay

## 2020-07-30 NOTE — Telephone Encounter (Signed)
Submitted VOB, Durolane, left knee.  

## 2020-08-05 ENCOUNTER — Ambulatory Visit: Payer: 59 | Admitting: Family Medicine

## 2020-08-05 ENCOUNTER — Other Ambulatory Visit: Payer: Self-pay

## 2020-08-05 VITALS — BP 146/90 | HR 86 | Temp 98.3°F | Ht 69.0 in | Wt 286.0 lb

## 2020-08-05 DIAGNOSIS — R7309 Other abnormal glucose: Secondary | ICD-10-CM | POA: Diagnosis not present

## 2020-08-05 DIAGNOSIS — R7303 Prediabetes: Secondary | ICD-10-CM | POA: Diagnosis not present

## 2020-08-05 DIAGNOSIS — I10 Essential (primary) hypertension: Secondary | ICD-10-CM | POA: Diagnosis not present

## 2020-08-05 LAB — POCT GLYCOSYLATED HEMOGLOBIN (HGB A1C): Hemoglobin A1C: 6.3 % — AB (ref 4.0–5.6)

## 2020-08-05 MED ORDER — AMLODIPINE BESYLATE 10 MG PO TABS: 5.0000 mg | ORAL_TABLET | Freq: Every day | ORAL | 11 refills | Status: DC

## 2020-08-05 NOTE — Patient Instructions (Addendum)
Increase exercise, work on low carb diet, and weight loss. Stop bananas!PIck a lower sugar fruit with peel and not so much! Limit carbohydrates.  Increase amlodipine to 10 mg daily. Follow BP at home  < 140/90...bring in measurements.

## 2020-08-05 NOTE — Progress Notes (Signed)
Chief Complaint  Patient presents with  . Results    C/o elevated BP and BS at [facility] DOT appt.     History of Present Illness: HPI   54 year old male patient of Dr. Lillie Fragmin with history of HTN presents following DOT physical.  He was noted to have elevated BP (158/102) and elevate glucose fasting 134.  BP Readings from Last 3 Encounters:  08/05/20 (!) 146/90  10/23/19 (!) 142/92  08/18/19 (!) 167/88  Hypertension:  Inadequate control on  Amlodipine 5 mg daily. Using medication without problems or lightheadedness:  none Chest pain with exertion:none Edema:none Short of breath:none Average home BPs: at home 138/101 Other issues:  Elevated glucose, fasting .. previously in prediabetes range. Saw glucose in urine.    He has been eating 4 bananas a day!!!!! Has gained 20 lbs in last year. He is less active. Wt Readings from Last 3 Encounters:  08/05/20 286 lb (129.7 kg)  10/23/19 275 lb 8 oz (125 kg)  08/17/19 265 lb (120.2 kg)   A1C 6.3    Lab Results  Component Value Date   HGBA1C 6.0 10/16/2019     This visit occurred during the SARS-CoV-2 public health emergency.  Safety protocols were in place, including screening questions prior to the visit, additional usage of staff PPE, and extensive cleaning of exam room while observing appropriate contact time as indicated for disinfecting solutions.   COVID 19 screen:  No recent travel or known exposure to COVID19 The patient denies respiratory symptoms of COVID 19 at this time. The importance of social distancing was discussed today.     Review of Systems  Constitutional: Negative for chills and fever.  HENT: Negative for congestion and ear pain.   Eyes: Negative for pain and redness.  Respiratory: Negative for cough and shortness of breath.   Cardiovascular: Negative for chest pain, palpitations and leg swelling.  Gastrointestinal: Negative for abdominal pain, blood in stool, constipation, diarrhea, nausea and  vomiting.  Genitourinary: Negative for dysuria.  Musculoskeletal: Negative for falls and myalgias.  Skin: Negative for rash.  Neurological: Negative for dizziness.  Psychiatric/Behavioral: Negative for depression. The patient is not nervous/anxious.       Past Medical History:  Diagnosis Date  . Alcohol abuse, in remission    quit in 2008  . Arthritis   . Bleeding ulcer   . Blood transfusion without reported diagnosis   . Chronic pain of multiple joints   . Glaucoma   . Gout of multiple sites 03/01/2015  . Hiatal hernia   . Hiatal hernia   . Hypertension   . Tobacco abuse, in remission    Quit 2010--Dipped tin/day x 25 years  . Torn meniscus     reports that he quit smoking about 7 years ago. His smoking use included cigars. He quit after 20.00 years of use. His smokeless tobacco use includes snuff. He reports that he does not drink alcohol and does not use drugs.   Current Outpatient Medications:  .  amLODipine (NORVASC) 5 MG tablet, Take 1 tablet (5 mg total) by mouth daily., Disp: 90 tablet, Rfl: 3 .  indomethacin (INDOCIN) 50 MG capsule, TAKE 1 CAPSULE BY MOUTH THREE TIMES A DAY AS NEEDED, Disp: 90 capsule, Rfl: 5   Observations/Objective: Blood pressure (!) 146/90, pulse 86, temperature 98.3 F (36.8 C), temperature source Temporal, height 5\' 9"  (1.753 m), weight 286 lb (129.7 kg), SpO2 95 %.  Physical Exam Constitutional:      Appearance: He  is well-developed. He is obese.  HENT:     Head: Normocephalic.     Right Ear: Hearing normal.     Left Ear: Hearing normal.     Nose: Nose normal.  Neck:     Thyroid: No thyroid mass or thyromegaly.     Vascular: No carotid bruit.     Trachea: Trachea normal.  Cardiovascular:     Rate and Rhythm: Normal rate and regular rhythm.     Pulses: Normal pulses.     Heart sounds: Heart sounds not distant. No murmur heard.  No friction rub. No gallop.      Comments: No peripheral edema Pulmonary:     Effort: Pulmonary effort is  normal. No respiratory distress.     Breath sounds: Normal breath sounds.  Skin:    General: Skin is warm and dry.     Findings: No rash.  Psychiatric:        Speech: Speech normal.        Behavior: Behavior normal.        Thought Content: Thought content normal.      Assessment and Plan Prediabetes  Work on low carbohydrate diet, increase exercise and work o weight loss.  Essential hypertension Inadequate control. Increase amlodipine to 10 mg daily. Follow BP at home  < 140/90...bring in measurements. Close follow up with PCP.  Elevated glucose Not isolated.  A1C in prediabetes range.       Eliezer Lofts, MD

## 2020-08-17 ENCOUNTER — Telehealth: Payer: Self-pay

## 2020-08-17 NOTE — Telephone Encounter (Signed)
Approved, Durolane, left knee. Buy & Bill Must meet deductible first Patient will be responsible for 20% OOP. No Co-pay No PA required  Appt. 08/23/2020

## 2020-08-23 ENCOUNTER — Ambulatory Visit: Payer: 59 | Admitting: Orthopaedic Surgery

## 2020-08-23 ENCOUNTER — Encounter: Payer: Self-pay | Admitting: Orthopaedic Surgery

## 2020-08-23 DIAGNOSIS — M1712 Unilateral primary osteoarthritis, left knee: Secondary | ICD-10-CM

## 2020-08-23 MED ORDER — SODIUM HYALURONATE 60 MG/3ML IX PRSY
60.0000 mg | PREFILLED_SYRINGE | INTRA_ARTICULAR | Status: AC | PRN
  Administered 2020-08-23: 60 mg via INTRA_ARTICULAR

## 2020-08-23 NOTE — Progress Notes (Signed)
   Procedure Note  Patient: OWYN RAULSTON             Date of Birth: Sep 04, 1966           MRN: [id number]             Visit Date: 08/23/2020  Procedures: Visit Diagnoses:  1. Unilateral primary osteoarthritis, left knee     Large Joint Inj: L knee on 08/23/2020 4:27 PM Indications: diagnostic evaluation and pain Details: 22 G 1.5 in needle, superolateral approach  Arthrogram: No  Medications: 60 mg Sodium Hyaluronate 60 MG/3ML Outcome: tolerated well, no immediate complications Procedure, treatment alternatives, risks and benefits explained, specific risks discussed. Consent was given by the patient. Immediately prior to procedure a time out was called to verify the correct patient, procedure, equipment, support staff and site/side marked as required. Patient was prepped and draped in the usual sterile fashion.    The patient comes in today for scheduled hyaluronic acid injection in his left knee with Durolane to treat the pain from osteoarthritis.  He has tried and failed conservative treat measures including trying a steroid which did not help much.  He does have known osteoarthritis of that knee.  Today there is no swelling with the knee.  I did place dural [place] in the left knee without difficulty.  All question concerns were answered and addressed.  If this works we can always do this again in 6 months to a year.  If not, then another treatment modality would be a knee replacement to consider.  He said he would let us know.

## 2020-09-01 ENCOUNTER — Telehealth: Payer: Self-pay

## 2020-09-01 MED ORDER — AMLODIPINE BESYLATE 10 MG PO TABS: 10.0000 mg | ORAL_TABLET | Freq: Every day | ORAL | 0 refills | Status: DC

## 2020-09-01 NOTE — Telephone Encounter (Signed)
Updated prescription for amlodipine 10 mg to take one tablet daily sent to [facility] [address].  Wilbert notified by telephone.  Follow up appointment scheduled with Dr. Lorelei Pont on 09/08/2020 at 3:40 pm.

## 2020-09-01 NOTE — Telephone Encounter (Signed)
Agreed -

## 2020-09-01 NOTE — Telephone Encounter (Signed)
Pt said when saw Dr  Diona Browner on 08/05/20 the amlodipine was increased to amlodipine 10 mg daily. Pt said he has been taking the amlodipine 10 mg daily and BP is averaging 120's/85-87. Pt said  when went to get the new rx it is for amlodipine 10 mg but instructions have take 0.5 tabs or 5 mg po daily; these are instructions on med list.. Pt wants another rx sent in with amlodipine 10 mg taking 1 tab po daily. Sending note for clarification. Pt has enough med to last another wk. [facility] [address].

## 2020-09-08 ENCOUNTER — Ambulatory Visit: Payer: 59 | Admitting: Family Medicine

## 2020-09-08 NOTE — Assessment & Plan Note (Signed)
Work on low carbohydrate diet, increase exercise and work o weight loss.

## 2020-09-08 NOTE — Assessment & Plan Note (Signed)
Inadequate control. Increase amlodipine to 10 mg daily. Follow BP at home  < 140/90...bring in measurements. Close follow up with PCP.

## 2020-09-08 NOTE — Assessment & Plan Note (Signed)
Not isolated.  A1C in prediabetes range.

## 2020-09-20 ENCOUNTER — Other Ambulatory Visit: Payer: Self-pay | Admitting: Family Medicine

## 2020-09-21 NOTE — Telephone Encounter (Signed)
Last OV 08/15/20 W/Bedsole Last fill 11/18/19  #90/5 Next OV 09/29/2020

## 2020-09-29 ENCOUNTER — Ambulatory Visit: Payer: 59 | Admitting: Family Medicine

## 2020-12-06 ENCOUNTER — Other Ambulatory Visit: Payer: Self-pay | Admitting: Family Medicine

## 2020-12-06 NOTE — Telephone Encounter (Signed)
Spoke with pt scheduled CPE and fasting labs

## 2020-12-06 NOTE — Telephone Encounter (Signed)
Please schedule CPE with fasting labs for Dr. Copland. 

## 2020-12-08 ENCOUNTER — Encounter: Payer: 59 | Admitting: Family Medicine

## 2020-12-20 ENCOUNTER — Other Ambulatory Visit: Payer: Self-pay | Admitting: Family Medicine

## 2020-12-21 ENCOUNTER — Other Ambulatory Visit: Payer: Self-pay | Admitting: Family Medicine

## 2020-12-21 ENCOUNTER — Other Ambulatory Visit: Payer: 59

## 2020-12-21 DIAGNOSIS — D509 Iron deficiency anemia, unspecified: Secondary | ICD-10-CM

## 2020-12-21 DIAGNOSIS — Z79899 Other long term (current) drug therapy: Secondary | ICD-10-CM

## 2020-12-21 DIAGNOSIS — R7303 Prediabetes: Secondary | ICD-10-CM

## 2020-12-21 DIAGNOSIS — Z13220 Encounter for screening for lipoid disorders: Secondary | ICD-10-CM

## 2020-12-21 DIAGNOSIS — M1A00X0 Idiopathic chronic gout, unspecified site, without tophus (tophi): Secondary | ICD-10-CM

## 2020-12-21 DIAGNOSIS — Z125 Encounter for screening for malignant neoplasm of prostate: Secondary | ICD-10-CM

## 2020-12-24 ENCOUNTER — Other Ambulatory Visit: Payer: 59

## 2020-12-27 ENCOUNTER — Encounter: Payer: 59 | Admitting: Family Medicine

## 2021-02-05 ENCOUNTER — Other Ambulatory Visit: Payer: Self-pay | Admitting: Family Medicine

## 2021-02-07 NOTE — Telephone Encounter (Signed)
Last office visit 08/05/2020 with Dr. Diona Browner for HTN.   Last seen by PCP 10/23/2019.  Patient no showed his CPE on 12/27/2020.  No future appointments.  Refill?

## 2021-03-01 ENCOUNTER — Other Ambulatory Visit: Payer: Self-pay | Admitting: Family Medicine

## 2021-04-12 LAB — HM DIABETES EYE EXAM

## 2021-04-19 ENCOUNTER — Telehealth: Payer: Self-pay | Admitting: Family Medicine

## 2021-04-20 NOTE — Telephone Encounter (Signed)
Pharmacy requests refill on: Indomethacin 50 mg  LAST REFILL: 09/21/2020 (Q-90, R-3) LAST OV: 10/23/2019 NEXT OV: Not Scheduled  PHARMACY: [id number] [place], [place]

## 2021-04-20 NOTE — Telephone Encounter (Signed)
Spoke with patient scheduled CPE with fasting labs 

## 2021-05-09 ENCOUNTER — Other Ambulatory Visit (INDEPENDENT_AMBULATORY_CARE_PROVIDER_SITE_OTHER): Payer: 59

## 2021-05-09 ENCOUNTER — Other Ambulatory Visit: Payer: Self-pay

## 2021-05-09 DIAGNOSIS — M1A00X0 Idiopathic chronic gout, unspecified site, without tophus (tophi): Secondary | ICD-10-CM | POA: Diagnosis not present

## 2021-05-09 DIAGNOSIS — D509 Iron deficiency anemia, unspecified: Secondary | ICD-10-CM

## 2021-05-09 DIAGNOSIS — Z13220 Encounter for screening for lipoid disorders: Secondary | ICD-10-CM

## 2021-05-09 DIAGNOSIS — R7303 Prediabetes: Secondary | ICD-10-CM

## 2021-05-09 DIAGNOSIS — Z125 Encounter for screening for malignant neoplasm of prostate: Secondary | ICD-10-CM

## 2021-05-09 DIAGNOSIS — Z79899 Other long term (current) drug therapy: Secondary | ICD-10-CM | POA: Diagnosis not present

## 2021-05-09 LAB — CBC WITH DIFFERENTIAL/PLATELET
Basophils Absolute: 0.1 10*3/uL (ref 0.0–0.1)
Basophils Relative: 1.4 % (ref 0.0–3.0)
Eosinophils Absolute: 0.2 10*3/uL (ref 0.0–0.7)
Eosinophils Relative: 3.5 % (ref 0.0–5.0)
HCT: 42.1 % (ref 39.0–52.0)
Hemoglobin: 14.7 g/dL (ref 13.0–17.0)
Lymphocytes Relative: 27 % (ref 12.0–46.0)
Lymphs Abs: 1.6 10*3/uL (ref 0.7–4.0)
MCHC: 34.9 g/dL (ref 30.0–36.0)
MCV: 95.3 fl (ref 78.0–100.0)
Monocytes Absolute: 0.4 10*3/uL (ref 0.1–1.0)
Monocytes Relative: 7.1 % (ref 3.0–12.0)
Neutro Abs: 3.7 10*3/uL (ref 1.4–7.7)
Neutrophils Relative %: 61 % (ref 43.0–77.0)
Platelets: 236 10*3/uL (ref 150.0–400.0)
RBC: 4.42 Mil/uL (ref 4.22–5.81)
RDW: 13.8 % (ref 11.5–15.5)
WBC: 6 10*3/uL (ref 4.0–10.5)

## 2021-05-09 LAB — HEPATIC FUNCTION PANEL
ALT: 25 U/L (ref 0–53)
AST: 17 U/L (ref 0–37)
Albumin: 4.3 g/dL (ref 3.5–5.2)
Alkaline Phosphatase: 93 U/L (ref 39–117)
Bilirubin, Direct: 0.1 mg/dL (ref 0.0–0.3)
Total Bilirubin: 0.7 mg/dL (ref 0.2–1.2)
Total Protein: 7 g/dL (ref 6.0–8.3)

## 2021-05-09 LAB — IBC + FERRITIN
Ferritin: 168.4 ng/mL (ref 22.0–322.0)
Iron: 174 ug/dL — ABNORMAL HIGH (ref 42–165)
Saturation Ratios: 51.6 % — ABNORMAL HIGH (ref 20.0–50.0)
Transferrin: 241 mg/dL (ref 212.0–360.0)

## 2021-05-09 LAB — BASIC METABOLIC PANEL
BUN: 18 mg/dL (ref 6–23)
CO2: 29 mEq/L (ref 19–32)
Calcium: 9.9 mg/dL (ref 8.4–10.5)
Chloride: 102 mEq/L (ref 96–112)
Creatinine, Ser: 1.12 mg/dL (ref 0.40–1.50)
GFR: 74.66 mL/min (ref >60.00)
Glucose, Bld: 189 mg/dL — ABNORMAL HIGH (ref 70–99)
Potassium: 5.1 mEq/L (ref 3.5–5.1)
Sodium: 139 mEq/L (ref 135–145)

## 2021-05-09 LAB — LIPID PANEL
Cholesterol: 210 mg/dL — ABNORMAL HIGH (ref 0–200)
HDL: 39.8 mg/dL (ref >39.00)
NonHDL: 170.62
Total CHOL/HDL Ratio: 5
Triglycerides: 256 mg/dL — ABNORMAL HIGH (ref 0.0–149.0)
VLDL: 51.2 mg/dL — ABNORMAL HIGH (ref 0.0–40.0)

## 2021-05-09 LAB — URIC ACID: Uric Acid, Serum: 8.3 mg/dL — ABNORMAL HIGH (ref 4.0–7.8)

## 2021-05-09 LAB — HEMOGLOBIN A1C: Hgb A1c MFr Bld: 9.9 % — ABNORMAL HIGH (ref 4.6–6.5)

## 2021-05-09 LAB — LDL CHOLESTEROL, DIRECT: Direct LDL: 136 mg/dL

## 2021-05-10 LAB — PSA, TOTAL WITH REFLEX TO PSA, FREE: PSA, Total: 0.6 ng/mL (ref <=4.0)

## 2021-05-18 ENCOUNTER — Encounter: Payer: 59 | Admitting: Family Medicine

## 2021-06-06 ENCOUNTER — Other Ambulatory Visit: Payer: Self-pay | Admitting: Family Medicine

## 2021-06-12 ENCOUNTER — Encounter: Payer: Self-pay | Admitting: Family Medicine

## 2021-06-12 DIAGNOSIS — IMO0002 Reserved for concepts with insufficient information to code with codable children: Secondary | ICD-10-CM

## 2021-06-12 DIAGNOSIS — E1165 Type 2 diabetes mellitus with hyperglycemia: Secondary | ICD-10-CM | POA: Insufficient documentation

## 2021-06-12 HISTORY — DX: Type 2 diabetes mellitus with hyperglycemia: E11.65

## 2021-06-12 HISTORY — DX: Reserved for concepts with insufficient information to code with codable children: IMO0002

## 2021-06-12 NOTE — Progress Notes (Deleted)
Arlicia Paquette T. Aithan Farrelly, MD, [place] at [facility] [place], [address]  Phone: [phone]  FAX: [phone]  PERL KERNEY - 55 y.o. male  MRN [id number]  Date of Birth: 1966/01/23  Date: 06/13/2021  PCP: Owens Loffler, MD  Referral: Owens Loffler, MD  No chief complaint on file.   This visit occurred during the SARS-CoV-2 public health emergency.  Safety protocols were in place, including screening questions prior to the visit, additional usage of staff PPE, and extensive cleaning of exam room while observing appropriate contact time as indicated for disinfecting solutions.   Patient Care Team: Owens Loffler, MD as PCP - General Lollie Sails, MD (Inactive) as Consulting Physician (Gastroenterology) Bary Castilla Forest Gleason, MD (General Surgery) Subjective:   NORVIN OHLIN is a 55 y.o. pleasant patient who presents with the following:  Preventative Health Maintenance Visit:  Health Maintenance Summary Reviewed and updated, unless pt declines services.  Tobacco History Reviewed. Alcohol: No concerns, no excessive use Exercise Habits: Some activity, rec at least 30 mins 5 times a week STD concerns: no risk or activity to increase risk Drug Use: None  Mateusz is here for his CPX, but he has developed significant DM 2.  Prevnar-20 Foot Eye Singrix  Diabetes Mellitus: conversion from prediabetes to high BS Exercise: minimal / intermittent Avg blood sugars at home: not checking Foot problems: none Hypoglycemia: none No nausea, vomitting, blurred vision, polyuria.  Lab Results  Component Value Date   HGBA1C 9.9 (H) 05/09/2021   HGBA1C 6.3 (A) 08/05/2020   HGBA1C 6.0 10/16/2019   Lab Results  Component Value Date   CREATININE 1.12 05/09/2021    Wt Readings from Last 3 Encounters:  08/05/20 286 lb (129.7 kg)  10/23/19 275 lb 8 oz (125 kg)  08/17/19 265 lb (120.2 kg)     Health  Maintenance  Topic Date Due   PNEUMOCOCCAL POLYSACCHARIDE VACCINE AGE 41-64 HIGH RISK  Never done   Pneumococcal Vaccine 62-22 Years old (1 - PCV) Never done   FOOT EXAM  Never done   OPHTHALMOLOGY EXAM  Never done   HIV Screening  Never done   Hepatitis C Screening  Never done   Zoster Vaccines- Shingrix (1 of 2) Never done   COVID-19 Vaccine (3 - Pfizer risk series) 11/03/2020   INFLUENZA VACCINE  07/04/2021   HEMOGLOBIN A1C  11/08/2021   TETANUS/TDAP  07/04/2022   COLONOSCOPY (Pts 45-75yrs Insurance coverage will need to be confirmed)  12/31/2023   HPV VACCINES  Aged Out   Immunization History  Administered Date(s) Administered   Influenza,inj,Quad PF,6+ Mos 10/12/2017, 10/23/2019   PFIZER(Purple Top)SARS-COV-2 Vaccination 06/22/2020, 10/06/2020   Td 12/04/2005   Tdap 07/04/2012   Patient Active Problem List   Diagnosis Date Noted   Uncontrolled type 2 diabetes mellitus, without long-term current use of insulin ([place]) 06/12/2021    Priority: High   Hiatal hernia 03/09/2016   Anemia, iron deficiency 03/01/2015   Gout of multiple sites 03/01/2015   Essential hypertension 03/04/2014    Past Medical History:  Diagnosis Date   Alcohol abuse, in remission    quit in 2008   Arthritis    Bleeding ulcer    Blood transfusion without reported diagnosis    Chronic pain of multiple joints    Glaucoma    Gout of multiple sites 03/01/2015   Hiatal hernia    Hiatal hernia    Hypertension    Tobacco abuse,  in remission    Quit 2010--Dipped tin/day x 25 years   Torn meniscus    Uncontrolled type 2 diabetes mellitus, without long-term current use of insulin ([place]) 06/12/2021    Past Surgical History:  Procedure Laterality Date   APPENDECTOMY  1970's   HERNIA REPAIR     abdominal    Family History  Adopted: Yes  Problem Relation Age of Onset   Lung cancer Mother    Healthy Father    Healthy Child    Healthy Child    Colon cancer Neg Hx    Esophageal cancer Neg Hx      Past Medical History, Surgical History, Social History, Family History, Problem List, Medications, and Allergies have been reviewed and updated if relevant.  Review of Systems: Pertinent positives are listed above.  Otherwise, a full 14 point review of systems has been done in full and it is negative except where it is noted positive.  Objective:   There were no vitals taken for this visit. Ideal Body Weight:    Ideal Body Weight:   No results found. Depression screen [facility] 2/9 10/23/2019  Decreased Interest 0  Down, Depressed, Hopeless 0  PHQ - 2 Score 0     GEN: well developed, well nourished, no acute distress Eyes: conjunctiva and lids normal, PERRLA, EOMI ENT: TM clear, nares clear, oral exam WNL Neck: supple, no lymphadenopathy, no thyromegaly, no JVD Pulm: clear to auscultation and percussion, respiratory effort normal CV: regular rate and rhythm, S1-S2, no murmur, rub or gallop, no bruits, peripheral pulses normal and symmetric, no cyanosis, clubbing, edema or varicosities GI: soft, non-tender; no hepatosplenomegaly, masses; active bowel sounds all quadrants GU: deferred Lymph: no cervical, axillary or inguinal adenopathy MSK: gait normal, muscle tone and strength WNL, no joint swelling, effusions, discoloration, crepitus  SKIN: clear, good turgor, color WNL, no rashes, lesions, or ulcerations Neuro: normal mental status, normal strength, sensation, and motion Psych: alert; oriented to person, place and time, normally interactive and not anxious or depressed in appearance.  All labs reviewed with patient. Results for orders placed or performed in visit on 05/09/21  IBC + Ferritin  Result Value Ref Range   Iron 174 (H) 42 - 165 ug/dL   Transferrin 241.0 212.0 - 360.0 mg/dL   Saturation Ratios 51.6 (H) 20.0 - 50.0 %   Ferritin 168.4 22.0 - 322.0 ng/mL  Uric acid  Result Value Ref Range   Uric Acid, Serum 8.3 (H) 4.0 - 7.8 mg/dL  PSA, Total with Reflex to PSA, Free   Result Value Ref Range   PSA, Total 0.6 < OR = 4.0 ng/mL  Hemoglobin A1c  Result Value Ref Range   Hgb A1c MFr Bld 9.9 (H) 4.6 - 6.5 %  Basic metabolic panel  Result Value Ref Range   Sodium 139 135 - 145 mEq/L   Potassium 5.1 3.5 - 5.1 mEq/L   Chloride 102 96 - 112 mEq/L   CO2 29 19 - 32 mEq/L   Glucose, Bld 189 (H) 70 - 99 mg/dL   BUN 18 6 - 23 mg/dL   Creatinine, Ser 1.12 0.40 - 1.50 mg/dL   GFR 74.66 >60.00 mL/min   Calcium 9.9 8.4 - 10.5 mg/dL  Hepatic function panel  Result Value Ref Range   Total Bilirubin 0.7 0.2 - 1.2 mg/dL   Bilirubin, Direct 0.1 0.0 - 0.3 mg/dL   Alkaline Phosphatase 93 39 - 117 U/L   AST 17 0 - 37 U/L   ALT  25 0 - 53 U/L   Total Protein 7.0 6.0 - 8.3 g/dL   Albumin 4.3 3.5 - 5.2 g/dL  CBC with Differential/Platelet  Result Value Ref Range   WBC 6.0 4.0 - 10.5 K/uL   RBC 4.42 4.22 - 5.81 Mil/uL   Hemoglobin 14.7 13.0 - 17.0 g/dL   HCT 42.1 39.0 - 52.0 %   MCV 95.3 78.0 - 100.0 fl   MCHC 34.9 30.0 - 36.0 g/dL   RDW 13.8 11.5 - 15.5 %   Platelets 236.0 150.0 - 400.0 K/uL   Neutrophils Relative % 61.0 43.0 - 77.0 %   Lymphocytes Relative 27.0 12.0 - 46.0 %   Monocytes Relative 7.1 3.0 - 12.0 %   Eosinophils Relative 3.5 0.0 - 5.0 %   Basophils Relative 1.4 0.0 - 3.0 %   Neutro Abs 3.7 1.4 - 7.7 K/uL   Lymphs Abs 1.6 0.7 - 4.0 K/uL   Monocytes Absolute 0.4 0.1 - 1.0 K/uL   Eosinophils Absolute 0.2 0.0 - 0.7 K/uL   Basophils Absolute 0.1 0.0 - 0.1 K/uL  Lipid panel  Result Value Ref Range   Cholesterol 210 (H) 0 - 200 mg/dL   Triglycerides 256.0 (H) 0.0 - 149.0 mg/dL   HDL 39.80 >39.00 mg/dL   VLDL 51.2 (H) 0.0 - 40.0 mg/dL   Total CHOL/HDL Ratio 5    NonHDL 170.62   LDL cholesterol, direct  Result Value Ref Range   Direct LDL 136.0 mg/dL    Assessment and Plan:     ICD-10-CM   1. Encounter for health maintenance examination with abnormal findings  Z00.01     2. Uncontrolled type 2 diabetes mellitus, without long-term current use  of insulin (HCC)  E11.65       Health Maintenance Exam: The patient's preventative maintenance and recommended screening tests for an annual wellness exam were reviewed in full today. Brought up to date unless services declined.  Counselled on the importance of diet, exercise, and its role in overall health and mortality. The patient's FH and SH was reviewed, including their home life, tobacco status, and drug and alcohol status.  Follow-up in 1 year for physical exam or additional follow-up below.  Follow-up: No follow-ups on file. Or follow-up in 1 year if not noted.  No orders of the defined types were placed in this encounter.  There are no discontinued medications. No orders of the defined types were placed in this encounter.   Signed,  Maud Deed. Genavive Kubicki, MD   Allergies as of 06/13/2021       Reactions   Aspirin Other (See Comments)   Ulcer        Medication List        Accurate as of June 12, 2021  4:44 PM. If you have any questions, ask your nurse or doctor.          amLODipine 10 MG tablet Commonly known as: NORVASC TAKE 1 TABLET BY MOUTH EVERY DAY   indomethacin 50 MG capsule Commonly known as: INDOCIN TAKE 1 CAPSULE BY MOUTH THREE TIMES A DAY AS NEEDED

## 2021-06-13 ENCOUNTER — Encounter: Payer: 59 | Admitting: Family Medicine

## 2021-07-03 NOTE — Progress Notes (Signed)
Randy Corniel T. Gladis Soley, MD, [place] at [facility] [place], [address]  Phone: [phone]  FAX: [phone]  Randy Combs - 55 y.o. male  MRN [id number]  Date of Birth: [birth year]  Date: 07/04/2021  PCP: Owens Loffler, MD  Referral: Owens Loffler, MD  Chief Complaint  Patient presents with   Annual Exam    This visit occurred during the SARS-CoV-2 public health emergency.  Safety protocols were in place, including screening questions prior to the visit, additional usage of staff PPE, and extensive cleaning of exam room while observing appropriate contact time as indicated for disinfecting solutions.   Patient Care Team: Owens Loffler, MD as PCP - General Lollie Sails, MD (Inactive) as Consulting Physician (Gastroenterology) Bary Castilla Forest Gleason, MD (General Surgery) Subjective:   Randy Combs is a 55 y.o. pleasant patient who presents with the following:  Preventative Health Maintenance Visit:  Health Maintenance Summary Reviewed and updated, unless pt declines services.  Tobacco History Reviewed.  Still dips.  Alcohol: No concerns, no excessive use.   Exercise Habits: Some activity, rec at least 30 mins 5 times a week STD concerns: no risk or activity to increase risk Drug Use: None  Check on gout - he is still doing quite poorly, and it is present almost all of the time.   Prevnar-20 (DM) Shingrix  He also has new onset diabetes.  He has been eating quite poorly, he eats fast food all of the time, and his hemoglobin A1c is now 10.  Prior to this, he was prediabetic. Body mass index is 38.94 kg/m.  Lab Results  Component Value Date   HGBA1C 9.9 (H) 05/09/2021    Fish  Fast food  L 1st MTP gout.   No bp meds   Health Maintenance  Topic Date Due   PNEUMOCOCCAL POLYSACCHARIDE VACCINE AGE 54-64 HIGH RISK  Never done   Pneumococcal Vaccine 54-26 Years old (1 - PCV) Never done    FOOT EXAM  Never done   URINE MICROALBUMIN  Never done   HIV Screening  Never done   Hepatitis C Screening  Never done   COVID-19 Vaccine (3 - Pfizer risk series) 11/03/2020   INFLUENZA VACCINE  07/04/2021   Zoster Vaccines- Shingrix (2 of 2) 08/29/2021   HEMOGLOBIN A1C  11/08/2021   OPHTHALMOLOGY EXAM  04/12/2022   TETANUS/TDAP  07/04/2022   COLONOSCOPY (Pts 45-41yr Insurance coverage will need to be confirmed)  12/31/2023   HPV VACCINES  Aged Out   Immunization History  Administered Date(s) Administered   Influenza,inj,Quad PF,6+ Mos 10/12/2017, 10/23/2019   PFIZER(Purple Top)SARS-COV-2 Vaccination 06/22/2020, 10/06/2020   PNEUMOCOCCAL CONJUGATE-20 07/04/2021   Td 12/04/2005   Tdap 07/04/2012   Zoster Recombinat (Shingrix) 07/04/2021   Patient Active Problem List   Diagnosis Date Noted   Uncontrolled type 2 diabetes mellitus, without long-term current use of insulin ([place] 06/12/2021    Priority: High   Dip use 07/05/2021   Hiatal hernia 03/09/2016   Anemia, iron deficiency 03/01/2015   Idiopathic chronic gout, multiple sites, without tophus (tophi) 03/01/2015   Essential hypertension 03/04/2014    Past Medical History:  Diagnosis Date   Alcohol abuse, in remission    quit in 2008   Bleeding ulcer    Blood transfusion without reported diagnosis    Glaucoma    Gout of multiple sites 03/01/2015   Hiatal hernia    Hypertension    Uncontrolled type 2  diabetes mellitus, without long-term current use of insulin ([place]) 06/12/2021    Past Surgical History:  Procedure Laterality Date   APPENDECTOMY  1970's   HERNIA REPAIR     abdominal    Family History  Adopted: Yes  Problem Relation Age of Onset   Lung cancer Mother    Healthy Father    Healthy Child    Healthy Child    Colon cancer Neg Hx    Esophageal cancer Neg Hx     Past Medical History, Surgical History, Social History, Family History, Problem List, Medications, and Allergies have been reviewed and  updated if relevant.  Review of Systems: Pertinent positives are listed above.  Otherwise, a full 14 point review of systems has been done in full and it is negative except where it is noted positive.  Objective:   BP (!) 160/106   Pulse 74   Temp 97.9 F (36.6 C) (Temporal)   Ht 5' 9.5" (1.765 m)   Wt 267 lb 8 oz (121.3 kg)   SpO2 97%   BMI 38.94 kg/m  Ideal Body Weight: Weight in (lb) to have BMI = 25: 171.4  Ideal Body Weight: Weight in (lb) to have BMI = 25: 171.4 No results found. Depression screen [facility] 2/9 07/04/2021 10/23/2019  Decreased Interest 0 0  Down, Depressed, Hopeless 0 0  PHQ - 2 Score 0 0     GEN: well developed, well nourished, no acute distress Eyes: conjunctiva and lids normal, PERRLA, EOMI ENT: TM clear, nares clear, oral exam WNL Neck: supple, no lymphadenopathy, no thyromegaly, no JVD Pulm: clear to auscultation and percussion, respiratory effort normal CV: regular rate and rhythm, S1-S2, no murmur, rub or gallop, no bruits, peripheral pulses normal and symmetric, no cyanosis, clubbing, edema or varicosities GI: soft, non-tender; no hepatosplenomegaly, masses; active bowel sounds all quadrants GU: deferred Lymph: no cervical, axillary or inguinal adenopathy MSK: L 1st MTP joint quite swollen, red, warm SKIN: clear, good turgor, color WNL, no rashes, lesions, or ulcerations Neuro: normal mental status, normal strength, sensation, and motion Psych: alert; oriented to person, place and time, normally interactive and not anxious or depressed in appearance.    All labs reviewed with patient. Results for orders placed or performed in visit on 07/04/21  HM DIABETES EYE EXAM  Result Value Ref Range   HM Diabetic Eye Exam No Retinopathy No Retinopathy    Assessment and Plan:     ICD-10-CM   1. Encounter for health maintenance examination with abnormal findings  Z00.01     2. Healthcare maintenance  Z00.00 Varicella-zoster vaccine IM (Shingrix)    3.  Uncontrolled type 2 diabetes mellitus, without long-term current use of insulin (HCC)  E11.65 metFORMIN (GLUCOPHAGE XR) 500 MG 24 hr tablet    Ambulatory referral to diabetic education    Pneumococcal conjugate vaccine 20-valent (Prevnar 20)    4. Need for shingles vaccine  Z23 Varicella-zoster vaccine IM (Shingrix)    5. Need for vaccination against Streptococcus pneumoniae  Z23 Pneumococcal conjugate vaccine 20-valent (Prevnar 20)    6. Idiopathic chronic gout, multiple sites, without tophus (tophi)  M1A.61X0      Prevnar and Shingrix vaccines today We reviewed generalized health maintenance and all blood work.  Total encounter time: 20 minutes. This includes total time spent on the day of encounter.  Over and above General health care, we spent quite a bit of time talking about new onset type 2 diabetes.  I reviewed pathophysiology, basics of diabetes.  He has been eating very poorly, and he thinks that he can tailor his diet to an appropriate level.  Also going to have him start metformin, and we will likely need to titrate up this dose given his high emergency.  Chronic gout, and he has done exceptionally poorly off and on for years.  I am going to try him on scheduled colchicine to see if this helps.  Patient Instructions  American Diabetes Association.    Start with 1 tablet of Metformin for 1 week, then increase it to 2 tablets.  This is 24 hour medication, so you can take it all together in the morning.   Do not start the Colchicine until you are already on metformin at 2 tablets a day for 2 weeks.     Health Maintenance Exam: The patient's preventative maintenance and recommended screening tests for an annual wellness exam were reviewed in full today. Brought up to date unless services declined.  Counselled on the importance of diet, exercise, and its role in overall health and mortality. The patient's FH and SH was reviewed, including their home life, tobacco status, and  drug and alcohol status.  Follow-up in 1 year for physical exam or additional follow-up below.  Follow-up: Return in about 3 months (around 10/04/2021) for diabetes follow-up. Or follow-up in 1 year if not noted.  Meds ordered this encounter  Medications   colchicine 0.6 MG tablet    Sig: Take 1 tablet (0.6 mg total) by mouth 2 (two) times daily.    Dispense:  60 tablet    Refill:  2   metFORMIN (GLUCOPHAGE XR) 500 MG 24 hr tablet    Sig: Take 2 tablets (1,000 mg total) by mouth daily with breakfast.    Dispense:  60 tablet    Refill:  5   There are no discontinued medications. Orders Placed This Encounter  Procedures   Varicella-zoster vaccine IM (Shingrix)   Pneumococcal conjugate vaccine 20-valent (Prevnar 20)   Ambulatory referral to diabetic education   HM DIABETES EYE EXAM    Signed,  Reda Citron T. Shaquaya Wuellner, MD   Allergies as of 07/04/2021       Reactions   Aspirin Other (See Comments)   Ulcer        Medication List        Accurate as of July 04, 2021 11:59 PM. If you have any questions, ask your nurse or doctor.          amLODipine 10 MG tablet Commonly known as: NORVASC TAKE 1 TABLET BY MOUTH EVERY DAY   colchicine 0.6 MG tablet Take 1 tablet (0.6 mg total) by mouth 2 (two) times daily. Started by: Owens Loffler, MD   indomethacin 50 MG capsule Commonly known as: INDOCIN TAKE 1 CAPSULE BY MOUTH THREE TIMES A DAY AS NEEDED   metFORMIN 500 MG 24 hr tablet Commonly known as: Glucophage XR Take 2 tablets (1,000 mg total) by mouth daily with breakfast. Started by: Owens Loffler, MD

## 2021-07-04 ENCOUNTER — Other Ambulatory Visit: Payer: Self-pay

## 2021-07-04 ENCOUNTER — Ambulatory Visit (INDEPENDENT_AMBULATORY_CARE_PROVIDER_SITE_OTHER): Payer: 59 | Admitting: Family Medicine

## 2021-07-04 ENCOUNTER — Encounter: Payer: Self-pay | Admitting: Family Medicine

## 2021-07-04 VITALS — BP 160/106 | HR 74 | Temp 97.9°F | Ht 69.5 in | Wt 267.5 lb

## 2021-07-04 DIAGNOSIS — Z23 Encounter for immunization: Secondary | ICD-10-CM | POA: Diagnosis not present

## 2021-07-04 DIAGNOSIS — Z0000 Encounter for general adult medical examination without abnormal findings: Secondary | ICD-10-CM | POA: Diagnosis not present

## 2021-07-04 DIAGNOSIS — M1A09X0 Idiopathic chronic gout, multiple sites, without tophus (tophi): Secondary | ICD-10-CM | POA: Diagnosis not present

## 2021-07-04 DIAGNOSIS — IMO0002 Reserved for concepts with insufficient information to code with codable children: Secondary | ICD-10-CM

## 2021-07-04 DIAGNOSIS — E1165 Type 2 diabetes mellitus with hyperglycemia: Secondary | ICD-10-CM

## 2021-07-04 DIAGNOSIS — Z0001 Encounter for general adult medical examination with abnormal findings: Secondary | ICD-10-CM

## 2021-07-04 MED ORDER — COLCHICINE 0.6 MG PO TABS: 0.6000 mg | ORAL_TABLET | Freq: Two times a day (BID) | ORAL | 2 refills | Status: DC

## 2021-07-04 MED ORDER — METFORMIN HCL ER 500 MG PO TB24: 1000.0000 mg | ORAL_TABLET | Freq: Every day | ORAL | 5 refills | Status: DC

## 2021-07-04 NOTE — Patient Instructions (Addendum)
American Diabetes Association.    Start with 1 tablet of Metformin for 1 week, then increase it to 2 tablets.  This is 24 hour medication, so you can take it all together in the morning.   Do not start the Colchicine until you are already on metformin at 2 tablets a day for 2 weeks.

## 2021-07-05 ENCOUNTER — Encounter: Payer: Self-pay | Admitting: Family Medicine

## 2021-07-05 DIAGNOSIS — Z720 Tobacco use: Secondary | ICD-10-CM | POA: Insufficient documentation

## 2021-07-07 ENCOUNTER — Other Ambulatory Visit: Payer: Self-pay | Admitting: Family Medicine

## 2021-10-05 ENCOUNTER — Ambulatory Visit: Payer: 59 | Admitting: Family Medicine

## 2021-11-25 ENCOUNTER — Other Ambulatory Visit: Payer: Self-pay | Admitting: Family Medicine

## 2021-12-12 ENCOUNTER — Other Ambulatory Visit: Payer: Self-pay

## 2021-12-12 ENCOUNTER — Emergency Department
Admission: EM | Admit: 2021-12-12 | Discharge: 2021-12-12 | Disposition: A | Discharge status: Home / Self Care | Payer: 59 | Attending: Emergency Medicine | Admitting: Emergency Medicine

## 2021-12-12 ENCOUNTER — Emergency Department: Payer: 59

## 2021-12-12 ENCOUNTER — Encounter: Payer: Self-pay | Admitting: Radiology

## 2021-12-12 DIAGNOSIS — Z720 Tobacco use: Secondary | ICD-10-CM | POA: Insufficient documentation

## 2021-12-12 DIAGNOSIS — Z20822 Contact with and (suspected) exposure to covid-19: Secondary | ICD-10-CM | POA: Diagnosis not present

## 2021-12-12 DIAGNOSIS — I10 Essential (primary) hypertension: Secondary | ICD-10-CM | POA: Diagnosis not present

## 2021-12-12 DIAGNOSIS — E119 Type 2 diabetes mellitus without complications: Secondary | ICD-10-CM | POA: Diagnosis not present

## 2021-12-12 DIAGNOSIS — R000 Tachycardia, unspecified: Secondary | ICD-10-CM | POA: Diagnosis not present

## 2021-12-12 DIAGNOSIS — R079 Chest pain, unspecified: Secondary | ICD-10-CM | POA: Insufficient documentation

## 2021-12-12 LAB — CBC
HCT: 45.3 % (ref 39.0–52.0)
Hemoglobin: 15.9 g/dL (ref 13.0–17.0)
MCH: 34.6 pg — ABNORMAL HIGH (ref 26.0–34.0)
MCHC: 35.1 g/dL (ref 30.0–36.0)
MCV: 98.5 fL (ref 80.0–100.0)
Platelets: 359 10*3/uL (ref 150–400)
RBC: 4.6 MIL/uL (ref 4.22–5.81)
RDW: 12.3 % (ref 11.5–15.5)
WBC: 10.9 10*3/uL — ABNORMAL HIGH (ref 4.0–10.5)
nRBC: 0 % (ref 0.0–0.2)

## 2021-12-12 LAB — TROPONIN I (HIGH SENSITIVITY)
Troponin I (High Sensitivity): 6 ng/L (ref <18)
Troponin I (High Sensitivity): 5 ng/L (ref <18)

## 2021-12-12 LAB — COMPREHENSIVE METABOLIC PANEL
ALT: 40 U/L (ref 0–44)
AST: 39 U/L (ref 15–41)
Albumin: 3.9 g/dL (ref 3.5–5.0)
Alkaline Phosphatase: 96 U/L (ref 38–126)
Anion gap: 8 (ref 5–15)
BUN: 16 mg/dL (ref 6–20)
CO2: 27 mmol/L (ref 22–32)
Calcium: 9.5 mg/dL (ref 8.9–10.3)
Chloride: 101 mmol/L (ref 98–111)
Creatinine, Ser: 1.37 mg/dL — ABNORMAL HIGH (ref 0.61–1.24)
GFR, Estimated: >60 mL/min (ref >60)
Glucose, Bld: 180 mg/dL — ABNORMAL HIGH (ref 70–99)
Potassium: 4.4 mmol/L (ref 3.5–5.1)
Sodium: 136 mmol/L (ref 135–145)
Total Bilirubin: 1.4 mg/dL — ABNORMAL HIGH (ref 0.3–1.2)
Total Protein: 8 g/dL (ref 6.5–8.1)

## 2021-12-12 LAB — RESP PANEL BY RT-PCR (FLU A&B, COVID) ARPGX2
Influenza A by PCR: NEGATIVE
Influenza B by PCR: NEGATIVE
SARS Coronavirus 2 by RT PCR: NEGATIVE

## 2021-12-12 NOTE — Discharge Instructions (Signed)

## 2021-12-12 NOTE — ED Provider Notes (Addendum)
[facility] Provider Note    Event Date/Time   First MD Initiated Contact with Patient 12/12/21 0119     (approximate)   History   Chest Pain   HPI  Randy Combs is a 56 y.o. male whose medical history includes hypertension, diabetes without use of insulin, and an extensive prior hiatal hernia that required multiple surgeries to correct.  He uses chewing tobacco but does not smoke cigarettes.  He has never been to a cardiologist and has no history of known heart disease.  He presents for evaluation of chest pain.  He said he has been having episodes of chest pain for the last few weeks but they have been more mild.  Today it started and was more severe with central upper chest pressure and squeezing.  No shortness of breath.  Pain has almost completely resolved now and is mild.  Seems to be worse with exertion, better with rest, although sometimes the discomfort starts at rest.  He has had a few episodes of nausea and vomiting recently as well as a couple of loose stools, but he has no abdominal pain and denies fever.  He is also not had any sore throat or nasal congestion.   Physical Exam   Triage Vital Signs: ED Triage Vitals  Enc Vitals Group     BP 12/12/21 0115 132/86     Pulse Rate 12/12/21 0115 (!) 101     Resp 12/12/21 0115 20     Temp 12/12/21 0115 98 F (36.7 C)     Temp Source 12/12/21 0115 Oral     SpO2 12/12/21 0115 93 %     Weight 12/12/21 0100 122.5 kg (270 lb)     Height 12/12/21 0100 1.778 m (5\' 10" )     Head Circumference --      Peak Flow --      Pain Score 12/12/21 0100 8     Pain Loc --      Pain Edu? --      Excl. in [place]? --     Most recent vital signs: Vitals:   12/12/21 0115  BP: 132/86  Pulse: (!) 101  Resp: 20  Temp: 98 F (36.7 C)  SpO2: 93%     General: Awake, no distress.  CV:  Good peripheral perfusion.  Resp:  Normal effort.  Abd:  No distention.  No tenderness palpation of the abdomen including  in the epigastrium and right upper quadrant. Other:  No focal neurological deficits, moving all 4 extremities, no appreciable weakness nor facial droop.   ED Results / Procedures / Treatments   Labs (all labs ordered are listed, but only abnormal results are displayed) Labs Reviewed  CBC - Abnormal; Notable for the following components:      Result Value   WBC 10.9 (*)    MCH 34.6 (*)    All other components within normal limits  COMPREHENSIVE METABOLIC PANEL - Abnormal; Notable for the following components:   Glucose, Bld 180 (*)    Creatinine, Ser 1.37 (*)    Total Bilirubin 1.4 (*)    All other components within normal limits  RESP PANEL BY RT-PCR (FLU A&B, COVID) ARPGX2  TROPONIN I (HIGH SENSITIVITY)  TROPONIN I (HIGH SENSITIVITY)     EKG  ED ECG REPORT I, Hinda Kehr, the attending physician, personally viewed and interpreted this ECG.  Date: 12/12/2021 EKG Time: 01:03 Rate: 101 Rhythm: sinus tachycardia QRS Axis: LAD Intervals: RBBB ST/T Wave  abnormalities: Non-specific ST segment / T-wave changes, but no clear evidence of acute ischemia. Narrative Interpretation: no definitive evidence of acute ischemia; does not meet STEMI criteria.    RADIOLOGY I personally reviewed the patient's CXR and I see no evidence of acute issue/abnormality.  Reviewed radiology report and the radiologist also did not interpret anything abnormal on the chest x-ray.    PROCEDURES:  Critical Care performed: No  .1-3 Lead EKG Interpretation Performed by: Hinda Kehr, MD Authorized by: Hinda Kehr, MD     Interpretation: abnormal     ECG rate:  100   ECG rate assessment: tachycardic     Rhythm: sinus tachycardia     Ectopy: none     Conduction: normal     MEDICATIONS ORDERED IN ED: Medications - No data to display   IMPRESSION / MDM / Media / ED COURSE  I reviewed the triage vital signs and the nursing notes.                               Differential diagnosis includes, but is not limited to, angina, ACS including unstable angina, pneumonia, pneumothorax, recurrent hiatal hernia or complication of prior surgery, biliary colic, pancreatitis.   The patient is on the cardiac monitor to evaluate for evidence of arrhythmia and/or significant heart rate changes.  Vital signs are stable other than some initial very mild tachycardia which has resolved.  No evidence of ischemia on EKG.  Labs ordered for this evaluation including high-sensitivity troponin, comprehensive metabolic panel, and CBC.  I also subsequently ordered a respiratory viral panel which was negative.  I personally reviewed the patient's CXR and I see no evidence of acute issue/abnormality.  Reviewed radiology report and the radiologist also did not interpret anything abnormal on the chest x-ray.  Initial high-sensitivity troponin is within normal limits.  Comprehensive metabolic panel is essentially normal other than a creatinine of 1.37 which is up slightly from baseline but not indicative of acute renal failure.  CBC is essentially normal as well. Lipase within normal limits.  The patient has a few risk factors including hypertension, diabetes, and use of chewing tobacco which can increase his risk of cardiac disease.  He has been having symptoms for a few weeks but is worse tonight.  However his symptoms are currently mild.  We discussed various options including admission for cardiac work-up versus repeat troponin.  The patient would prefer to get a repeat troponin and reassess.  I think that is reasonable.  Will reassess after the second troponin.  No evidence of acute infectious process.  No concern for PE at this time.  No abdominal tenderness, no acute for acute intra-abdominal pathology and no need for advanced imaging.     Clinical Course as of 12/16/21 1110  Mon Dec 12, 2021  0431 Troponin I (High Sensitivity): 5 Patient's repeat high-sensitivity  troponin is 5 which is reassuring.  He said he still feels a little bit of central chest discomfort.  I considered admission and discussed it with him and offered to bring him into the hospital for unstable angina, with or without starting heparin.  We also discussed the possibility of outpatient follow-up.  He would prefer to follow-up as an outpatient and I think that is very reasonable given the duration of symptoms and otherwise reassuring work-up including no ischemia on EKG and to high-sensitivity troponins that are within normal limits.  I will facilitate close  follow-up with a cardiologist by giving him the paperwork and sending a secure message through [facility] to the cardiologist to ask for assistance in setting up follow-up appointment.  I gave strict return precautions to the patient and his wife and they both understand and agree with the plan. [CF]    Clinical Course User Index [CF] Hinda Kehr, MD     FINAL CLINICAL IMPRESSION(S) / ED DIAGNOSES   Final diagnoses:  Chest pain, unspecified type     Rx / DC Orders   ED Discharge Orders     None        Note:  This document was prepared using Dragon voice recognition software and may include unintentional dictation errors.   Hinda Kehr, MD 12/12/21 3010    Hinda Kehr, MD 12/16/21 1110

## 2021-12-12 NOTE — ED Triage Notes (Signed)
Pt states is having central chest pain, bloody stool, diarrhea, nausea and vomiting that began yesterday. Tp states has felt shob and had a cough as well.

## 2021-12-16 ENCOUNTER — Encounter: Payer: Self-pay | Admitting: Cardiology

## 2021-12-16 ENCOUNTER — Ambulatory Visit: Payer: 59 | Admitting: Cardiology

## 2021-12-16 ENCOUNTER — Other Ambulatory Visit: Payer: Self-pay

## 2021-12-16 VITALS — BP 134/92 | HR 80 | Ht 70.0 in | Wt 270.2 lb

## 2021-12-16 DIAGNOSIS — Z8719 Personal history of other diseases of the digestive system: Secondary | ICD-10-CM | POA: Diagnosis not present

## 2021-12-16 DIAGNOSIS — R072 Precordial pain: Secondary | ICD-10-CM

## 2021-12-16 DIAGNOSIS — I10 Essential (primary) hypertension: Secondary | ICD-10-CM | POA: Diagnosis not present

## 2021-12-16 MED ORDER — IVABRADINE HCL 7.5 MG PO TABS: 15.0000 mg | ORAL_TABLET | Freq: Once | ORAL | 0 refills | Status: AC

## 2021-12-16 MED ORDER — METOPROLOL TARTRATE 100 MG PO TABS: 100.0000 mg | ORAL_TABLET | Freq: Once | ORAL | 0 refills | Status: DC

## 2021-12-16 MED ORDER — OMEPRAZOLE 20 MG PO CPDR: 20.0000 mg | DELAYED_RELEASE_CAPSULE | Freq: Every day | ORAL | 11 refills | Status: DC

## 2021-12-16 NOTE — Patient Instructions (Signed)
Medication Instructions:   Your physician has recommended you make the following change in your medication:    START taking Prilosec 20 MG once a day. You can purchase this over the counter.  *If you need a refill on your cardiac medications before your next appointment, please call your pharmacy*   Lab Work:  BMP to be drawn in office today.  If you have labs (blood work) drawn today and your tests are completely normal, you will receive your results only by: [place] (if you have MyChart) OR A paper copy in the mail If you have any lab test that is abnormal or we need to change your treatment, we will call you to review the results.   Testing/Procedures:  Your physician has requested that you have an echocardiogram. Echocardiography is a painless test that uses sound waves to create images of your heart. It provides your doctor with information about the size and shape of your heart and how well your hearts Pate and valves are working. This procedure takes approximately one hour. There are no restrictions for this procedure.  2.   Your physician has requested that you have cardiac CT. Cardiac computed tomography (CT) is a painless test that uses an x-ray machine to take clear, detailed pictures of your heart.   Your cardiac CT will be scheduled at:  [facility] [address] [place], [place] [address] [phone]    Please arrive 15 mins early for check-in and test prep.    Please follow these instructions carefully (unless otherwise directed):   On the Night Before the Test: Be sure to Drink plenty of water. Do not consume any caffeinated/decaffeinated beverages or chocolate 12 hours prior to your test.   On the Day of the Test: Drink plenty of water until 1 hour prior to the test. Do not eat any food 4 hours prior to the test. You may take your regular medications prior to the test.  Take metoprolol  (Lopressor) 100 MG two hours prior to test. Take Ivabradine (Corlanor) 15 MG two hours prior to test.       After the Test: Drink plenty of water. After receiving IV contrast, you may experience a mild flushed feeling. This is normal. On occasion, you may experience a mild rash up to 24 hours after the test. This is not dangerous. If this occurs, you can take Benadryl 25 mg and increase your fluid intake. If you experience trouble breathing, this can be serious. If it is severe call 911 IMMEDIATELY. If it is mild, please call our office. If you take any of these medications: Glipizide/Metformin, Avandament, Glucavance, please do not take 48 hours after completing test unless otherwise instructed.  Please allow 2-4 weeks for scheduling of routine cardiac CTs. Some insurance companies require a pre-authorization which may delay scheduling of this test.   For non-scheduling related questions, please contact the cardiac imaging nurse navigator should you have any questions/concerns: Marchia Bond, Cardiac Imaging Nurse Navigator Gordy Clement, Cardiac Imaging Nurse Navigator [facility] Direct Office Dial: [phone]   For scheduling needs, including cancellations and rescheduling, please call [place], [phone].       Follow-Up: At [facility], you and your health needs are our priority.  As part of our continuing mission to provide you with exceptional heart care, we have created designated Provider Care Teams.  These Care Teams include your primary Cardiologist (physician) and Advanced Practice Providers (APPs -  Physician Assistants and Nurse Practitioners) who all work together to provide you with the care you need, when you need it.  We recommend signing up for the patient portal called "MyChart".  Sign up information is provided on this After Visit Summary.  MyChart is used to connect with patients for Virtual Visits (Telemedicine).  Patients are  able to view lab/test results, encounter notes, upcoming appointments, etc.  Non-urgent messages can be sent to your provider as well.   To learn more about what you can do with MyChart, go to [email or web address].    Your next appointment:   Follow up after testing ([facility] 12/29/21)   The format for your next appointment:   In Person  Provider:   Kate Sable, MD    Other Instructions

## 2021-12-16 NOTE — Progress Notes (Signed)
Cardiology Office Note:    Date:  12/16/2021   ID:  Randy Combs, DOB 1966-05-27, MRN [id number]  PCP:  Owens Loffler, MD   [facility] Providers Cardiologist:  None     Referring MD: Owens Loffler, MD   No chief complaint on file.   History of Present Illness:    Randy Combs is a 56 y.o. male with a hx of hypertension, diabetes, hiatal hernia s/p surgical repair 2016 who presents due to chest pain.  Patient was at home 5 days ago when he felt chest pressure in the evening when he was about to go to bed.  He states feeling unwell the entire day, had some nausea and vomiting.  Rated pain as 8 out of 10, describes chest discomfort as pressure-like.  He went to the emergency room, work-up with troponins and EKG was unrevealing.  He was advised to follow-up with cardiology.  Upon discharge, he states taking some Tums with improvement in symptoms.  He has a history of hiatal hernia, underwent surgical repair in 2016.  Denies any history of reflux since his hiatal hernia surgery.  Was previously on amlodipine for BP, but he stopped taking due to not liking how it made him feel.  Denies any personal history of heart disease.  Lexiscan Myoview 2016 low risk study Echocardiogram 2016 EF 60 to 65%  Past Medical History:  Diagnosis Date   Alcohol abuse, in remission    quit in 2008   Bleeding ulcer    Blood transfusion without reported diagnosis    Glaucoma    Gout of multiple sites 03/01/2015   Hiatal hernia    Hypertension    Uncontrolled type 2 diabetes mellitus, without long-term current use of insulin 06/12/2021    Past Surgical History:  Procedure Laterality Date   APPENDECTOMY  1970's   HERNIA REPAIR     abdominal    Current Medications: Current Meds  Medication Sig   colchicine 0.6 MG tablet Take 1 tablet (0.6 mg total) by mouth 2 (two) times daily.   ivabradine (CORLANOR) 7.5 MG TABS tablet Take 2 tablets (15 mg total) by mouth once for 1 dose. Take  2 hours prior to your CT scan.   metoprolol tartrate (LOPRESSOR) 100 MG tablet Take 1 tablet (100 mg total) by mouth once for 1 dose. Take 2 hours prior to your CT scan.   omeprazole (PRILOSEC) 20 MG capsule Take 1 capsule (20 mg total) by mouth daily.     Allergies:   Aspirin   Social History   Socioeconomic History   Marital status: Married    Spouse name: Not on file   Number of children: Not on file   Years of education: Not on file   Highest education level: Not on file  Occupational History   Not on file  Tobacco Use   Smoking status: Former    Types: Cigars    Quit date: 02/07/2013    Years since quitting: 8.8   Smokeless tobacco: Current    Types: Snuff  Substance and Sexual Activity   Alcohol use: No    Comment: Quit in 2008   Drug use: No   Sexual activity: Not Currently  Other Topics Concern   Not on file  Social History Narrative   Grading work (runs equipment)      Married      Regular exercise      Quit alcohol;tobacco      No drugs   Social Determinants  of Health   Financial Resource Strain: Not on file  Food Insecurity: Not on file  Transportation Needs: Not on file  Physical Activity: Not on file  Stress: Not on file  Social Connections: Not on file     Family History: The patient's family history includes Healthy in his child, child, and father; Lung cancer in his mother. There is no history of Colon cancer or Esophageal cancer. He was adopted.  ROS:   Please see the history of present illness.     All other systems reviewed and are negative.  EKGs/Labs/Other Studies Reviewed:    The following studies were reviewed today:   EKG:  EKG is  ordered today.  The ekg ordered today demonstrates   Recent Labs: 12/12/2021: ALT 40; BUN 16; Creatinine, Ser 1.37; Hemoglobin 15.9; Platelets 359; Potassium 4.4; Sodium 136  Recent Lipid Panel    Component Value Date/Time   CHOL 210 (H) 05/09/2021 0739   TRIG 256.0 (H) 05/09/2021 0739   HDL 39.80  05/09/2021 0739   CHOLHDL 5 05/09/2021 0739   VLDL 51.2 (H) 05/09/2021 0739   LDLDIRECT 136.0 05/09/2021 0739     Risk Assessment/Calculations:          Physical Exam:    VS:  BP (!) 134/92    Pulse 80    Ht 5\' 10"  (1.778 m)    Wt 270 lb 3.2 oz (122.6 kg)    SpO2 96%    BMI 38.77 kg/m     Wt Readings from Last 3 Encounters:  12/16/21 270 lb 3.2 oz (122.6 kg)  12/12/21 270 lb (122.5 kg)  07/04/21 267 lb 8 oz (121.3 kg)     GEN:  Well nourished, well developed in no acute distress HEENT: Normal NECK: No JVD; No carotid bruits LYMPHATICS: No lymphadenopathy CARDIAC: RRR, no murmurs, rubs, gallops RESPIRATORY:  Clear to auscultation without rales, wheezing or rhonchi  ABDOMEN: Soft, non-tender, non-distended MUSCULOSKELETAL:  No edema; No deformity  SKIN: Warm and dry NEUROLOGIC:  Alert and oriented x 3 PSYCHIATRIC:  Normal affect   ASSESSMENT:    1. Precordial pain   2. Primary hypertension   3. Hx of hiatal hernia    PLAN:    In order of problems listed above:  Chest pain, risk factors hypertension, diabetes.  Get echocardiogram, get coronary CTA.  If cardiac work-up is unrevealing, consider GI etiology due to history of hiatal hernia, discomfort improving with Tums. Hypertension, BP reasonable, controlled at home.  Continue to monitor. History of hiatal hernia, start omeprazole otc 20 mg daily.  Follow-up after echo and coronary CTA      Medication Adjustments/Labs and Tests Ordered: Current medicines are reviewed at length with the patient today.  Concerns regarding medicines are outlined above.  Orders Placed This Encounter  Procedures   CT CORONARY MORPH W/CTA COR W/SCORE W/CA W/CM &/OR WO/CM   Basic metabolic panel   EKG [id number]   ECHOCARDIOGRAM COMPLETE   Meds ordered this encounter  Medications   omeprazole (PRILOSEC) 20 MG capsule    Sig: Take 1 capsule (20 mg total) by mouth daily.    Dispense:  30 capsule    Refill:  11   metoprolol tartrate  (LOPRESSOR) 100 MG tablet    Sig: Take 1 tablet (100 mg total) by mouth once for 1 dose. Take 2 hours prior to your CT scan.    Dispense:  1 tablet    Refill:  0   ivabradine (CORLANOR) 7.5 MG TABS  tablet    Sig: Take 2 tablets (15 mg total) by mouth once for 1 dose. Take 2 hours prior to your CT scan.    Dispense:  2 tablet    Refill:  0    Patient Instructions  Medication Instructions:   Your physician has recommended you make the following change in your medication:    START taking Prilosec 20 MG once a day. You can purchase this over the counter.  *If you need a refill on your cardiac medications before your next appointment, please call your pharmacy*   Lab Work:  BMP to be drawn in office today.  If you have labs (blood work) drawn today and your tests are completely normal, you will receive your results only by: [place] (if you have MyChart) OR A paper copy in the mail If you have any lab test that is abnormal or we need to change your treatment, we will call you to review the results.   Testing/Procedures:  Your physician has requested that you have an echocardiogram. Echocardiography is a painless test that uses sound waves to create images of your heart. It provides your doctor with information about the size and shape of your heart and how well your hearts Blocher and valves are working. This procedure takes approximately one hour. There are no restrictions for this procedure.  2.   Your physician has requested that you have cardiac CT. Cardiac computed tomography (CT) is a painless test that uses an x-ray machine to take clear, detailed pictures of your heart.   Your cardiac CT will be scheduled at:  [facility] [address] [place], [place] [address] [phone]    Please arrive 15 mins early for check-in and test prep.    Please follow these instructions carefully (unless otherwise  directed):   On the Night Before the Test: Be sure to Drink plenty of water. Do not consume any caffeinated/decaffeinated beverages or chocolate 12 hours prior to your test.   On the Day of the Test: Drink plenty of water until 1 hour prior to the test. Do not eat any food 4 hours prior to the test. You may take your regular medications prior to the test.  Take metoprolol (Lopressor) 100 MG two hours prior to test. Take Ivabradine (Corlanor) 15 MG two hours prior to test.       After the Test: Drink plenty of water. After receiving IV contrast, you may experience a mild flushed feeling. This is normal. On occasion, you may experience a mild rash up to 24 hours after the test. This is not dangerous. If this occurs, you can take Benadryl 25 mg and increase your fluid intake. If you experience trouble breathing, this can be serious. If it is severe call 911 IMMEDIATELY. If it is mild, please call our office. If you take any of these medications: Glipizide/Metformin, Avandament, Glucavance, please do not take 48 hours after completing test unless otherwise instructed.  Please allow 2-4 weeks for scheduling of routine cardiac CTs. Some insurance companies require a pre-authorization which may delay scheduling of this test.   For non-scheduling related questions, please contact the cardiac imaging nurse navigator should you have any questions/concerns: Marchia Bond, Cardiac Imaging Nurse Navigator Gordy Clement, Cardiac Imaging Nurse Navigator [facility] Direct Office Dial: [phone]   For scheduling needs, including cancellations and rescheduling, please call [place], [phone].       Follow-Up: At [facility], you  and your health needs are our priority.  As part of our continuing mission to provide you with exceptional heart care, we have created designated Provider Care Teams.  These Care Teams include your primary Cardiologist (physician) and  Advanced Practice Providers (APPs -  Physician Assistants and Nurse Practitioners) who all work together to provide you with the care you need, when you need it.  We recommend signing up for the patient portal called "MyChart".  Sign up information is provided on this After Visit Summary.  MyChart is used to connect with patients for Virtual Visits (Telemedicine).  Patients are able to view lab/test results, encounter notes, upcoming appointments, etc.  Non-urgent messages can be sent to your provider as well.   To learn more about what you can do with MyChart, go to [email or web address].    Your next appointment:   Follow up after testing ([facility] 12/29/21)   The format for your next appointment:   In Person  Provider:   Kate Sable, MD    Other Instructions     Signed, Kate Sable, MD  12/16/2021 5:20 PM    [place]

## 2021-12-17 LAB — BASIC METABOLIC PANEL
BUN/Creatinine Ratio: 24 — ABNORMAL HIGH (ref 9–20)
BUN: 27 mg/dL — ABNORMAL HIGH (ref 6–24)
CO2: 20 mmol/L (ref 20–29)
Calcium: 9.8 mg/dL (ref 8.7–10.2)
Chloride: 107 mmol/L — ABNORMAL HIGH (ref 96–106)
Creatinine, Ser: 1.11 mg/dL (ref 0.76–1.27)
Glucose: 105 mg/dL — ABNORMAL HIGH (ref 70–99)
Potassium: 4.5 mmol/L (ref 3.5–5.2)
Sodium: 143 mmol/L (ref 134–144)
eGFR: 79 mL/min/{1.73_m2} (ref >59)

## 2021-12-27 ENCOUNTER — Telehealth (HOSPITAL_COMMUNITY): Payer: Self-pay | Admitting: Emergency Medicine

## 2021-12-27 NOTE — Telephone Encounter (Signed)
Reaching out to patient to offer assistance regarding upcoming cardiac imaging study; pt verbalizes understanding of appt date/time, parking situation and where to check in, pre-test NPO status and medications ordered, and verified current allergies; name and call back number provided for further questions should they arise Randy Qu RN Navigator Cardiac Imaging [facility] [phone] office [phone] cell 

## 2021-12-29 ENCOUNTER — Other Ambulatory Visit: Payer: Self-pay | Admitting: Cardiology

## 2021-12-29 ENCOUNTER — Ambulatory Visit
Admission: RE | Admit: 2021-12-29 | Discharge: 2021-12-29 | Disposition: A | Discharge status: Home / Self Care | Payer: 59 | Source: Ambulatory Visit | Attending: Cardiology | Admitting: Cardiology

## 2021-12-29 ENCOUNTER — Other Ambulatory Visit: Payer: Self-pay

## 2021-12-29 DIAGNOSIS — R072 Precordial pain: Secondary | ICD-10-CM | POA: Insufficient documentation

## 2021-12-29 DIAGNOSIS — R931 Abnormal findings on diagnostic imaging of heart and coronary circulation: Secondary | ICD-10-CM | POA: Diagnosis present

## 2021-12-29 DIAGNOSIS — I2510 Atherosclerotic heart disease of native coronary artery without angina pectoris: Secondary | ICD-10-CM

## 2021-12-29 MED ORDER — NITROGLYCERIN 0.4 MG SL SUBL
0.8000 mg | SUBLINGUAL_TABLET | Freq: Once | SUBLINGUAL | Status: AC
  Administered 2021-12-29: 0.8 mg via SUBLINGUAL

## 2021-12-29 MED ORDER — IOHEXOL 350 MG/ML SOLN
100.0000 mL | Freq: Once | INTRAVENOUS | Status: AC | PRN
  Administered 2021-12-29: 100 mL via INTRAVENOUS

## 2021-12-29 NOTE — Progress Notes (Signed)
Patient tolerated CT well. Drank water after. Vital signs stable encourage to drink water throughout day.Reasons explained and verbalized understanding. Ambulated steady gait.  

## 2021-12-30 DIAGNOSIS — R931 Abnormal findings on diagnostic imaging of heart and coronary circulation: Secondary | ICD-10-CM

## 2022-01-11 ENCOUNTER — Other Ambulatory Visit: Payer: 59

## 2022-01-26 ENCOUNTER — Other Ambulatory Visit: Payer: 59

## 2022-01-30 ENCOUNTER — Ambulatory Visit: Payer: 59 | Admitting: Cardiology

## 2022-01-31 ENCOUNTER — Encounter: Payer: Self-pay | Admitting: Cardiology

## 2022-03-13 ENCOUNTER — Encounter: Payer: Self-pay | Admitting: Cardiology

## 2022-03-13 NOTE — Progress Notes (Signed)
Unable to contact to schedule echocardiogram, letter sent. Order cancelled.

## 2022-03-29 ENCOUNTER — Encounter: Payer: Self-pay | Admitting: Family Medicine

## 2022-03-29 ENCOUNTER — Ambulatory Visit: Payer: 59 | Admitting: Family Medicine

## 2022-03-29 VITALS — BP 138/80 | HR 105 | Temp 98.2°F | Ht 70.0 in | Wt 278.2 lb

## 2022-03-29 DIAGNOSIS — E1165 Type 2 diabetes mellitus with hyperglycemia: Secondary | ICD-10-CM

## 2022-03-29 DIAGNOSIS — M791 Myalgia, unspecified site: Secondary | ICD-10-CM | POA: Diagnosis not present

## 2022-03-29 DIAGNOSIS — K625 Hemorrhage of anus and rectum: Secondary | ICD-10-CM | POA: Diagnosis not present

## 2022-03-29 DIAGNOSIS — M2550 Pain in unspecified joint: Secondary | ICD-10-CM | POA: Diagnosis not present

## 2022-03-29 DIAGNOSIS — M1A09X0 Idiopathic chronic gout, multiple sites, without tophus (tophi): Secondary | ICD-10-CM | POA: Diagnosis not present

## 2022-03-29 NOTE — Progress Notes (Signed)
? ? ?Cicilia Clinger T. Hazael Olveda, MD, [facility] ?Therapist, music at [facility] ?[place] ?[place], [address] ? ?Phone: [phone]  FAX: [phone] ? ?LEEAM CEDRONE - 56 y.o. male  MRN [id number]  Date of Birth: 07/20/1966 ? ?Date: 03/29/2022  PCP: Owens Loffler, MD  Referral: Owens Loffler, MD ? ?Chief Complaint  ?Patient presents with  ? Blood In Stools  ? Referral  ?  Rheumatology  ? ? ?This visit occurred during the SARS-CoV-2 public health emergency.  Safety protocols were in place, including screening questions prior to the visit, additional usage of staff PPE, and extensive cleaning of exam room while observing appropriate contact time as indicated for disinfecting solutions.  ? ?Subjective:  ? ?Randy Combs is a 56 y.o. very pleasant male patient with Body mass index is 39.92 kg/m?. who presents with the following: ? ?Alwyn is here to discuss several different medical issues. ? ?Blood in stool: He has been intermittently having some bright red blood when he is wiping for some time.  Does have a history of having a major GI bleed that required 2 units of blood roughly 6 years ago.  This was after extensive indomethacin use.  He does still use some indomethacin, at times once a day, at x9, and rarely more than this. ? ?He has had gout that has been exceedingly difficult to control.  Multiple medication failures.  I have cautiously allow him to do some as needed indomethacin at this point. ? ?Right now he is having global polyarthralgia, intermittent swelling, pain in the finger joints, MCPs, wrist, elbows, feet, ankles, toes, MTP joints, knees.  He is having more pain than focal gout attacks.  He also has some pain in the heel as well. ? ? ?He also does have diabetes, he has been off of his diabetic medication for some time.  No blood sugars for reference. ? ?BP is stable off of all medications. ? ? ?L achilles tendonitis ? ?Review of Systems is noted in the  HPI, as appropriate ? ?Objective:  ? ?BP 138/80   Pulse (!) 105   Temp 98.2 ?F (36.8 ?C) (Oral)   Ht '5\' 10"'$  (1.778 m)   Wt 278 lb 3 oz (126.2 kg)   SpO2 95%   BMI 39.92 kg/m?  ? ?GEN: No acute distress; alert,appropriate. ?PULM: Breathing comfortably in no respiratory distress ?PSYCH: Normally interactive.  ?CV: RRR, no m/g/r  ?ABD: S, NT, ND, + BS, No rebound, No HSM  ? ?Diffuse tenderness at IP joints of the fingers, MCP, wrist, he also has some pain with movement at the elbows.  More notable in the feet with MTP joints as well as midfoot and ankle joint tenderness.  He also has some pain in the Achilles tendon on the left as well.  He also has some pain with movement at the knee and flexion. ? ?Laboratory and Imaging Data: ? ?Assessment and Plan:  ? ?  ICD-10-CM   ?1. Bright red rectal bleeding  K62.5   ?  ?2. Polyarthralgia  M25.50 CBC with Differential/Platelet  ?  Uric acid  ?  ANA Screen,IFA,Reflex Titer/Pattern,Reflex Mplx 11 Ab Cascade with IdentRA  ?  High sensitivity CRP  ?  Sedimentation rate  ?  ?3. Myalgia  M79.10 CBC with Differential/Platelet  ?  Uric acid  ?  ANA Screen,IFA,Reflex Titer/Pattern,Reflex Mplx 11 Ab Cascade with IdentRA  ?  High sensitivity CRP  ?  Sedimentation rate  ?  ?4.  Idiopathic chronic gout, multiple sites, without tophus (tophi)  M1A.00X3 Uric acid  ?  ?5. Uncontrolled type 2 diabetes mellitus with hyperglycemia, without long-term current use of insulin (HCC)  E11.65 Hemoglobin A1c  ?  ? ?From a medical standpoint, he is having some bleeding rectally.  He is to stop indomethacin use now.  No additional NSAIDs.  Check a blood count.  He is a [employer] GI patient, and I think that he really should be evaluated for ongoing bleeding. ? ?Global polyarthralgia.  It is difficult to know since the patient does have gout if he does have a additional rheumatological condition.  I am not sure if gout would explain all of his symptoms.  In the past he did have a negative CCP antibody.   I am going to obtain a broad rheumatological panel, and he would like to discuss his ongoing condition with rheumatology.  He has had essentially impossible to control gout for many years as well.  For right now, I am going to have him switch to colchicine for as needed gout flares, and have him start 1 tablet twice daily, hopefully this will decrease his ongoing gout.  In the past this has been of marginal benefit. ? ?Poorly controlled diabetes off of medication.  Assessed. ? ?No orders of the defined types were placed in this encounter. ? ?Medications Discontinued During This Encounter  ?Medication Reason  ? metoprolol tartrate (LOPRESSOR) 100 MG tablet   ? amLODipine (NORVASC) 10 MG tablet   ? ?Orders Placed This Encounter  ?Procedures  ? CBC with Differential/Platelet  ? Uric acid  ? ANA Screen,IFA,Reflex Titer/Pattern,Reflex Mplx 11 Ab Cascade with IdentRA  ? High sensitivity CRP  ? Sedimentation rate  ? Hemoglobin A1c  ? ? ?Follow-up: No follow-ups on file. ? ?Dragon [employer] One speech-to-text software was used for transcription in this dictation.  Possible transcriptional errors can occur using Editor, commissioning.  ? ?Signed, ? ?Mukhtar Shams T. Vantasia Pinkney, MD ? ? ?Outpatient Encounter Medications as of 03/29/2022  ?Medication Sig  ? indomethacin (INDOCIN) 50 MG capsule TAKE 1 CAPSULE BY MOUTH THREE TIMES A DAY AS NEEDED  ? colchicine 0.6 MG tablet Take 1 tablet (0.6 mg total) by mouth 2 (two) times daily. (Patient not taking: Reported on 03/29/2022)  ? metFORMIN (GLUCOPHAGE XR) 500 MG 24 hr tablet Take 2 tablets (1,000 mg total) by mouth daily with breakfast. (Patient not taking: Reported on 03/29/2022)  ? omeprazole (PRILOSEC) 20 MG capsule Take 1 capsule (20 mg total) by mouth daily. (Patient not taking: Reported on 03/29/2022)  ? [DISCONTINUED] amLODipine (NORVASC) 10 MG tablet TAKE 1 TABLET BY MOUTH EVERY DAY (Patient not taking: Reported on 12/16/2021)  ? [DISCONTINUED] metoprolol tartrate (LOPRESSOR) 100 MG tablet Take  1 tablet (100 mg total) by mouth once for 1 dose. Take 2 hours prior to your CT scan. (Patient not taking: Reported on 03/29/2022)  ? ?No facility-administered encounter medications on file as of 03/29/2022.  ?  ?

## 2022-03-30 LAB — CBC WITH DIFFERENTIAL/PLATELET
Basophils Absolute: 0.1 10*3/uL (ref 0.0–0.1)
Basophils Relative: 1.6 % (ref 0.0–3.0)
Eosinophils Absolute: 0.3 10*3/uL (ref 0.0–0.7)
Eosinophils Relative: 3.2 % (ref 0.0–5.0)
HCT: 46.7 % (ref 39.0–52.0)
Hemoglobin: 16.1 g/dL (ref 13.0–17.0)
Lymphocytes Relative: 26.3 % (ref 12.0–46.0)
Lymphs Abs: 2.2 10*3/uL (ref 0.7–4.0)
MCHC: 34.5 g/dL (ref 30.0–36.0)
MCV: 99.4 fl (ref 78.0–100.0)
Monocytes Absolute: 0.7 10*3/uL (ref 0.1–1.0)
Monocytes Relative: 8.4 % (ref 3.0–12.0)
Neutro Abs: 5.1 10*3/uL (ref 1.4–7.7)
Neutrophils Relative %: 60.5 % (ref 43.0–77.0)
Platelets: 264 10*3/uL (ref 150.0–400.0)
RBC: 4.7 Mil/uL (ref 4.22–5.81)
RDW: 13.8 % (ref 11.5–15.5)
WBC: 8.4 10*3/uL (ref 4.0–10.5)

## 2022-03-30 LAB — HIGH SENSITIVITY CRP: CRP, High Sensitivity: 11.8 mg/L — ABNORMAL HIGH (ref 0.000–5.000)

## 2022-03-30 LAB — URIC ACID: Uric Acid, Serum: 10.8 mg/dL — ABNORMAL HIGH (ref 4.0–7.8)

## 2022-03-30 LAB — HEMOGLOBIN A1C: Hgb A1c MFr Bld: 6.7 % — ABNORMAL HIGH (ref 4.6–6.5)

## 2022-03-30 LAB — SEDIMENTATION RATE: Sed Rate: 21 mm/hr — ABNORMAL HIGH (ref 0–20)

## 2022-04-03 ENCOUNTER — Telehealth: Payer: Self-pay

## 2022-04-03 MED ORDER — COLCHICINE 0.6 MG PO TABS: 0.6000 mg | ORAL_TABLET | Freq: Two times a day (BID) | ORAL | 2 refills | Status: DC

## 2022-04-03 NOTE — Telephone Encounter (Signed)
Legrand Como notified as instructed by telephone.  Colchicine refill sent to [facility] on [address].  ?

## 2022-04-03 NOTE — Telephone Encounter (Signed)
He is having a gout flare-up and indomethacin was stopped last week. He never started the colchicine. He is asking what can be done for pain. Send to [facility] [address]. ?

## 2022-04-03 NOTE — Telephone Encounter (Signed)
Cochicine bid acutely ?

## 2022-04-05 LAB — ANA SCREEN,IFA,REFLEX TITER/PATTERN,REFLEX MPLX 11 AB CASCADE
14-3-3 eta Protein: <0.2 ng/mL (ref <0.2)
Anti Nuclear Antibody (ANA): NEGATIVE
Cyclic Citrullin Peptide Ab: <16 UNITS
Rheumatoid fact SerPl-aCnc: <14 IU/mL (ref <14)

## 2022-04-07 ENCOUNTER — Encounter: Payer: Self-pay | Admitting: *Deleted

## 2022-04-07 ENCOUNTER — Telehealth: Payer: Self-pay | Admitting: Family Medicine

## 2022-04-07 DIAGNOSIS — Z0279 Encounter for issue of other medical certificate: Secondary | ICD-10-CM

## 2022-04-07 DIAGNOSIS — G8929 Other chronic pain: Secondary | ICD-10-CM

## 2022-04-07 DIAGNOSIS — M1A09X0 Idiopathic chronic gout, multiple sites, without tophus (tophi): Secondary | ICD-10-CM

## 2022-04-07 NOTE — Telephone Encounter (Signed)
Message sent to patient via MyChart asking for more specifics in regards to FMLA needs so paperwork can be completed.  ?

## 2022-04-07 NOTE — Telephone Encounter (Signed)
FMLA forms placed in Dr. Lillie Fragmin office in box to complete.  ?

## 2022-04-07 NOTE — Telephone Encounter (Signed)
Pt dropped off paperwork ? ?Type of forms received:FMLA ? ?Routed RA:JHHID ? ?Paperwork received by : terrill ? ? ?Individual made aware of 3-5 business day turn around (Y/N): ?y ?Form completed and patient made aware of charges(Y/N):y ? ? ?Faxed to :  ? ?Form location:  dr [place] box ?

## 2022-04-10 DIAGNOSIS — Z0279 Encounter for issue of other medical certificate: Secondary | ICD-10-CM

## 2022-04-10 NOTE — Telephone Encounter (Signed)
Called patient to notify that FMLA paperwork is complete and has been faxed.  Patient requested copy be mailed.  Copy sent to billing, scan, and retained a copy for myself. ?

## 2022-04-10 NOTE — Telephone Encounter (Signed)
Intermittent FMLA paperwork completed.  We need it scanned and a paper copy. ?

## 2022-04-25 ENCOUNTER — Ambulatory Visit: Payer: 59 | Admitting: Orthopaedic Surgery

## 2022-04-25 ENCOUNTER — Encounter: Payer: Self-pay | Admitting: Orthopaedic Surgery

## 2022-04-25 ENCOUNTER — Ambulatory Visit: Payer: Self-pay

## 2022-04-25 ENCOUNTER — Ambulatory Visit (INDEPENDENT_AMBULATORY_CARE_PROVIDER_SITE_OTHER): Payer: 59

## 2022-04-25 VITALS — Ht 70.0 in | Wt 275.4 lb

## 2022-04-25 DIAGNOSIS — M25561 Pain in right knee: Secondary | ICD-10-CM

## 2022-04-25 DIAGNOSIS — M25562 Pain in left knee: Secondary | ICD-10-CM | POA: Diagnosis not present

## 2022-04-25 DIAGNOSIS — M1712 Unilateral primary osteoarthritis, left knee: Secondary | ICD-10-CM | POA: Diagnosis not present

## 2022-04-25 MED ORDER — CELECOXIB 200 MG PO CAPS: 200.0000 mg | ORAL_CAPSULE | Freq: Two times a day (BID) | ORAL | 1 refills | Status: DC | PRN

## 2022-04-25 MED ORDER — METHYLPREDNISOLONE ACETATE 40 MG/ML IJ SUSP
40.0000 mg | INTRAMUSCULAR | Status: AC | PRN
  Administered 2022-04-25: 40 mg via INTRA_ARTICULAR

## 2022-04-25 MED ORDER — LIDOCAINE HCL 1 % IJ SOLN
3.0000 mL | INTRAMUSCULAR | Status: AC | PRN
  Administered 2022-04-25: 3 mL

## 2022-04-25 MED ORDER — TRAMADOL HCL 50 MG PO TABS: 100.0000 mg | ORAL_TABLET | Freq: Four times a day (QID) | ORAL | 0 refills | Status: DC | PRN

## 2022-04-25 NOTE — Progress Notes (Signed)
Office Visit Note   Patient: Randy Combs           Date of Birth: [birth year]           MRN: [id number] Visit Date: 04/25/2022              Requested by: Owens Loffler, MD [place],  [place] [id number] PCP: Owens Loffler, MD   Assessment & Plan: Visit Diagnoses:  1. Acute pain of right knee   2. Unilateral primary osteoarthritis, left knee     Plan: I did go over his x-rays with him in detail.  He has significant enough arthritis in his left knee that I am recommending knee replacement for when the pain gets bad enough for him.  In order to temporize pain with both knees recommend a steroid injection today and he agreed to this and tolerated it well.  I explained the risk and benefits of steroid injections.  I would like to start him on Celebrex as an anti-inflammatory since it is easier on the stomach given ultras in the past.  Also will send in some tramadol for pain.  All question concerns were answered and addressed.  He said he was going to talk to his wife about the possibility of knee replacement at some point.  He will call us if he gets to where things are worsening.  He knows to wait least 3 months between steroid injections.  Follow-Up Instructions: Return if symptoms worsen or fail to improve.   Orders:  Orders Placed This Encounter  Procedures   Large Joint Inj   Large Joint Inj   XR Knee 1-2 Views Right   XR Knee 1-2 Views Left   Meds ordered this encounter  Medications   celecoxib (CELEBREX) 200 MG capsule    Sig: Take 1 capsule (200 mg total) by mouth 2 (two) times daily as needed.    Dispense:  60 capsule    Refill:  1   traMADol (ULTRAM) 50 MG tablet    Sig: Take 2 tablets (100 mg total) by mouth every 6 (six) hours as needed.    Dispense:  40 tablet    Refill:  0      Procedures: Large Joint Inj: R knee on 04/25/2022 9:52 AM Indications: diagnostic evaluation and pain Details: 22 G 1.5 in needle, superolateral  approach  Arthrogram: No  Medications: 3 mL lidocaine 1 %; 40 mg methylPREDNISolone acetate 40 MG/ML Outcome: tolerated well, no immediate complications Procedure, treatment alternatives, risks and benefits explained, specific risks discussed. Consent was given by the patient. Immediately prior to procedure a time out was called to verify the correct patient, procedure, equipment, support staff and site/side marked as required. Patient was prepped and draped in the usual sterile fashion.    Large Joint Inj: L knee on 04/25/2022 9:52 AM Indications: diagnostic evaluation and pain Details: 22 G 1.5 in needle, superolateral approach  Arthrogram: No  Medications: 3 mL lidocaine 1 %; 40 mg methylPREDNISolone acetate 40 MG/ML Outcome: tolerated well, no immediate complications Procedure, treatment alternatives, risks and benefits explained, specific risks discussed. Consent was given by the patient. Immediately prior to procedure a time out was called to verify the correct patient, procedure, equipment, support staff and site/side marked as required. Patient was prepped and draped in the usual sterile fashion.      Clinical Data: No additional findings.   Subjective: Chief Complaint  Patient presents with   Right Knee - Pain  Left Knee - Pain  The patient is well-known to me.  He is a 56 year old gentleman we have not seen in a while.  The last time we saw him was in 2021 and placed hyaluronic acid in the left knee to treat pain from osteoarthritis.  He is an avid fisherman.  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  He has been having worsening knee pain over the last week or 2.  It flared up after climbing steps at the beach recently.  He has not worked in 6 days due to his bilateral knee pain.  He says his right knee is swollen quite a bit the left knee hurts him worse.  He is diabetic but reports good control.  His weight is 275  pounds.  He is an avid fisherman again and is going on a fishing trip in the next week.  HPI  Review of Systems He currently denies any headache, chest pain, shortness of breath, fever, chills, nausea, vomiting  Objective: Vital Signs: Ht '5\' 10"'$  (1.778 m)   Wt 275 lb 6.4 oz (124.9 kg)   BMI 39.52 kg/m   Physical Exam He is alert and orient x3 and in no acute distress Ortho Exam Examination of both knees shows a slight effusion on the left knee but no effusion of the right knee.  The left knee has significant varus malalignment but the right knee is straighter.  He has a painful arc of motion of both knees with patellofemoral crepitation.  Both knees are ligamentously stable. Specialty Comments:  No specialty comments available.  Imaging: XR Knee 1-2 Views Left  Result Date: 04/25/2022 2 views of the left knee show tricompartment arthritis with varus malalignment, medial joint line tenderness with bone-on-bone wear the medial aspect of the knee.  There is sclerotic changes and significant patellofemoral arthritic changes.  There are particular osteophytes around the knee.  XR Knee 1-2 Views Right  Result Date: 04/25/2022 2 views of the right knee show only slight varus malalignment with slight medial joint space narrowing and patellofemoral narrowing.  This is moderate tricompartment arthritis.    PMFS History: Patient Active Problem List   Diagnosis Date Noted   Dip use 07/05/2021   Uncontrolled type 2 diabetes mellitus with hyperglycemia, without long-term current use of insulin ([place]) 06/12/2021   Hiatal hernia 03/09/2016   Anemia, iron deficiency 03/01/2015   Idiopathic chronic gout, multiple sites, without tophus (tophi) 03/01/2015   Essential hypertension 03/04/2014   Past Medical History:  Diagnosis Date   Alcohol abuse, in remission    quit in 2008   Bleeding ulcer    Blood transfusion without reported diagnosis    Glaucoma    Gout of multiple sites 03/01/2015    Hiatal hernia    Hypertension    Uncontrolled type 2 diabetes mellitus, without long-term current use of insulin 06/12/2021    Family History  Adopted: Yes  Problem Relation Age of Onset   Lung cancer Mother    Healthy Father    Healthy Child    Healthy Child    Colon cancer Neg Hx    Esophageal cancer Neg Hx     Past Surgical History:  Procedure Laterality Date   APPENDECTOMY  1970's   HERNIA REPAIR     abdominal   Social History   Occupational History   Not on file  Tobacco Use   Smoking status: Former    Types: Cigars  Quit date: 02/07/2013    Years since quitting: 9.2   Smokeless tobacco: Current    Types: Snuff  Substance and Sexual Activity   Alcohol use: No    Comment: Quit in 2008   Drug use: No   Sexual activity: Not Currently

## 2022-04-30 DIAGNOSIS — Z0279 Encounter for issue of other medical certificate: Secondary | ICD-10-CM

## 2022-05-14 NOTE — Progress Notes (Deleted)
Office Visit Note  Patient: Randy Combs             Date of Birth: [birth year]           MRN: [id number]             PCP: Owens Loffler, MD Referring: Owens Loffler, MD Visit Date: 05/15/2022 Occupation: @GUAROCC @  Subjective:  No chief complaint on file.   History of Present Illness: Randy Combs is a 56 y.o. male here for chronic polyarticular gout. He has inflammation of multiple areas for years. Currently prescribed colchicine for this he developed GI bleeding with more intensive use of indomethacin previously. He also suffers from osteoarthritis of multiple areas for which he sees Dr. Ninfa Linden with recent knee injection and previous HA supplementation.***   Labs reviewed 03/2022 ANA neg RF neg CCP neg 14-3-3 neg Uric acid 10.8 ESR 21 CRP 11.8  Activities of Daily Living:  Patient reports morning stiffness for *** {minute/hour:19697}.   Patient {ACTIONS;DENIES/REPORTS:21021675::"Denies"} nocturnal pain.  Difficulty dressing/grooming: {ACTIONS;DENIES/REPORTS:21021675::"Denies"} Difficulty climbing stairs: {ACTIONS;DENIES/REPORTS:21021675::"Denies"} Difficulty getting out of chair: {ACTIONS;DENIES/REPORTS:21021675::"Denies"} Difficulty using hands for taps, buttons, cutlery, and/or writing: {ACTIONS;DENIES/REPORTS:21021675::"Denies"}  No Rheumatology ROS completed.   PMFS History:  Patient Active Problem List   Diagnosis Date Noted   Dip use 07/05/2021   Uncontrolled type 2 diabetes mellitus with hyperglycemia, without long-term current use of insulin ([place]) 06/12/2021   Hiatal hernia 03/09/2016   Anemia, iron deficiency 03/01/2015   Idiopathic chronic gout, multiple sites, without tophus (tophi) 03/01/2015   Essential hypertension 03/04/2014    Past Medical History:  Diagnosis Date   Alcohol abuse, in remission    quit in 2008   Bleeding ulcer    Blood transfusion without reported diagnosis    Glaucoma    Gout of multiple sites 03/01/2015    Hiatal hernia    Hypertension    Uncontrolled type 2 diabetes mellitus, without long-term current use of insulin 06/12/2021    Family History  Adopted: Yes  Problem Relation Age of Onset   Lung cancer Mother    Healthy Father    Healthy Child    Healthy Child    Colon cancer Neg Hx    Esophageal cancer Neg Hx    Past Surgical History:  Procedure Laterality Date   APPENDECTOMY  1970's   HERNIA REPAIR     abdominal   Social History   Social History Narrative   Grading work (runs equipment)      Married      Regular exercise      Quit alcohol;tobacco      No drugs   Immunization History  Administered Date(s) Administered   Influenza,inj,Quad PF,6+ Mos 10/12/2017, 10/23/2019   PFIZER(Purple Top)SARS-COV-2 Vaccination 06/22/2020, 10/06/2020   PNEUMOCOCCAL CONJUGATE-20 07/04/2021   Td 12/04/2005   Tdap 07/04/2012   Zoster Recombinat (Shingrix) 07/04/2021     Objective: Vital Signs: There were no vitals taken for this visit.   Physical Exam   Musculoskeletal Exam: ***  CDAI Exam: CDAI Score: -- Patient Global: --; Provider Global: -- Swollen: --; Tender: -- Joint Exam 05/15/2022   No joint exam has been documented for this visit   There is currently no information documented on the homunculus. Go to the Rheumatology activity and complete the homunculus joint exam.  Investigation: No additional findings.  Imaging: XR Knee 1-2 Views Right  Result Date: 04/25/2022 2 views of the right knee show only slight varus malalignment with slight medial joint space narrowing and  patellofemoral narrowing.  This is moderate tricompartment arthritis.  XR Knee 1-2 Views Left  Result Date: 04/25/2022 2 views of the left knee show tricompartment arthritis with varus malalignment, medial joint line tenderness with bone-on-bone wear the medial aspect of the knee.  There is sclerotic changes and significant patellofemoral arthritic changes.  There are particular osteophytes  around the knee.   Recent Labs: Lab Results  Component Value Date   WBC 8.4 03/29/2022   HGB 16.1 03/29/2022   PLT 264.0 03/29/2022   NA 143 12/16/2021   K 4.5 12/16/2021   CL 107 (H) 12/16/2021   CO2 20 12/16/2021   GLUCOSE 105 (H) 12/16/2021   BUN 27 (H) 12/16/2021   CREATININE 1.11 12/16/2021   BILITOT 1.4 (H) 12/12/2021   ALKPHOS 96 12/12/2021   AST 39 12/12/2021   ALT 40 12/12/2021   PROT 8.0 12/12/2021   ALBUMIN 3.9 12/12/2021   CALCIUM 9.8 12/16/2021   GFRAA >60 08/17/2019    Speciality Comments: No specialty comments available.  Procedures:  No procedures performed Allergies: Aspirin   Assessment / Plan:     Visit Diagnoses: No diagnosis found.  Orders: No orders of the defined types were placed in this encounter.  No orders of the defined types were placed in this encounter.   Face-to-face time spent with patient was *** minutes. Greater than 50% of time was spent in counseling and coordination of care.  Follow-Up Instructions: No follow-ups on file.   Collier Salina, MD  Note - This record has been created using [employer].  Chart creation errors have been sought, but may not always  have been located. Such creation errors do not reflect on  the standard of medical care.

## 2022-05-15 ENCOUNTER — Ambulatory Visit: Payer: 59 | Admitting: Internal Medicine

## 2022-05-16 ENCOUNTER — Telehealth: Payer: Self-pay

## 2022-05-16 NOTE — Telephone Encounter (Signed)
Patient left voice mail inquiring about scheduling TKA.  Please review, especially upcoming appointments.  Okay to schedule?

## 2022-05-18 NOTE — Telephone Encounter (Signed)
I spoke with patient and scheduled surgery.  Which knee, [facility] or [facility]?

## 2022-05-24 ENCOUNTER — Ambulatory Visit: Payer: 59 | Admitting: Internal Medicine

## 2022-05-24 NOTE — Progress Notes (Deleted)
Office Visit Note  Patient: Randy Combs             Date of Birth: Feb 05, 1966           MRN: [id number]             PCP: Owens Loffler, MD Referring: Owens Loffler, MD Visit Date: 05/24/2022 Occupation: '@GUAROCC'$ @  Subjective:  No chief complaint on file.   History of Present Illness: Randy Combs is a 56 y.o. male here for evaluation of chronic arthritis with a longstanding history of apparently gouty arthritis since he was young although not definitively identified by arthrocentesis and limited response to urate lower therapy with allopurinol and uloric. He has taken significant NSAID doses for symptoms complicated by GI bleeding. Workup has otherwise been negative for RF, CCP, and ANA autoantibodies. Inflammatory markers especially CRP intermittently highly elevated. He also has considerable osteoarthritis of multiple sites with fairly demanding occupational history.***   Activities of Daily Living:  Patient reports morning stiffness for *** {minute/hour:19697}.   Patient {ACTIONS;DENIES/REPORTS:21021675::"Denies"} nocturnal pain.  Difficulty dressing/grooming: {ACTIONS;DENIES/REPORTS:21021675::"Denies"} Difficulty climbing stairs: {ACTIONS;DENIES/REPORTS:21021675::"Denies"} Difficulty getting out of chair: {ACTIONS;DENIES/REPORTS:21021675::"Denies"} Difficulty using hands for taps, buttons, cutlery, and/or writing: {ACTIONS;DENIES/REPORTS:21021675::"Denies"}  No Rheumatology ROS completed.   PMFS History:  Patient Active Problem List   Diagnosis Date Noted   Dip use 07/05/2021   Uncontrolled type 2 diabetes mellitus with hyperglycemia, without long-term current use of insulin ([place]) 06/12/2021   Hiatal hernia 03/09/2016   Anemia, iron deficiency 03/01/2015   Idiopathic chronic gout, multiple sites, without tophus (tophi) 03/01/2015   Essential hypertension 03/04/2014    Past Medical History:  Diagnosis Date   Alcohol abuse, in remission    quit in 2008    Bleeding ulcer    Blood transfusion without reported diagnosis    Glaucoma    Gout of multiple sites 03/01/2015   Hiatal hernia    Hypertension    Uncontrolled type 2 diabetes mellitus, without long-term current use of insulin 06/12/2021    Family History  Adopted: Yes  Problem Relation Age of Onset   Lung cancer Mother    Healthy Father    Healthy Child    Healthy Child    Colon cancer Neg Hx    Esophageal cancer Neg Hx    Past Surgical History:  Procedure Laterality Date   APPENDECTOMY  1970's   HERNIA REPAIR     abdominal   Social History   Social History Narrative   Grading work (runs equipment)      Married      Regular exercise      Quit alcohol;tobacco      No drugs   Immunization History  Administered Date(s) Administered   Influenza,inj,Quad PF,6+ Mos 10/12/2017, 10/23/2019   PFIZER(Purple Top)SARS-COV-2 Vaccination 06/22/2020, 10/06/2020   PNEUMOCOCCAL CONJUGATE-20 07/04/2021   Td 12/04/2005   Tdap 07/04/2012   Zoster Recombinat (Shingrix) 07/04/2021     Objective: Vital Signs: There were no vitals taken for this visit.   Physical Exam   Musculoskeletal Exam: ***  CDAI Exam: CDAI Score: -- Patient Global: --; Provider Global: -- Swollen: --; Tender: -- Joint Exam 05/24/2022   No joint exam has been documented for this visit   There is currently no information documented on the homunculus. Go to the Rheumatology activity and complete the homunculus joint exam.  Investigation: No additional findings.  Imaging: XR Knee 1-2 Views Right  Result Date: 04/25/2022 2 views of the right knee show only slight varus  malalignment with slight medial joint space narrowing and patellofemoral narrowing.  This is moderate tricompartment arthritis.  XR Knee 1-2 Views Left  Result Date: 04/25/2022 2 views of the left knee show tricompartment arthritis with varus malalignment, medial joint line tenderness with bone-on-bone wear the medial aspect of the  knee.  There is sclerotic changes and significant patellofemoral arthritic changes.  There are particular osteophytes around the knee.   Recent Labs: Lab Results  Component Value Date   WBC 8.4 03/29/2022   HGB 16.1 03/29/2022   PLT 264.0 03/29/2022   NA 143 12/16/2021   K 4.5 12/16/2021   CL 107 (H) 12/16/2021   CO2 20 12/16/2021   GLUCOSE 105 (H) 12/16/2021   BUN 27 (H) 12/16/2021   CREATININE 1.11 12/16/2021   BILITOT 1.4 (H) 12/12/2021   ALKPHOS 96 12/12/2021   AST 39 12/12/2021   ALT 40 12/12/2021   PROT 8.0 12/12/2021   ALBUMIN 3.9 12/12/2021   CALCIUM 9.8 12/16/2021   GFRAA >60 08/17/2019    Speciality Comments: No specialty comments available.  Procedures:  No procedures performed Allergies: Aspirin   Assessment / Plan:     Visit Diagnoses: No diagnosis found.  Orders: No orders of the defined types were placed in this encounter.  No orders of the defined types were placed in this encounter.   Face-to-face time spent with patient was *** minutes. Greater than 50% of time was spent in counseling and coordination of care.  Follow-Up Instructions: No follow-ups on file.   Collier Salina, MD  Note - This record has been created using [employer].  Chart creation errors have been sought, but may not always  have been located. Such creation errors do not reflect on  the standard of medical care.

## 2022-05-25 ENCOUNTER — Other Ambulatory Visit: Payer: Self-pay

## 2022-06-16 ENCOUNTER — Telehealth: Payer: Self-pay | Admitting: Orthopaedic Surgery

## 2022-06-16 NOTE — Telephone Encounter (Signed)
Pt submitted medical release form, FMLA forms. And cash paymnet to [employer]. Accepted 06/16/22

## 2022-06-20 ENCOUNTER — Ambulatory Visit: Payer: 59 | Admitting: Gastroenterology

## 2022-06-21 ENCOUNTER — Emergency Department
Admission: EM | Admit: 2022-06-21 | Discharge: 2022-06-21 | Disposition: A | Discharge status: Home / Self Care | Payer: 59 | Attending: Emergency Medicine | Admitting: Emergency Medicine

## 2022-06-21 ENCOUNTER — Encounter: Payer: Self-pay | Admitting: Emergency Medicine

## 2022-06-21 ENCOUNTER — Other Ambulatory Visit: Payer: Self-pay

## 2022-06-21 DIAGNOSIS — E119 Type 2 diabetes mellitus without complications: Secondary | ICD-10-CM | POA: Diagnosis not present

## 2022-06-21 DIAGNOSIS — M62830 Muscle spasm of back: Secondary | ICD-10-CM | POA: Diagnosis not present

## 2022-06-21 DIAGNOSIS — I10 Essential (primary) hypertension: Secondary | ICD-10-CM | POA: Diagnosis not present

## 2022-06-21 DIAGNOSIS — M545 Low back pain, unspecified: Secondary | ICD-10-CM

## 2022-06-21 DIAGNOSIS — M549 Dorsalgia, unspecified: Secondary | ICD-10-CM | POA: Diagnosis present

## 2022-06-21 MED ORDER — KETOROLAC TROMETHAMINE 15 MG/ML IJ SOLN
15.0000 mg | Freq: Once | INTRAMUSCULAR | Status: AC
  Administered 2022-06-21: 15 mg via INTRAMUSCULAR
  Filled 2022-06-21: qty 1

## 2022-06-21 MED ORDER — LIDOCAINE 5 % EX PTCH
1.0000 | MEDICATED_PATCH | CUTANEOUS | Status: DC
  Administered 2022-06-21: 1 via TRANSDERMAL
  Filled 2022-06-21: qty 1

## 2022-06-21 MED ORDER — CYCLOBENZAPRINE HCL 5 MG PO TABS: 5.0000 mg | ORAL_TABLET | Freq: Every day | ORAL | 0 refills | Status: DC

## 2022-06-21 MED ORDER — ACETAMINOPHEN 325 MG PO TABS
650.0000 mg | ORAL_TABLET | Freq: Once | ORAL | Status: AC
Start: 2022-06-21 — End: 2022-06-21
  Administered 2022-06-21: 650 mg via ORAL
  Filled 2022-06-21: qty 2

## 2022-06-21 MED ORDER — CYCLOBENZAPRINE HCL 10 MG PO TABS
5.0000 mg | ORAL_TABLET | Freq: Once | ORAL | Status: AC
  Administered 2022-06-21: 5 mg via ORAL
  Filled 2022-06-21: qty 1

## 2022-06-21 NOTE — ED Notes (Signed)
Patient stated discharge understanding

## 2022-06-21 NOTE — ED Triage Notes (Signed)
C/O pulling something in left lower back today at work at around noon time.  C?O right lower back pain.

## 2022-06-21 NOTE — ED Provider Notes (Signed)
[facility] Provider Note    Event Date/Time   First MD Initiated Contact with Patient 06/21/22 1944     (approximate)   History   Back Pain   HPI  Randy Combs is a 57 y.o. male with a past medical history of diabetes, gout, hypertension who presents today for evaluation of back pain.  Patient reports that his pain began today.  He reports it is worse with any sort of movement including truncal rotation.  He denies radiation of his pain into his legs or into his abdomen.  He denies chest pain or shortness of breath.  He denies any known injuries.  He has not noticed any numbness or tingling or weakness in his arms or legs.  No saddle anesthesia.  No history of IV drug use.  No fevers.  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  Patient Active Problem List   Diagnosis Date Noted   Dip use 07/05/2021   Uncontrolled type 2 diabetes mellitus with hyperglycemia, without long-term current use of insulin ([place]) 06/12/2021   Hiatal hernia 03/09/2016   Anemia, iron deficiency 03/01/2015   Idiopathic chronic gout, multiple sites, without tophus (tophi) 03/01/2015   Essential hypertension 03/04/2014          Physical Exam   Triage Vital Signs: ED Triage Vitals  Enc Vitals Group     BP 06/21/22 1947 (!) 145/88     Pulse Rate 06/21/22 1947 85     Resp 06/21/22 1947 17     Temp 06/21/22 1947 98.1 F (36.7 C)     Temp Source 06/21/22 1947 Oral     SpO2 06/21/22 1947 95 %     Weight 06/21/22 1828 275 lb 5.7 oz (124.9 kg)     Height 06/21/22 1828 '5\' 10"'$  (1.778 m)     Head Circumference --      Peak Flow --      Pain Score 06/21/22 1828 8     Pain Loc --      Pain Edu? --      Excl. in [place]? --     Most recent vital signs: Vitals:   06/21/22 1947  BP: (!) 145/88  Pulse: 85  Resp: 17  Temp: 98.1 F (36.7 C)  SpO2: 95%    Physical Exam Vitals and nursing note reviewed.  Constitutional:      General: Awake and alert. No  acute distress.    Appearance: Normal appearance. The patient is overweight.  HENT:     Head: Normocephalic and atraumatic.     Mouth: Mucous membranes are moist.  Eyes:     General: PERRL. Normal EOMs        Right eye: No discharge.        Left eye: No discharge.     Conjunctiva/sclera: Conjunctivae normal.  Cardiovascular:     Rate and Rhythm: Normal rate and regular rhythm.     Pulses: Normal pulses.     Heart sounds: Normal heart sounds Pulmonary:     Effort: Pulmonary effort is normal. No respiratory distress.     Breath sounds: Normal breath sounds.  Abdominal:     Abdomen is soft. There is no abdominal tenderness. No rebound or guarding. No distention. Back: No midline tenderness.  Right-sided lumbar paraspinal muscle tenderness without ecchymosis or skin changes.  Strength and sensation 5/5 to bilateral lower extremities. Normal great toe extension against resistance. Normal sensation throughout feet. Normal patellar reflexes. Negative SLR  and opposite SLR bilaterally.  Patient endorses mild discomfort in his right low back with active straight leg rise.  No pain with passive straight leg rise Musculoskeletal:        General: No swelling. Normal range of motion.     Cervical back: Normal range of motion and neck supple.  Skin:    General: Skin is warm and dry.     Capillary Refill: Capillary refill takes less than 2 seconds.     Findings: No rash.  Neurological:     Mental Status: The patient is awake and alert.      ED Results / Procedures / Treatments   Labs (all labs ordered are listed, but only abnormal results are displayed) Labs Reviewed - No data to display   EKG     RADIOLOGY     PROCEDURES:  Critical Care performed:   Procedures   MEDICATIONS ORDERED IN ED: Medications  lidocaine (LIDODERM) 5 % 1 patch (1 patch Transdermal Patch Applied 06/21/22 2021)  ketorolac (TORADOL) 15 MG/ML injection 15 mg (15 mg Intramuscular Given 06/21/22 2026)   acetaminophen (TYLENOL) tablet 650 mg (650 mg Oral Given 06/21/22 2022)  cyclobenzaprine (FLEXERIL) tablet 5 mg (5 mg Oral Given 06/21/22 2022)     IMPRESSION / MDM / ASSESSMENT AND PLAN / ED COURSE  I reviewed the triage vital signs and the nursing notes.   Differential diagnosis includes, but is not limited to, spasm, contusion, fracture, dislocation.  Patient is awake and alert, hemodynamically stable and afebrile. He has 5 out of 5 strength with intact sensation to extensor hallucis dorsiflexion and plantarflexion of bilateral lower extremities with normal patellar reflexes bilaterally. Most likely etiology at this point is muscle strain vs spasm. No red flags to indicate patient is at risk for more auspicious process that would require urgent/emergent spinal imaging or subspecialty evaluation at this time. No major trauma, no midline tenderness, no history or physical exam findings to suggest cauda equina syndrome or spinal cord compression. No focal neurological deficits on exam. No constitutional symptoms or history of immunosuppression or IVDA to suggest potential for epidural abscess. Not anticoagulated, no history of bleeding diastasis to suggest risk for epidural hematoma. No chronic steroid use or advanced age or history of malignancy to suggest proclivity towards pathological fracture.  No abdominal pain or flank pain to suggest kidney stone, no history of kidney stone.  No fever or dysuria or CVAT to suggest pyelonephritis.  No chest pain, back pain, shortness of breath, neurological deficits, to suggest vascular catastrophe, and pulses are equal in all 4 extremities.  Discussed care instructions and return precautions with patient. Recommended close outpatient follow-up for re-evaluation. Patient agrees with plan of care. Will treat the patient symptomatically as needed for pain control. Will discharge patient to take these medications and return for any worsening or different pain or  development of any neurologic symptoms.  Patient was advised that he cannot drive, operate heavy machinery, or perform any test that require concentration while taking the Flexeril.  He understands and agrees.  He was discharged in the care of his wife who is driving tonight.  He was reevaluated multiple times and reports that his pain is improved significantly.  He is able to ambulate with a steady gait.  Educated patient regarding expected time course for back pain to improve and recommended very close outpatient follow-up.    Patient's presentation is most consistent with acute complicated illness / injury requiring diagnostic workup.  FINAL CLINICAL IMPRESSION(S) / ED DIAGNOSES   Final diagnoses:  Muscle spasm of back  Acute right-sided low back pain without sciatica     Rx / DC Orders   ED Discharge Orders          Ordered    cyclobenzaprine (FLEXERIL) 5 MG tablet  Daily at bedtime        06/21/22 2152             Note:  This document was prepared using Dragon voice recognition software and may include unintentional dictation errors.   Emeline Gins 06/21/22 2203    Blake Divine, MD 06/22/22 1104

## 2022-06-21 NOTE — Discharge Instructions (Signed)
You may take the medication as needed for your back pain.  Please remember that this may make you very sleepy, and may impair your judgment, and you cannot drive, operate heavy machinery, or perform any test that require concentration while taking this medication.  You may take Tylenol 650 mg or ibuprofen 600 mg every 6-8 hours as well.  Please return for any new, worsening, or change in symptoms or other concerns.  It was a pleasure caring for you today.

## 2022-06-23 ENCOUNTER — Other Ambulatory Visit: Payer: Self-pay | Admitting: Physician Assistant

## 2022-06-23 DIAGNOSIS — M1712 Unilateral primary osteoarthritis, left knee: Secondary | ICD-10-CM

## 2022-06-27 ENCOUNTER — Telehealth: Payer: Self-pay

## 2022-06-27 NOTE — Telephone Encounter (Signed)
error 

## 2022-06-27 NOTE — Telephone Encounter (Signed)
LMOM of the below message for the patient

## 2022-06-27 NOTE — Telephone Encounter (Signed)
This pt is sch for a total knee on 07/07/2022 with Cb. He states that he had a back injury and was treated in the ER 06/21/2022 and does not know if he should post pone his total knee or if it is ok to proceed given that he has this new back injury. Please call [phone]

## 2022-06-27 NOTE — Telephone Encounter (Signed)
The patient returned your call, leaving a message on the triage vm. He can be reached at [phone].

## 2022-06-28 ENCOUNTER — Ambulatory Visit (INDEPENDENT_AMBULATORY_CARE_PROVIDER_SITE_OTHER): Payer: 59

## 2022-06-28 ENCOUNTER — Telehealth: Payer: Self-pay | Admitting: Orthopaedic Surgery

## 2022-06-28 ENCOUNTER — Ambulatory Visit: Payer: 59 | Admitting: Orthopaedic Surgery

## 2022-06-28 DIAGNOSIS — M5441 Lumbago with sciatica, right side: Secondary | ICD-10-CM

## 2022-06-28 MED ORDER — CYCLOBENZAPRINE HCL 10 MG PO TABS: 10.0000 mg | ORAL_TABLET | Freq: Three times a day (TID) | ORAL | 0 refills | Status: DC | PRN

## 2022-06-28 MED ORDER — TRAMADOL HCL 50 MG PO TABS: 100.0000 mg | ORAL_TABLET | Freq: Four times a day (QID) | ORAL | 0 refills | Status: DC | PRN

## 2022-06-28 NOTE — Telephone Encounter (Signed)
Received $25.00 cash,medical records release form and FMLA paperwork from patient/Forwarding to [employer] today 

## 2022-06-28 NOTE — Progress Notes (Signed)
The patient is well-known to me.  He is a 56 year old gentleman who is scheduled to have a left total knee arthroplasty the Friday after next.  He injured his back recently at work but it was not [employer] related.  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  He denies any radicular component of this pain.  He had some Flexeril that is helped some.  He has negative straight leg raise of the right side.  He does have pain in the posterior elements of his lumbar spine all to the right side and at the facet joints.  This is limiting his flexion extension but he is walking well.  He has excellent strength in his bilateral lower extremities.  2 views lumbar spine showed significant degenerative changes between L4-L5 and L5-S1.  This involves the disc space and the posterior elements.  There is also stenosis of the foramina at those levels.  We will now try Flexeril and tramadol for him and he says he it is helping.  I also recommended Voltaren gel.  He is mobile enough and looking good enough to proceed with knee replacement surgery next week.  He agrees with this as well.

## 2022-06-29 NOTE — Patient Instructions (Signed)
DUE TO COVID-19 ONLY TWO VISITORS  (aged 55 and older)  ARE ALLOWED TO COME WITH YOU AND STAY IN THE WAITING ROOM ONLY DURING PRE OP AND PROCEDURE.   **NO VISITORS ARE ALLOWED IN THE SHORT STAY AREA OR RECOVERY ROOM!!**  IF YOU WILL BE ADMITTED INTO THE HOSPITAL YOU ARE ALLOWED ONLY FOUR SUPPORT PEOPLE DURING VISITATION HOURS ONLY (7 AM -8PM)   The support person(s) must pass our screening, gel in and out, and wear a mask at all times, including in the patient's room. Patients must also wear a mask when staff or their support person are in the room. Visitors GUEST BADGE MUST BE WORN VISIBLY  One adult visitor may remain with you overnight and MUST be in the room by 8 P.M.     Your procedure is scheduled on: 07/07/22   Report to [facility] Main Entrance    Report to admitting at : 6:15 AM   Call this number if you have problems the morning of surgery [phone]   Do not eat food :After Midnight.   After Midnight you may have the following liquids until: 5:30 AM DAY OF SURGERY  Water Black Coffee (sugar ok, NO MILK/CREAM OR CREAMERS)  Tea (sugar ok, NO MILK/CREAM OR CREAMERS) regular and decaf                             Plain Jell-O (NO RED)                                           Fruit ices (not with fruit pulp, NO RED)                                     Popsicles (NO RED)                                                                  Juice: apple, WHITE grape, WHITE cranberry Sports drinks like Gatorade (NO RED)              Drink G2 drink AT : 5:30 AM the day of surgery.       The day of surgery:  Drink ONE (1) Pre-Surgery Clear Ensure or G2 at AM the morning of surgery. Drink in one sitting. Do not sip.  This drink was given to you during your hospital  pre-op appointment visit. Nothing else to drink after completing the  Pre-Surgery Clear Ensure or G2.          If you have questions, please contact your surgeon's office   Oral Hygiene is also important to  reduce your risk of infection.                                    Remember - BRUSH YOUR TEETH THE MORNING OF SURGERY WITH YOUR REGULAR TOOTHPASTE   Do NOT smoke after Midnight   Take these medicines the morning of surgery with A  SIP OF WATER: N/A  DO NOT TAKE ANY ORAL DIABETIC MEDICATIONS DAY OF YOUR SURGERY  Bring CPAP mask and tubing day of surgery.                              You may not have any metal on your body including hair pins, jewelry, and body piercing             Do not wear lotions, powders, perfumes/cologne, or deodorant              Men may shave face and neck.   Do not bring valuables to the hospital. [name].   Contacts, dentures or bridgework may not be worn into surgery.   Bring small overnight bag day of surgery.   DO NOT [place]. PHARMACY WILL DISPENSE MEDICATIONS LISTED ON YOUR MEDICATION LIST TO YOU DURING YOUR ADMISSION Randy Combs!    Patients discharged on the day of surgery will not be allowed to drive home.  Someone NEEDS to stay with you for the first 24 hours after anesthesia.   Special Instructions: Bring a copy of your healthcare power of attorney and living will documents         the day of surgery if you haven't scanned them before.              Please read over the following fact sheets you were given: IF YOU HAVE QUESTIONS ABOUT YOUR PRE-OP INSTRUCTIONS PLEASE CALL [phone]     [name] - Preparing for Surgery Before surgery, you can play an important role.  Because skin is not sterile, your skin needs to be as free of germs as possible.  You can reduce the number of germs on your skin by washing with CHG (chlorahexidine gluconate) soap before surgery.  CHG is an antiseptic cleaner which kills germs and bonds with the skin to continue killing germs even after washing. Please DO NOT use if you have an allergy to CHG or antibacterial soaps.  If your  skin becomes reddened/irritated stop using the CHG and inform your nurse when you arrive at Short Stay. Do not shave (including legs and underarms) for at least 48 hours prior to the first CHG shower.  You may shave your face/neck. Please follow these instructions carefully:  1.  Shower with CHG Soap the night before surgery and the  morning of Surgery.  2.  If you choose to wash your hair, wash your hair first as usual with your  normal  shampoo.  3.  After you shampoo, rinse your hair and body thoroughly to remove the  shampoo.                           4.  Use CHG as you would any other liquid soap.  You can apply chg directly  to the skin and wash                       Gently with a scrungie or clean washcloth.  5.  Apply the CHG Soap to your body ONLY FROM THE NECK DOWN.   Do not use on face/ open  Wound or open sores. Avoid contact with eyes, ears mouth and genitals (private parts).                       Wash face,  Genitals (private parts) with your normal soap.             6.  Wash thoroughly, paying special attention to the area where your surgery  will be performed.  7.  Thoroughly rinse your body with warm water from the neck down.  8.  DO NOT shower/wash with your normal soap after using and rinsing off  the CHG Soap.                9.  Pat yourself dry with a clean towel.            10.  Wear clean pajamas.            11.  Place clean sheets on your bed the night of your first shower and do not  sleep with pets. Day of Surgery : Do not apply any lotions/deodorants the morning of surgery.  Please wear clean clothes to the hospital/surgery center.  FAILURE TO FOLLOW THESE INSTRUCTIONS MAY RESULT IN THE CANCELLATION OF YOUR SURGERY PATIENT SIGNATURE_________________________________  NURSE SIGNATURE__________________________________  ________________________________________________________________________   Randy Combs  An incentive spirometer is  a tool that can help keep your lungs clear and active. This tool measures how well you are filling your lungs with each breath. Taking long deep breaths may help reverse or decrease the chance of developing breathing (pulmonary) problems (especially infection) following: A long period of time when you are unable to move or be active. BEFORE THE PROCEDURE  If the spirometer includes an indicator to show your best effort, your nurse or respiratory therapist will set it to a desired goal. If possible, sit up straight or lean slightly forward. Try not to slouch. Hold the incentive spirometer in an upright position. INSTRUCTIONS FOR USE  Sit on the edge of your bed if possible, or sit up as far as you can in bed or on a chair. Hold the incentive spirometer in an upright position. Breathe out normally. Place the mouthpiece in your mouth and seal your lips tightly around it. Breathe in slowly and as deeply as possible, raising the piston or the ball toward the top of the column. Hold your breath for 3-5 seconds or for as long as possible. Allow the piston or ball to fall to the bottom of the column. Remove the mouthpiece from your mouth and breathe out normally. Rest for a few seconds and repeat Steps 1 through 7 at least 10 times every 1-2 hours when you are awake. Take your time and take a few normal breaths between deep breaths. The spirometer may include an indicator to show your best effort. Use the indicator as a goal to work toward during each repetition. After each set of 10 deep breaths, practice coughing to be sure your lungs are clear. If you have an incision (the cut made at the time of surgery), support your incision when coughing by placing a pillow or rolled up towels firmly against it. Once you are able to get out of bed, walk around indoors and cough well. You may stop using the incentive spirometer when instructed by your caregiver.  RISKS AND COMPLICATIONS Take your time so you do not  get dizzy or light-headed. If you are in pain, you may need to take or  ask for pain medication before doing incentive spirometry. It is harder to take a deep breath if you are having pain. AFTER USE Rest and breathe slowly and easily. It can be helpful to keep track of a log of your progress. Your caregiver can provide you with a simple table to help with this. If you are using the spirometer at home, follow these instructions: [place] IF:  You are having difficultly using the spirometer. You have trouble using the spirometer as often as instructed. Your pain medication is not giving enough relief while using the spirometer. You develop fever of 100.5 F (38.1 C) or higher. SEEK IMMEDIATE MEDICAL CARE IF:  You cough up bloody sputum that had not been present before. You develop fever of 102 F (38.9 C) or greater. You develop worsening pain at or near the incision site. MAKE SURE YOU:  Understand these instructions. Will watch your condition. Will get help right away if you are not doing well or get worse. Document Released: 04/02/2007 Document Revised: 02/12/2012 Document Reviewed: 06/03/2007 [facility] Patient Information 2014 [employer], [place].   ________________________________________________________________________    ________________________________________________________________________

## 2022-06-30 ENCOUNTER — Encounter (HOSPITAL_COMMUNITY): Payer: Self-pay

## 2022-06-30 ENCOUNTER — Other Ambulatory Visit: Payer: Self-pay

## 2022-06-30 ENCOUNTER — Encounter (HOSPITAL_COMMUNITY)
Admission: RE | Admit: 2022-06-30 | Discharge: 2022-06-30 | Disposition: A | Discharge status: Home / Self Care | Payer: 59 | Source: Ambulatory Visit | Attending: Orthopaedic Surgery | Admitting: Orthopaedic Surgery

## 2022-06-30 VITALS — BP 171/114 | HR 81 | Temp 98.2°F | Ht 70.0 in | Wt 273.0 lb

## 2022-06-30 DIAGNOSIS — M1712 Unilateral primary osteoarthritis, left knee: Secondary | ICD-10-CM | POA: Diagnosis not present

## 2022-06-30 DIAGNOSIS — I10 Essential (primary) hypertension: Secondary | ICD-10-CM

## 2022-06-30 DIAGNOSIS — Z01818 Encounter for other preprocedural examination: Secondary | ICD-10-CM

## 2022-06-30 DIAGNOSIS — E1165 Type 2 diabetes mellitus with hyperglycemia: Secondary | ICD-10-CM | POA: Diagnosis not present

## 2022-06-30 LAB — CBC
HCT: 44.4 % (ref 39.0–52.0)
Hemoglobin: 15.5 g/dL (ref 13.0–17.0)
MCH: 34.8 pg — ABNORMAL HIGH (ref 26.0–34.0)
MCHC: 34.9 g/dL (ref 30.0–36.0)
MCV: 99.6 fL (ref 80.0–100.0)
Platelets: 346 10*3/uL (ref 150–400)
RBC: 4.46 MIL/uL (ref 4.22–5.81)
RDW: 13.7 % (ref 11.5–15.5)
WBC: 5.4 10*3/uL (ref 4.0–10.5)
nRBC: 0 % (ref 0.0–0.2)

## 2022-06-30 LAB — SURGICAL PCR SCREEN
MRSA, PCR: NEGATIVE
Staphylococcus aureus: POSITIVE — AB

## 2022-06-30 LAB — BASIC METABOLIC PANEL
Anion gap: 10 (ref 5–15)
BUN: 19 mg/dL (ref 6–20)
CO2: 24 mmol/L (ref 22–32)
Calcium: 9.6 mg/dL (ref 8.9–10.3)
Chloride: 103 mmol/L (ref 98–111)
Creatinine, Ser: 1.15 mg/dL (ref 0.61–1.24)
GFR, Estimated: >60 mL/min (ref >60)
Glucose, Bld: 155 mg/dL — ABNORMAL HIGH (ref 70–99)
Potassium: 4.9 mmol/L (ref 3.5–5.1)
Sodium: 137 mmol/L (ref 135–145)

## 2022-06-30 LAB — HEMOGLOBIN A1C
Hgb A1c MFr Bld: 6.3 % — ABNORMAL HIGH (ref 4.8–5.6)
Mean Plasma Glucose: 134.11 mg/dL

## 2022-06-30 LAB — GLUCOSE, CAPILLARY: Glucose-Capillary: 191 mg/dL — ABNORMAL HIGH (ref 70–99)

## 2022-06-30 NOTE — Progress Notes (Signed)
PCR: + STAPH °

## 2022-06-30 NOTE — Progress Notes (Signed)
For Short Stay: [place] appointment date: Date of COVID positive in last 35 days:  Bowel Prep reminder:   For Anesthesia: PCP - Dr. Frederico Hamman [place] Cardiologist -   Chest x-ray -  EKG - 12/16/21 Stress Test -  ECHO -  Cardiac Cath -  Pacemaker/ICD device last checked: Pacemaker orders received: Device Rep notified:  Spinal Cord Stimulator:  Sleep Study -  CPAP -   Fasting Blood Sugar -  Checks Blood Sugar _____ times a day Date and result of last Hgb A1c-6.7: 03/29/22  Blood Thinner Instructions: Aspirin Instructions: Last Dose:  Activity level: Can go up a flight of stairs and activities of daily living without stopping and without chest pain and/or shortness of breath   Able to exercise without chest pain and/or shortness of breath   Unable to go up a flight of stairs without chest pain and/or shortness of breath     Anesthesia review: Hx: DIA,HTN  Patient denies shortness of breath, fever, cough and chest pain at PAT appointment   Patient verbalized understanding of instructions that were given to them at the PAT appointment. Patient was also instructed that they will need to review over the PAT instructions again at home before surgery.

## 2022-07-06 DIAGNOSIS — M1712 Unilateral primary osteoarthritis, left knee: Secondary | ICD-10-CM

## 2022-07-06 NOTE — H&P (Signed)
TOTAL KNEE ADMISSION H&P  Patient is being admitted for left total knee arthroplasty.  Subjective:  Chief Complaint:left knee pain.  HPI: Randy Combs, 56 y.o. male, has a history of pain and functional disability in the left knee due to arthritis and has failed non-surgical conservative treatments for greater than 12 weeks to includeNSAID's and/or analgesics, corticosteriod injections, viscosupplementation injections, flexibility and strengthening excercises, use of assistive devices, weight reduction as appropriate, and activity modification.  Onset of symptoms was gradual, starting 5 years ago with gradually worsening course since that time. The patient noted no past surgery on the left knee(s).  Patient currently rates pain in the left knee(s) at 10 out of 10 with activity. Patient has night pain, worsening of pain with activity and weight bearing, pain that interferes with activities of daily living, pain with passive range of motion, crepitus, and joint swelling.  Patient has evidence of subchondral sclerosis, periarticular osteophytes, and joint space narrowing by imaging studies. There is no active infection.  Patient Active Problem List   Diagnosis Date Noted   Unilateral primary osteoarthritis, left knee 07/06/2022   Dip use 07/05/2021   Uncontrolled type 2 diabetes mellitus with hyperglycemia, without long-term current use of insulin ([place]) 06/12/2021   Hiatal hernia 03/09/2016   Anemia, iron deficiency 03/01/2015   Idiopathic chronic gout, multiple sites, without tophus ([place]) 03/01/2015   Essential hypertension 03/04/2014   Past Medical History:  Diagnosis Date   Alcohol abuse, in remission    quit in 2008   Arthritis    Bleeding ulcer    Blood transfusion without reported diagnosis    Glaucoma    Gout of multiple sites 03/01/2015   Hiatal hernia    Hypertension    Uncontrolled type 2 diabetes mellitus, without long-term current use of insulin 06/12/2021    Past  Surgical History:  Procedure Laterality Date   APPENDECTOMY  1970's   HERNIA REPAIR     abdominal    No current facility-administered medications for this encounter.   Current Outpatient Medications  Medication Sig Dispense Refill Last Dose   indomethacin (INDOCIN) 50 MG capsule TAKE 1 CAPSULE BY MOUTH THREE TIMES A DAY AS NEEDED 90 capsule 3    celecoxib (CELEBREX) 200 MG capsule Take 1 capsule (200 mg total) by mouth 2 (two) times daily as needed. (Patient not taking: Reported on 06/22/2022) 60 capsule 1 Not Taking   colchicine 0.6 MG tablet Take 1 tablet (0.6 mg total) by mouth 2 (two) times daily. (Patient not taking: Reported on 06/22/2022) 60 tablet 2 Not Taking   cyclobenzaprine (FLEXERIL) 10 MG tablet Take 1 tablet (10 mg total) by mouth 3 (three) times daily as needed for muscle spasms. 30 tablet 0    metFORMIN (GLUCOPHAGE XR) 500 MG 24 hr tablet Take 2 tablets (1,000 mg total) by mouth daily with breakfast. (Patient not taking: Reported on 03/29/2022) 60 tablet 5    omeprazole (PRILOSEC) 20 MG capsule Take 1 capsule (20 mg total) by mouth daily. (Patient not taking: Reported on 03/29/2022) 30 capsule 11    traMADol (ULTRAM) 50 MG tablet Take 2 tablets (100 mg total) by mouth every 6 (six) hours as needed. 40 tablet 0    Allergies  Allergen Reactions   Aspirin Other (See Comments)    Ulcer    Social History   Tobacco Use   Smoking status: Former    Types: Cigars    Quit date: 02/07/2013    Years since quitting: 9.4  Smokeless tobacco: Current    Types: Snuff  Substance Use Topics   Alcohol use: Yes    Comment: occas.    Family History  Adopted: Yes  Problem Relation Age of Onset   Lung cancer Mother    Healthy Father    Healthy Child    Healthy Child    Colon cancer Neg Hx    Esophageal cancer Neg Hx      Review of Systems  Musculoskeletal:  Positive for gait problem and joint swelling.  All other systems reviewed and are negative.   Objective:  Physical  Exam Vitals reviewed.  Constitutional:      Appearance: Normal appearance. He is obese.  HENT:     Head: Normocephalic and atraumatic.  Eyes:     Extraocular Movements: Extraocular movements intact.     Pupils: Pupils are equal, round, and reactive to light.  Cardiovascular:     Rate and Rhythm: Normal rate and regular rhythm.     Pulses: Normal pulses.  Pulmonary:     Effort: Pulmonary effort is normal.     Breath sounds: Normal breath sounds.  Abdominal:     Palpations: Abdomen is soft.  Musculoskeletal:     Cervical back: Normal range of motion and neck supple.     Left knee: Effusion, bony tenderness and crepitus present. Decreased range of motion. Tenderness present over the medial joint line, lateral joint line and patellar tendon. Abnormal alignment.  Neurological:     Mental Status: He is alert and oriented to person, place, and time.  Psychiatric:        Behavior: Behavior normal.     Vital signs in last 24 hours:    Labs:   Estimated body mass index is 39.17 kg/m as calculated from the following:   Height as of 06/30/22: '5\' 10"'$  (1.778 m).   Weight as of 06/30/22: 123.8 kg.   Imaging Review Plain radiographs demonstrate severe degenerative joint disease of the left knee(s). The overall alignment ismild varus. The bone quality appears to be excellent for age and reported activity level.      Assessment/Plan:  End stage arthritis, left knee   The patient history, physical examination, clinical judgment of the provider and imaging studies are consistent with end stage degenerative joint disease of the left knee(s) and total knee arthroplasty is deemed medically necessary. The treatment options including medical management, injection therapy arthroscopy and arthroplasty were discussed at length. The risks and benefits of total knee arthroplasty were presented and reviewed. The risks due to aseptic loosening, infection, stiffness, patella tracking problems,  thromboembolic complications and other imponderables were discussed. The patient acknowledged the explanation, agreed to proceed with the plan and consent was signed. Patient is being admitted for inpatient treatment for surgery, pain control, PT, OT, prophylactic antibiotics, VTE prophylaxis, progressive ambulation and ADL's and discharge planning. The patient is planning to be discharged home with home health services

## 2022-07-07 ENCOUNTER — Observation Stay (HOSPITAL_COMMUNITY): Payer: 59

## 2022-07-07 ENCOUNTER — Other Ambulatory Visit: Payer: Self-pay

## 2022-07-07 ENCOUNTER — Inpatient Hospital Stay (HOSPITAL_COMMUNITY)
Admission: AD | Admit: 2022-07-07 | Discharge: 2022-07-20 | DRG: 981 | Disposition: A | Discharge status: Home Health | Payer: 59 | Attending: Family Medicine | Admitting: Family Medicine

## 2022-07-07 ENCOUNTER — Ambulatory Visit (HOSPITAL_COMMUNITY): Payer: 59 | Admitting: Anesthesiology

## 2022-07-07 ENCOUNTER — Encounter (HOSPITAL_COMMUNITY): Payer: Self-pay | Admitting: Orthopaedic Surgery

## 2022-07-07 ENCOUNTER — Ambulatory Visit (HOSPITAL_BASED_OUTPATIENT_CLINIC_OR_DEPARTMENT_OTHER): Payer: 59 | Admitting: Anesthesiology

## 2022-07-07 ENCOUNTER — Encounter (HOSPITAL_COMMUNITY)
Admission: AD | Disposition: A | Discharge status: Home Health | Payer: Self-pay | Source: Home / Self Care | Attending: Student

## 2022-07-07 DIAGNOSIS — E876 Hypokalemia: Secondary | ICD-10-CM | POA: Diagnosis not present

## 2022-07-07 DIAGNOSIS — K625 Hemorrhage of anus and rectum: Secondary | ICD-10-CM | POA: Diagnosis not present

## 2022-07-07 DIAGNOSIS — M1712 Unilateral primary osteoarthritis, left knee: Secondary | ICD-10-CM

## 2022-07-07 DIAGNOSIS — N179 Acute kidney failure, unspecified: Secondary | ICD-10-CM | POA: Diagnosis present

## 2022-07-07 DIAGNOSIS — I10 Essential (primary) hypertension: Secondary | ICD-10-CM

## 2022-07-07 DIAGNOSIS — I160 Hypertensive urgency: Secondary | ICD-10-CM

## 2022-07-07 DIAGNOSIS — I959 Hypotension, unspecified: Secondary | ICD-10-CM | POA: Diagnosis not present

## 2022-07-07 DIAGNOSIS — Z87891 Personal history of nicotine dependence: Secondary | ICD-10-CM

## 2022-07-07 DIAGNOSIS — F10939 Alcohol use, unspecified with withdrawal, unspecified: Secondary | ICD-10-CM

## 2022-07-07 DIAGNOSIS — Z6841 Body Mass Index (BMI) 40.0 and over, adult: Secondary | ICD-10-CM

## 2022-07-07 DIAGNOSIS — M109 Gout, unspecified: Secondary | ICD-10-CM | POA: Diagnosis present

## 2022-07-07 DIAGNOSIS — D62 Acute posthemorrhagic anemia: Secondary | ICD-10-CM | POA: Diagnosis not present

## 2022-07-07 DIAGNOSIS — J9601 Acute respiratory failure with hypoxia: Principal | ICD-10-CM | POA: Diagnosis present

## 2022-07-07 DIAGNOSIS — Z886 Allergy status to analgesic agent status: Secondary | ICD-10-CM

## 2022-07-07 DIAGNOSIS — F10139 Alcohol abuse with withdrawal, unspecified: Secondary | ICD-10-CM | POA: Diagnosis present

## 2022-07-07 DIAGNOSIS — K567 Ileus, unspecified: Secondary | ICD-10-CM

## 2022-07-07 DIAGNOSIS — E119 Type 2 diabetes mellitus without complications: Secondary | ICD-10-CM | POA: Diagnosis not present

## 2022-07-07 DIAGNOSIS — E871 Hypo-osmolality and hyponatremia: Secondary | ICD-10-CM | POA: Diagnosis not present

## 2022-07-07 DIAGNOSIS — G9341 Metabolic encephalopathy: Secondary | ICD-10-CM

## 2022-07-07 DIAGNOSIS — I82612 Acute embolism and thrombosis of superficial veins of left upper extremity: Secondary | ICD-10-CM | POA: Diagnosis not present

## 2022-07-07 DIAGNOSIS — G928 Other toxic encephalopathy: Secondary | ICD-10-CM | POA: Diagnosis present

## 2022-07-07 DIAGNOSIS — Z801 Family history of malignant neoplasm of trachea, bronchus and lung: Secondary | ICD-10-CM

## 2022-07-07 DIAGNOSIS — E1165 Type 2 diabetes mellitus with hyperglycemia: Secondary | ICD-10-CM | POA: Diagnosis present

## 2022-07-07 DIAGNOSIS — Z781 Physical restraint status: Secondary | ICD-10-CM

## 2022-07-07 DIAGNOSIS — G4700 Insomnia, unspecified: Secondary | ICD-10-CM | POA: Diagnosis not present

## 2022-07-07 DIAGNOSIS — Z7984 Long term (current) use of oral hypoglycemic drugs: Secondary | ICD-10-CM

## 2022-07-07 DIAGNOSIS — E861 Hypovolemia: Secondary | ICD-10-CM | POA: Diagnosis present

## 2022-07-07 DIAGNOSIS — D7589 Other specified diseases of blood and blood-forming organs: Secondary | ICD-10-CM | POA: Diagnosis present

## 2022-07-07 DIAGNOSIS — D509 Iron deficiency anemia, unspecified: Secondary | ICD-10-CM | POA: Diagnosis present

## 2022-07-07 DIAGNOSIS — Z96652 Presence of left artificial knee joint: Secondary | ICD-10-CM

## 2022-07-07 HISTORY — PX: TOTAL KNEE ARTHROPLASTY: SHX125

## 2022-07-07 LAB — TYPE AND SCREEN
ABO/RH(D): O POS
Antibody Screen: NEGATIVE

## 2022-07-07 LAB — ABO/RH: ABO/RH(D): O POS

## 2022-07-07 LAB — GLUCOSE, CAPILLARY: Glucose-Capillary: 179 mg/dL — ABNORMAL HIGH (ref 70–99)

## 2022-07-07 SURGERY — ARTHROPLASTY, KNEE, TOTAL
Anesthesia: Spinal | Site: Knee | Laterality: Left

## 2022-07-07 MED ORDER — ONDANSETRON HCL 4 MG/2ML IJ SOLN: 4.0000 mg | Freq: Once | INTRAMUSCULAR | Status: DC | PRN

## 2022-07-07 MED ORDER — PROPOFOL 1000 MG/100ML IV EMUL
INTRAVENOUS | Status: AC
  Filled 2022-07-07: qty 100

## 2022-07-07 MED ORDER — HYDROMORPHONE HCL 1 MG/ML IJ SOLN
INTRAMUSCULAR | Status: AC
  Administered 2022-07-07: 0.5 mg via INTRAVENOUS
  Filled 2022-07-07: qty 1

## 2022-07-07 MED ORDER — POVIDONE-IODINE 10 % EX SWAB
2.0000 | Freq: Once | CUTANEOUS | Status: AC
  Administered 2022-07-07: 2 via TOPICAL

## 2022-07-07 MED ORDER — DEXAMETHASONE SODIUM PHOSPHATE 10 MG/ML IJ SOLN
INTRAMUSCULAR | Status: DC | PRN
  Administered 2022-07-07: 10 mg via INTRAVENOUS

## 2022-07-07 MED ORDER — OXYCODONE HCL 5 MG PO TABS
10.0000 mg | ORAL_TABLET | ORAL | Status: DC | PRN
  Administered 2022-07-07 – 2022-07-09 (×7): 15 mg via ORAL
  Filled 2022-07-07 (×7): qty 3

## 2022-07-07 MED ORDER — HYDROMORPHONE HCL 1 MG/ML IJ SOLN
0.5000 mg | INTRAMUSCULAR | Status: DC | PRN
  Administered 2022-07-07 – 2022-07-09 (×7): 1 mg via INTRAVENOUS
  Filled 2022-07-07 (×6): qty 1

## 2022-07-07 MED ORDER — ONDANSETRON HCL 4 MG/2ML IJ SOLN
INTRAMUSCULAR | Status: AC
  Filled 2022-07-07: qty 2

## 2022-07-07 MED ORDER — HYDROMORPHONE HCL 1 MG/ML IJ SOLN
0.2500 mg | INTRAMUSCULAR | Status: DC | PRN
  Administered 2022-07-07 (×2): 0.5 mg via INTRAVENOUS
  Administered 2022-07-07 (×2): 0.25 mg via INTRAVENOUS

## 2022-07-07 MED ORDER — ORAL CARE MOUTH RINSE: 15.0000 mL | Freq: Once | OROMUCOSAL | Status: AC

## 2022-07-07 MED ORDER — CEFAZOLIN SODIUM-DEXTROSE 2-4 GM/100ML-% IV SOLN
2.0000 g | Freq: Four times a day (QID) | INTRAVENOUS | Status: AC
  Administered 2022-07-07 (×2): 2 g via INTRAVENOUS
  Filled 2022-07-07 (×2): qty 100

## 2022-07-07 MED ORDER — METHOCARBAMOL 500 MG IVPB - SIMPLE MED
INTRAVENOUS | Status: AC
  Administered 2022-07-07: 500 mg via INTRAVENOUS
  Filled 2022-07-07: qty 55

## 2022-07-07 MED ORDER — PROPOFOL 10 MG/ML IV BOLUS
INTRAVENOUS | Status: DC | PRN
  Administered 2022-07-07: 50 mg via INTRAVENOUS
  Administered 2022-07-07: 200 mg via INTRAVENOUS

## 2022-07-07 MED ORDER — METHOCARBAMOL 500 MG IVPB - SIMPLE MED: 500.0000 mg | Freq: Four times a day (QID) | INTRAVENOUS | Status: DC | PRN

## 2022-07-07 MED ORDER — DEXAMETHASONE SODIUM PHOSPHATE 10 MG/ML IJ SOLN
INTRAMUSCULAR | Status: AC
  Filled 2022-07-07: qty 1

## 2022-07-07 MED ORDER — TRANEXAMIC ACID-NACL 1000-0.7 MG/100ML-% IV SOLN
1000.0000 mg | INTRAVENOUS | Status: AC
  Administered 2022-07-07: 1000 mg via INTRAVENOUS
  Filled 2022-07-07: qty 100

## 2022-07-07 MED ORDER — FENTANYL CITRATE PF 50 MCG/ML IJ SOSY
50.0000 ug | PREFILLED_SYRINGE | INTRAMUSCULAR | Status: DC
  Administered 2022-07-07: 100 ug via INTRAVENOUS
  Filled 2022-07-07: qty 2

## 2022-07-07 MED ORDER — MIDAZOLAM HCL 2 MG/2ML IJ SOLN
1.0000 mg | INTRAMUSCULAR | Status: DC
  Administered 2022-07-07: 2 mg via INTRAVENOUS
  Filled 2022-07-07: qty 2

## 2022-07-07 MED ORDER — KETAMINE HCL 10 MG/ML IJ SOLN
INTRAMUSCULAR | Status: AC
  Filled 2022-07-07: qty 1

## 2022-07-07 MED ORDER — GABAPENTIN 100 MG PO CAPS
100.0000 mg | ORAL_CAPSULE | Freq: Three times a day (TID) | ORAL | Status: DC
  Administered 2022-07-07 – 2022-07-09 (×8): 100 mg via ORAL
  Filled 2022-07-07 (×9): qty 1

## 2022-07-07 MED ORDER — METHOCARBAMOL 500 MG PO TABS
500.0000 mg | ORAL_TABLET | Freq: Four times a day (QID) | ORAL | Status: DC | PRN
Start: 2022-07-07 — End: 2022-07-11
  Administered 2022-07-07 – 2022-07-09 (×7): 500 mg via ORAL
  Filled 2022-07-07 (×7): qty 1

## 2022-07-07 MED ORDER — ONDANSETRON HCL 4 MG PO TABS
4.0000 mg | ORAL_TABLET | Freq: Four times a day (QID) | ORAL | Status: DC | PRN
Start: 2022-07-07 — End: 2022-07-11

## 2022-07-07 MED ORDER — ACETAMINOPHEN 325 MG PO TABS
325.0000 mg | ORAL_TABLET | Freq: Four times a day (QID) | ORAL | Status: DC | PRN
  Administered 2022-07-08 – 2022-07-09 (×2): 650 mg via ORAL
  Filled 2022-07-07 (×2): qty 2

## 2022-07-07 MED ORDER — PHENYLEPHRINE 80 MCG/ML (10ML) SYRINGE FOR IV PUSH (FOR BLOOD PRESSURE SUPPORT)
PREFILLED_SYRINGE | INTRAVENOUS | Status: DC | PRN
  Administered 2022-07-07 (×3): 80 ug via INTRAVENOUS

## 2022-07-07 MED ORDER — SODIUM CHLORIDE 0.9 % IV SOLN: INTRAVENOUS | Status: DC

## 2022-07-07 MED ORDER — POLYETHYLENE GLYCOL 3350 17 G PO PACK
17.0000 g | PACK | Freq: Every day | ORAL | Status: DC | PRN
  Administered 2022-07-09: 17 g via ORAL
  Filled 2022-07-07: qty 1

## 2022-07-07 MED ORDER — OXYCODONE HCL 5 MG PO TABS
ORAL_TABLET | ORAL | Status: AC
  Filled 2022-07-07: qty 1

## 2022-07-07 MED ORDER — BUPIVACAINE-EPINEPHRINE (PF) 0.25% -1:200000 IJ SOLN
INTRAMUSCULAR | Status: AC
  Filled 2022-07-07: qty 30

## 2022-07-07 MED ORDER — ONDANSETRON HCL 4 MG/2ML IJ SOLN
4.0000 mg | Freq: Four times a day (QID) | INTRAMUSCULAR | Status: DC | PRN
  Administered 2022-07-09: 4 mg via INTRAVENOUS
  Filled 2022-07-07: qty 2

## 2022-07-07 MED ORDER — ONDANSETRON HCL 4 MG/2ML IJ SOLN
INTRAMUSCULAR | Status: DC | PRN
  Administered 2022-07-07: 4 mg via INTRAVENOUS

## 2022-07-07 MED ORDER — HYDROMORPHONE HCL 1 MG/ML IJ SOLN
INTRAMUSCULAR | Status: AC
  Filled 2022-07-07: qty 1

## 2022-07-07 MED ORDER — 0.9 % SODIUM CHLORIDE (POUR BTL) OPTIME
TOPICAL | Status: DC | PRN
  Administered 2022-07-07: 1000 mL

## 2022-07-07 MED ORDER — ASPIRIN 81 MG PO CHEW
81.0000 mg | CHEWABLE_TABLET | Freq: Two times a day (BID) | ORAL | Status: DC
  Administered 2022-07-07 – 2022-07-09 (×5): 81 mg via ORAL
  Filled 2022-07-07 (×6): qty 1

## 2022-07-07 MED ORDER — FENTANYL CITRATE (PF) 250 MCG/5ML IJ SOLN
INTRAMUSCULAR | Status: AC
  Filled 2022-07-07: qty 5

## 2022-07-07 MED ORDER — CHLORHEXIDINE GLUCONATE 0.12 % MT SOLN
15.0000 mL | Freq: Once | OROMUCOSAL | Status: AC
  Administered 2022-07-07: 15 mL via OROMUCOSAL

## 2022-07-07 MED ORDER — PHENOL 1.4 % MT LIQD: 1.0000 | OROMUCOSAL | Status: DC | PRN

## 2022-07-07 MED ORDER — MENTHOL 3 MG MT LOZG: 1.0000 | LOZENGE | OROMUCOSAL | Status: DC | PRN

## 2022-07-07 MED ORDER — PANTOPRAZOLE SODIUM 40 MG PO TBEC
40.0000 mg | DELAYED_RELEASE_TABLET | Freq: Every day | ORAL | Status: DC
Start: 2022-07-07 — End: 2022-07-10
  Administered 2022-07-07 – 2022-07-09 (×3): 40 mg via ORAL
  Filled 2022-07-07 (×3): qty 1

## 2022-07-07 MED ORDER — DOCUSATE SODIUM 100 MG PO CAPS
100.0000 mg | ORAL_CAPSULE | Freq: Two times a day (BID) | ORAL | Status: DC
Start: 2022-07-07 — End: 2022-07-10
  Administered 2022-07-07 – 2022-07-10 (×5): 100 mg via ORAL
  Filled 2022-07-07 (×5): qty 1

## 2022-07-07 MED ORDER — ALUM & MAG HYDROXIDE-SIMETH 200-200-20 MG/5ML PO SUSP
30.0000 mL | ORAL | Status: DC | PRN
Start: 2022-07-07 — End: 2022-07-08

## 2022-07-07 MED ORDER — HYDROMORPHONE HCL 2 MG/ML IJ SOLN
INTRAMUSCULAR | Status: AC
  Filled 2022-07-07: qty 1

## 2022-07-07 MED ORDER — ACETAMINOPHEN 10 MG/ML IV SOLN: 1000.0000 mg | Freq: Once | INTRAVENOUS | Status: DC | PRN

## 2022-07-07 MED ORDER — BUPIVACAINE-EPINEPHRINE 0.25% -1:200000 IJ SOLN
INTRAMUSCULAR | Status: DC | PRN
  Administered 2022-07-07: 30 mL

## 2022-07-07 MED ORDER — CEFAZOLIN IN SODIUM CHLORIDE 3-0.9 GM/100ML-% IV SOLN
3.0000 g | INTRAVENOUS | Status: AC
  Administered 2022-07-07: 3 g via INTRAVENOUS
  Filled 2022-07-07: qty 100

## 2022-07-07 MED ORDER — KETAMINE HCL 10 MG/ML IJ SOLN
INTRAMUSCULAR | Status: DC | PRN
  Administered 2022-07-07: 50 mg via INTRAVENOUS

## 2022-07-07 MED ORDER — METOCLOPRAMIDE HCL 5 MG PO TABS
5.0000 mg | ORAL_TABLET | Freq: Three times a day (TID) | ORAL | Status: DC | PRN
  Administered 2022-07-08: 10 mg via ORAL
  Filled 2022-07-07: qty 2

## 2022-07-07 MED ORDER — FENTANYL CITRATE (PF) 250 MCG/5ML IJ SOLN
INTRAMUSCULAR | Status: DC | PRN
  Administered 2022-07-07: 100 ug via INTRAVENOUS

## 2022-07-07 MED ORDER — HYDROMORPHONE HCL 1 MG/ML IJ SOLN
INTRAMUSCULAR | Status: DC | PRN
  Administered 2022-07-07: 1 mg via INTRAVENOUS

## 2022-07-07 MED ORDER — METOCLOPRAMIDE HCL 5 MG/ML IJ SOLN
5.0000 mg | Freq: Three times a day (TID) | INTRAMUSCULAR | Status: DC | PRN
  Administered 2022-07-08 – 2022-07-10 (×2): 10 mg via INTRAVENOUS
  Filled 2022-07-07: qty 2

## 2022-07-07 MED ORDER — DIPHENHYDRAMINE HCL 12.5 MG/5ML PO ELIX: 12.5000 mg | ORAL_SOLUTION | ORAL | Status: DC | PRN

## 2022-07-07 MED ORDER — LACTATED RINGERS IV SOLN: INTRAVENOUS | Status: DC

## 2022-07-07 MED ORDER — SODIUM CHLORIDE 0.9 % IR SOLN
Status: DC | PRN
  Administered 2022-07-07: 1000 mL

## 2022-07-07 MED ORDER — ROPIVACAINE HCL 5 MG/ML IJ SOLN
INTRAMUSCULAR | Status: DC | PRN
  Administered 2022-07-07: 20 mL via PERINEURAL

## 2022-07-07 MED ORDER — ACETAMINOPHEN 10 MG/ML IV SOLN
INTRAVENOUS | Status: AC
  Administered 2022-07-07: 1000 mg via INTRAVENOUS
  Filled 2022-07-07: qty 100

## 2022-07-07 MED ORDER — OXYCODONE HCL 5 MG PO TABS
5.0000 mg | ORAL_TABLET | ORAL | Status: DC | PRN
  Administered 2022-07-07 (×2): 10 mg via ORAL
  Filled 2022-07-07 (×2): qty 2

## 2022-07-07 MED ORDER — OXYCODONE HCL 5 MG PO TABS
5.0000 mg | ORAL_TABLET | Freq: Once | ORAL | Status: AC | PRN
  Administered 2022-07-07: 5 mg via ORAL

## 2022-07-07 MED ORDER — OXYCODONE HCL 5 MG/5ML PO SOLN: 5.0000 mg | Freq: Once | ORAL | Status: AC | PRN

## 2022-07-07 SURGICAL SUPPLY — 61 items
BAG COUNTER SPONGE SURGICOUNT (BAG) IMPLANT
BAG ZIPLOCK 12X15 (MISCELLANEOUS) ×2 IMPLANT
BENZOIN TINCTURE PRP APPL 2/3 (GAUZE/BANDAGES/DRESSINGS) IMPLANT
BLADE SAG 18X100X1.27 (BLADE) ×2 IMPLANT
BLADE SURG SZ10 CARB STEEL (BLADE) ×4 IMPLANT
BNDG ELASTIC 6X5.8 VLCR STR LF (GAUZE/BANDAGES/DRESSINGS) ×3 IMPLANT
BOWL SMART MIX CTS (DISPOSABLE) IMPLANT
COMP FEM PS KNEE STD 10 LT (Knees) ×2 IMPLANT
COMP PATELLAR 10X35 METAL (Joint) ×2 IMPLANT
COMP TIB PS G 0D LT (Joint) ×2 IMPLANT
COMPONENT FEM PS KN STD 10 LT (Knees) IMPLANT
COMPONENT PATELLAR 10X35 METAL (Joint) IMPLANT
COMPONET TIB PS G 0D LT (Joint) IMPLANT
COOLER ICEMAN CLASSIC (MISCELLANEOUS) ×2 IMPLANT
COVER SURGICAL LIGHT HANDLE (MISCELLANEOUS) ×2 IMPLANT
CUFF TOURN SGL QUICK 34 (TOURNIQUET CUFF) ×2
CUFF TRNQT CYL 34X4.125X (TOURNIQUET CUFF) ×1 IMPLANT
DRAPE INCISE IOBAN 66X45 STRL (DRAPES) ×2 IMPLANT
DRAPE U-SHAPE 47X51 STRL (DRAPES) ×2 IMPLANT
DRSG PAD ABDOMINAL 8X10 ST (GAUZE/BANDAGES/DRESSINGS) ×4 IMPLANT
DURAPREP 26ML APPLICATOR (WOUND CARE) ×2 IMPLANT
ELECT BLADE TIP CTD 4 INCH (ELECTRODE) ×2 IMPLANT
ELECT REM PT RETURN 15FT ADLT (MISCELLANEOUS) ×2 IMPLANT
GAUZE PAD ABD 8X10 STRL (GAUZE/BANDAGES/DRESSINGS) ×1 IMPLANT
GAUZE SPONGE 4X4 12PLY STRL (GAUZE/BANDAGES/DRESSINGS) ×2 IMPLANT
GAUZE XEROFORM 1X8 LF (GAUZE/BANDAGES/DRESSINGS) ×1 IMPLANT
GLOVE BIO SURGEON STRL SZ7.5 (GLOVE) ×2 IMPLANT
GLOVE BIOGEL PI IND STRL 8 (GLOVE) ×2 IMPLANT
GLOVE BIOGEL PI INDICATOR 8 (GLOVE) ×4
GLOVE ECLIPSE 8.0 STRL XLNG CF (GLOVE) ×2 IMPLANT
GOWN STRL REUS W/ TWL XL LVL3 (GOWN DISPOSABLE) ×2 IMPLANT
GOWN STRL REUS W/TWL XL LVL3 (GOWN DISPOSABLE) ×4
HANDPIECE INTERPULSE COAX TIP (DISPOSABLE) ×2
HDLS TROCR DRIL PIN KNEE 75 (PIN) ×2
HOLDER FOLEY CATH W/STRAP (MISCELLANEOUS) IMPLANT
IMMOBILIZER KNEE 20 (SOFTGOODS) ×2
IMMOBILIZER KNEE 20 THIGH 36 (SOFTGOODS) ×1 IMPLANT
INSERT FIXED AS PERS SZ8-11 LT (Insert) ×1 IMPLANT
KIT TURNOVER KIT A (KITS) IMPLANT
NS IRRIG 1000ML POUR BTL (IV SOLUTION) ×2 IMPLANT
PACK TOTAL KNEE CUSTOM (KITS) ×2 IMPLANT
PAD COLD SHLDR WRAP-ON (PAD) ×2 IMPLANT
PADDING CAST COTTON 6X4 STRL (CAST SUPPLIES) ×4 IMPLANT
PADDING CAST SYN 6 (CAST SUPPLIES) ×1
PADDING CAST SYNTHETIC 6X4 NS (CAST SUPPLIES) IMPLANT
PIN DRILL HDLS TROCAR 75 4PK (PIN) IMPLANT
PROTECTOR NERVE ULNAR (MISCELLANEOUS) ×2 IMPLANT
SCREW FEMALE HEX FIX 25X2.5 (ORTHOPEDIC DISPOSABLE SUPPLIES) ×1 IMPLANT
SET HNDPC FAN SPRY TIP SCT (DISPOSABLE) ×1 IMPLANT
SET PAD KNEE POSITIONER (MISCELLANEOUS) ×2 IMPLANT
SPIKE FLUID TRANSFER (MISCELLANEOUS) IMPLANT
STAPLER VISISTAT 35W (STAPLE) ×1 IMPLANT
STRIP CLOSURE SKIN 1/2X4 (GAUZE/BANDAGES/DRESSINGS) IMPLANT
SUT MNCRL AB 4-0 PS2 18 (SUTURE) IMPLANT
SUT VIC AB 0 CT1 27 (SUTURE) ×2
SUT VIC AB 0 CT1 27XBRD ANTBC (SUTURE) ×1 IMPLANT
SUT VIC AB 1 CT1 36 (SUTURE) ×4 IMPLANT
SUT VIC AB 2-0 CT1 27 (SUTURE) ×4
SUT VIC AB 2-0 CT1 TAPERPNT 27 (SUTURE) ×2 IMPLANT
TRAY FOLEY MTR SLVR 16FR STAT (SET/KITS/TRAYS/PACK) IMPLANT
WATER STERILE IRR 1000ML POUR (IV SOLUTION) ×4 IMPLANT

## 2022-07-07 NOTE — Anesthesia Procedure Notes (Signed)
Anesthesia Regional Block: Adductor canal block   Pre-Anesthetic Checklist: , timeout performed,  Correct Patient, Correct Site, Correct Laterality,  Correct Procedure, Correct Position, site marked,  Risks and benefits discussed,  Surgical consent,  Pre-op evaluation,  At surgeon's request and post-op pain management  Laterality: Left  Prep: chloraprep       Needles:  Injection technique: Single-shot  Needle Type: Echogenic Needle     Needle Length: 9cm      Additional Needles:   Procedures:,,,, ultrasound used (permanent image in chart),,    Narrative:  Start time: 07/07/2022 8:00 AM End time: 07/07/2022 8:09 AM Injection made incrementally with aspirations every 5 mL.  Performed by: Personally  Anesthesiologist: Myrtie Soman, MD  Additional Notes: Patient tolerated the procedure well without complications

## 2022-07-07 NOTE — Brief Op Note (Signed)
07/07/2022  9:46 AM  PATIENT:  [name]  56 y.o. male  PRE-OPERATIVE DIAGNOSIS:  OSTEOARTHRITIS LEFT KNEE  POST-OPERATIVE DIAGNOSIS:  OSTEOARTHRITIS LEFT KNEE  PROCEDURE:  Procedure(s): LEFT TOTAL KNEE ARTHROPLASTY (Left)  SURGEON:  Surgeon(s) and Role:    Mcarthur Rossetti, MD - Primary  PHYSICIAN ASSISTANT:  Benita Stabile, PA-C  ANESTHESIA:   local, regional, spinal, and general  COUNTS:  YES  TOURNIQUET:   Total Tourniquet Time Documented: Thigh (Left) - 45 minutes Total: Thigh (Left) - 45 minutes   DICTATION: .Other Dictation: Dictation Number [id number]  PLAN OF CARE: Admit for overnight observation  PATIENT DISPOSITION:  PACU - hemodynamically stable.   Delay start of Pharmacological VTE agent (>24hrs) due to surgical blood loss or risk of bleeding: no

## 2022-07-07 NOTE — Plan of Care (Signed)

## 2022-07-07 NOTE — Anesthesia Procedure Notes (Signed)
Anesthesia Procedure Image    

## 2022-07-07 NOTE — Anesthesia Procedure Notes (Signed)
Procedure Name: LMA Insertion Date/Time: 07/07/2022 8:33 AM  Performed by: [place] Manus Rudd, CRNAPre-anesthesia Checklist: Patient identified, Emergency Drugs available, Suction available and Patient being monitored Patient Re-evaluated:Patient Re-evaluated prior to induction Oxygen Delivery Method: Circle system utilized Preoxygenation: Pre-oxygenation with 100% oxygen Induction Type: IV induction Ventilation: Mask ventilation without difficulty LMA: LMA inserted LMA Size: 5.0 Number of attempts: 1 Airway Equipment and Method: Bite block Placement Confirmation: positive ETCO2 Tube secured with: Tape Dental Injury: Teeth and Oropharynx as per pre-operative assessment

## 2022-07-07 NOTE — Evaluation (Signed)
Physical Therapy Evaluation Patient Details Name: Randy Combs MRN: [id number] DOB: 08-Oct-1966 Today's Date: 07/07/2022  History of Present Illness  56 yo male S/P LTKA 07/07/22.Marland Kitchen PMH: gout, DM, glaucoma,  Clinical Impression  Patient initially in 8/10 pain in the  " knee cap." Dr. Ninfa Linden in and informed patient of a partial patella replacement.   Patient  did mobilize and ambulated x 50' using RW Patient pleased to ambulate and reported decreased pain, with further reduction after KI was removed. Ice machine pad  in place and cooling L knee.  Patient should progress to Dc tomorrow.  Pt admitted with above diagnosis.  Pt currently with functional limitations due to the deficits listed below (see PT Problem List). Pt will benefit from skilled PT to increase their independence and safety with mobility to allow discharge to the venue listed below.      Recommendations for follow up therapy are one component of a multi-disciplinary discharge planning process, led by the attending physician.  Recommendations may be updated based on patient status, additional functional criteria and insurance authorization.  Follow Up Recommendations Follow physician's recommendations for discharge plan and follow up therapies      Assistance Recommended at Discharge Intermittent Supervision/Assistance  Patient can return home with the following  A little help with walking and/or transfers;Assistance with cooking/housework;Assist for transportation;Help with stairs or ramp for entrance;A little help with bathing/dressing/bathroom    Equipment Recommendations None recommended by PT  Recommendations for Other Services       Functional Status Assessment Patient has had a recent decline in their functional status and demonstrates the ability to make significant improvements in function in a reasonable and predictable amount of time.     Precautions / Restrictions Precautions Precautions: Fall;Knee Required  Braces or Orthoses: Knee Immobilizer - Left Knee Immobilizer - Left: Discontinue once straight leg raise with < 10 degree lag      Mobility  Bed Mobility Overal bed mobility: Needs Assistance Bed Mobility: Supine to Sit, Sit to Supine     Supine to sit: Min assist, HOB elevated Sit to supine: Min assist, HOB elevated   General bed mobility comments: support Left leg to floor and back onto bed    Transfers Overall transfer level: Needs assistance Equipment used: Rolling walker (2 wheels) Transfers: Sit to/from Stand Sit to Stand: Min assist, From elevated surface           General transfer comment: cues for hand  and LLE position    Ambulation/Gait Ambulation/Gait assistance: Min assist Gait Distance (Feet): 40 Feet Assistive device: Rolling walker (2 wheels) Gait Pattern/deviations: Step-to pattern, Step-through pattern Gait velocity: decr     General Gait Details: initially  reported pain with WB on LLE, less pain with distance  Stairs            Wheelchair Mobility    Modified Rankin (Stroke Patients Only)       Balance Overall balance assessment: Mild deficits observed, not formally tested                                           Pertinent Vitals/Pain Pain Assessment Pain Assessment: 0-10 Pain Score: 8  Pain Location: left knee cap before getting up to ambulate and then removing the KI, reported no pain at rest, Dr. Ninfa Linden reported to have replaced part of the patella Pain Descriptors / Indicators: Grimacing,  Discomfort Pain Intervention(s): Limited activity within patient's tolerance, Monitored during session, Premedicated before session, Ice applied    Home Living Family/patient expects to be discharged to:: Private residence Living Arrangements: Spouse/significant other Available Help at Discharge: Family;Available 24 hours/day Type of Home: House Home Access: Stairs to enter Entrance Stairs-Rails: Left Entrance  Stairs-Number of Steps: 2 Alternate Level Stairs-Number of Steps: slit with 1 step to BR-says he can hold onto things   Home Equipment: Conservation officer, nature (2 wheels);Crutches;Grab bars - tub/shower;Toilet riser      Prior Function Prior Level of Function : Independent/Modified Independent                     Hand Dominance   Dominant Hand: Right    Extremity/Trunk Assessment   Upper Extremity Assessment Upper Extremity Assessment: Overall WFL for tasks assessed    Lower Extremity Assessment Lower Extremity Assessment: LLE deficits/detail LLE Deficits / Details: able to SLR a few inches, Knee flexed to ~ 40 in supine using belt to assist.    Cervical / Trunk Assessment Cervical / Trunk Assessment: Normal  Communication   Communication: No difficulties  Cognition Arousal/Alertness: Awake/alert Behavior During Therapy: WFL for tasks assessed/performed Overall Cognitive Status: Within Functional Limits for tasks assessed                                          General Comments      Exercises Total Joint Exercises Ankle Circles/Pumps: AROM, Both, 10 reps Heel Slides: AROM, 5 reps, Supine, Left   Assessment/Plan    PT Assessment Patient needs continued PT services  PT Problem List Decreased strength;Decreased mobility;Decreased safety awareness;Decreased knowledge of precautions;Decreased activity tolerance;Decreased cognition;Decreased knowledge of use of DME;Pain       PT Treatment Interventions DME instruction;Therapeutic activities;Gait training;Therapeutic exercise;Patient/family education;Functional mobility training    PT Goals (Current goals can be found in the Care Plan section)  Acute Rehab PT Goals Patient Stated Goal: to not have pain PT Goal Formulation: With patient/family Time For Goal Achievement: 07/14/22 Potential to Achieve Goals: Good    Frequency 7X/week     Co-evaluation               AM-PAC PT "6 Clicks"  Mobility  Outcome Measure Help needed turning from your back to your side while in a flat bed without using bedrails?: A Little Help needed moving from lying on your back to sitting on the side of a flat bed without using bedrails?: A Little Help needed moving to and from a bed to a chair (including a wheelchair)?: A Little Help needed standing up from a chair using your arms (e.g., wheelchair or bedside chair)?: A Little Help needed to walk in hospital room?: A Little Help needed climbing 3-5 steps with a railing? : A Lot 6 Click Score: 17    End of Session Equipment Utilized During Treatment: Gait belt;Left knee immobilizer Activity Tolerance: Patient tolerated treatment well Patient left: in bed;with call bell/phone within reach;with family/visitor present Nurse Communication: Mobility status PT Visit Diagnosis: Unsteadiness on feet (R26.81);Muscle weakness (generalized) (M62.81)    Time: 3557-3220 PT Time Calculation (min) (ACUTE ONLY): 31 min   Charges:   PT Evaluation $PT Eval Low Complexity: 1 Low PT Treatments $Gait Training: 8-22 mins        [place] Office [phone] Weekend [phone]   [place],  Shella Maxim 07/07/2022, 4:33 PM

## 2022-07-07 NOTE — Progress Notes (Signed)
Assisted Dr. Kalman Shan with left, adductor canal block. Side rails up, monitors on throughout procedure. See vital signs in flow sheet. Tolerated Procedure well.

## 2022-07-07 NOTE — Anesthesia Postprocedure Evaluation (Signed)
Anesthesia Post Note  Patient: Randy Combs  Procedure(s) Performed: LEFT TOTAL KNEE ARTHROPLASTY (Left: Knee)     Patient location during evaluation: PACU Anesthesia Type: General Level of consciousness: awake and alert Pain management: pain level controlled Vital Signs Assessment: post-procedure vital signs reviewed and stable Respiratory status: spontaneous breathing, nonlabored ventilation, respiratory function stable and patient connected to nasal cannula oxygen Cardiovascular status: blood pressure returned to baseline and stable Postop Assessment: no apparent nausea or vomiting Anesthetic complications: no   No notable events documented.  Last Vitals:  Vitals:   07/07/22 1045 07/07/22 1115  BP: 131/82 (!) 126/95  Pulse: 75 72  Resp: 13 18  Temp: 36.6 C   SpO2: 92% 96%    Last Pain:  Vitals:   07/07/22 1120  TempSrc:   PainSc: 6                  Zarah Carbon S

## 2022-07-07 NOTE — Anesthesia Preprocedure Evaluation (Signed)
Anesthesia Evaluation  Patient identified by MRN, date of birth, ID band Patient awake    Reviewed: Allergy & Precautions, NPO status , Patient's Chart, lab work & pertinent test results  Airway Mallampati: II  TM Distance: >3 FB Neck ROM: Full    Dental no notable dental hx.    Pulmonary neg pulmonary ROS, former smoker,    Pulmonary exam normal breath sounds clear to auscultation       Cardiovascular hypertension, Pt. on medications Normal cardiovascular exam Rhythm:Regular Rate:Normal     Neuro/Psych negative neurological ROS  negative psych ROS   GI/Hepatic Neg liver ROS, hiatal hernia,   Endo/Other  diabetes, Type 2Morbid obesity  Renal/GU negative Renal ROS  negative genitourinary   Musculoskeletal  (+) Arthritis , Osteoarthritis,    Abdominal   Peds negative pediatric ROS (+)  Hematology negative hematology ROS (+)   Anesthesia Other Findings   Reproductive/Obstetrics negative OB ROS                             Anesthesia Physical Anesthesia Plan  ASA: 3  Anesthesia Plan: Spinal   Post-op Pain Management: Regional block*   Induction: Intravenous  PONV Risk Score and Plan: 1 and Propofol infusion, Treatment may vary due to age or medical condition and Ondansetron  Airway Management Planned: Simple Face Mask  Additional Equipment:   Intra-op Plan:   Post-operative Plan:   Informed Consent: I have reviewed the patients History and Physical, chart, labs and discussed the procedure including the risks, benefits and alternatives for the proposed anesthesia with the patient or authorized representative who has indicated his/her understanding and acceptance.     Dental advisory given  Plan Discussed with: CRNA and Surgeon  Anesthesia Plan Comments:         Anesthesia Quick Evaluation

## 2022-07-07 NOTE — Transfer of Care (Signed)
Immediate Anesthesia Transfer of Care Note  Patient: Randy Combs  Procedure(s) Performed: LEFT TOTAL KNEE ARTHROPLASTY (Left: Knee)  Patient Location: PACU  Anesthesia Type:GA combined with regional for post-op pain  Level of Consciousness: awake and drowsy  Airway & Oxygen Therapy: Patient Spontanous Breathing and Patient connected to face mask oxygen  Post-op Assessment: Report given to RN and Post -op Vital signs reviewed and stable  Post vital signs: Reviewed and stable  Last Vitals:  Vitals Value Taken Time  BP 147/83 07/07/22 1015  Temp    Pulse 73 07/07/22 1017  Resp 13 07/07/22 1017  SpO2 94 % 07/07/22 1017  Vitals shown include unvalidated device data.  Last Pain:  Vitals:   07/07/22 0647  TempSrc: Oral         Complications: No notable events documented.

## 2022-07-07 NOTE — Op Note (Signed)
NAME: Randy Combs, Randy Combs RECORD NO: [id number] ACCOUNT NO: [id number] DATE OF BIRTH: [birth year] FACILITY: [facility] LOCATION: [facility] PHYSICIAN: Lind Guest. Ninfa Linden, MD  Operative Report   DATE OF PROCEDURE: 07/07/2022  PREOPERATIVE DIAGNOSES:  Primary osteoarthritis and degenerative joint disease of left knee.  POSTOPERATIVE DIAGNOSIS:  Primary osteoarthritis and degenerative joint disease of left knee.  PROCEDURE:  Left total knee arthroplasty.  IMPLANTS:  Biomet/Zimmer Persona press-fit knee system with size 10 CR standard femur, size G left tibial tray, 12 mm thickness medial congruent, left polyethylene insert, 35 mm press-fit patellar button.  SURGEON:  Lind Guest. Ninfa Linden, MD  ASSISTANT:  Benita Stabile, PA-C.  ANESTHESIA:   1.  Left lower extremity adductor canal block. 2.  Attempted spinal. 3.  General.  ANTIBIOTICS:  3 g IV Ancef.  BLOOD LOSS:  Less than 100 mL  TOURNIQUET TIME:  Less than 1 hour.  COMPLICATIONS:  None.  INDICATIONS:  The patient is a 56 year old gentleman well known to me.  He is moderately to morbidly obese with a BMI of 38, but has been dealing with severe left knee pain for many years now.  We have followed him for many years now.  He has worked on  weight loss and activity modification.  He has had anti-inflammatories.  He has had numerous injections in his knee and has worked on conservative treatment.  At this point, his left knee pain is daily and is detrimentally affecting his mobility, his  quality of life and his activities of daily living to the point he does wish to proceed with total knee arthroplasty and we agree with this as well.  We talked in length in detail about the risk of acute blood loss anemia, nerve or vessel injury,  fracture, infection, DVT, implant failure and skin and soft tissue issues.  We talked about our goals being decreased pain, improve mobility and overall improve quality of life.  DESCRIPTION OF  PROCEDURE:  After informed consent was obtained, appropriate left knee was marked.  Anesthesia was obtained, an adductor canal block of the left lower extremity in the holding room.  He was then brought to the operating room and sat up on  the operating table where spinal anesthesia was attempted.  It was unsuccessful, so he was laid in supine position and general anesthesia was obtained.  A nonsterile tourniquet was placed around his upper left thigh and his left thigh, knee, leg, ankle  and foot were prepped and draped with DuraPrep and sterile drapes including a sterile stockinette.  A timeout was called and he was identified as correct patient, correct left knee.  We then used Esmarch to wrap that leg and tourniquet was inflated to  300 mm of pressure.  I then made a direct midline incision over the patella and carried this proximally and distally.  I dissected down the knee joint, carried out a medial parapatellar arthrotomy, finding a moderate joint effusion.  There were  osteophytes in all three compartments, which were significant.  There was complete loss of the patellofemoral cartilage and the medial compartment cartilage as well.  With the knee in a flexed position, we removed osteophytes in all three compartments as  well as remnants of the medial and lateral meniscus and the ACL.  We then used extramedullary cutting guide for taking 2 mm off the low side, correcting for varus and valgus and a 3 degree slope.  We made this cut without difficulty and actually backed  it down 2 more  millimeters due to the nature of his very hard bone.  We then went to the femur and used an intramedullary cutting guide for our distal femoral cut, setting this for a left knee at 5 degrees externally rotated and 10 mm distal femoral cut.   We made this cut without difficulty, and brought the knee back down to full extension with a 10 mm extension block and achieved full extension.  We then went back to the femur and  put our femoral sizing guide based off the epicondylar axis and  Whitesides line.  Based off of this, we chose a size 10 femur.  We put a 4-in-1 cutting block for a size 10 femur, we made our anterior and posterior cuts, followed by our chamfer cuts.  We then went back to the tibia and chose a size G left tibial tray  for coverage over the tibial plateau setting, the rotation off the tibial tubercle and the femur.  We felt he had good quality bone for press-fit implants.  So we did our drill hole and keel punch off of this preparing for press-fit implants.  With a  size G left trial tibia, we trialed a size 10 left femur.  We went up to a 12 mm medial congruent fixed bearing polyethylene insert.  I was pleased with range of motion and stability with this 12 mm insert.  We then cut our patella and drilled a single  hole for press-fit size 35 patellar button.  We then put the knee through several cycles of motion with trial implants in place, and we were pleased with range of motion and stability.  We then removed all trial implants from the knee and irrigated the  knee with normal saline solution using pulsatile lavage.  We dried the knee real well and with the knee in a flexed position, placed our real Biomet Zimmer press-fit size G left tibial tray followed by press-fitting our size 10 left femur.  We placed our  real medial congruent 12 mm thickness left polyethylene insert and press fit our size 35 patellar button and again put him through several cycles of motion.  We were pleased with range of motion and stability.  Of note, we then placed Marcaine with  epinephrine around the arthrotomy.  The tourniquet was let down.  Hemostasis was obtained with electrocautery.  We then closed the arthrotomy with interrupted #1 Vicryl suture followed by 0 Vicryl to close the deep tissue and 2-0 Vicryl to close the  subcutaneous tissue.  The skin was closed with staples.  Well-padded sterile dressing was applied.  He  was awakened, extubated, and taken to recovery room in stable condition with all final counts being correct.  No complications noted.  Of note, Benita Stabile, PA-C, assisted during the entire case and assistance was crucial for facilitating every aspect of this case from beginning to end.  He helped with soft tissue retraction as well as helping guide implant placement and it was directly involved with  a layered closure of the wound.   PAA D: 07/07/2022 9:46:35 am T: 07/07/2022 10:37:00 am  JOB: [id number]/ [id number]

## 2022-07-07 NOTE — Interval H&P Note (Signed)
History and Physical Interval Note: The patient is here today for a left knee replacement to treat his left knee osteoarthritis.  There has been no acute or interval changes to medical status.  Please see H&P. The risks and benefits of surgery been explained in detail and informed consent was obtained.  The left operative knee has been marked.  07/07/2022 7:07 AM  Randy Combs  has presented today for surgery, with the diagnosis of OSTEOARTHRITIS LEFT KNEE.  The various methods of treatment have been discussed with the patient and family. After consideration of risks, benefits and other options for treatment, the patient has consented to  Procedure(s): LEFT TOTAL KNEE ARTHROPLASTY (Left) as a surgical intervention.  The patient's history has been reviewed, patient examined, no change in status, stable for surgery.  I have reviewed the patient's chart and labs.  Questions were answered to the patient's satisfaction.     Mcarthur Rossetti

## 2022-07-08 LAB — BASIC METABOLIC PANEL
Anion gap: 9 (ref 5–15)
BUN: 18 mg/dL (ref 6–20)
CO2: 25 mmol/L (ref 22–32)
Calcium: 9.2 mg/dL (ref 8.9–10.3)
Chloride: 101 mmol/L (ref 98–111)
Creatinine, Ser: 1.19 mg/dL (ref 0.61–1.24)
GFR, Estimated: >60 mL/min (ref >60)
Glucose, Bld: 164 mg/dL — ABNORMAL HIGH (ref 70–99)
Potassium: 4.6 mmol/L (ref 3.5–5.1)
Sodium: 135 mmol/L (ref 135–145)

## 2022-07-08 LAB — CBC
HCT: 36.3 % — ABNORMAL LOW (ref 39.0–52.0)
Hemoglobin: 12.2 g/dL — ABNORMAL LOW (ref 13.0–17.0)
MCH: 34.7 pg — ABNORMAL HIGH (ref 26.0–34.0)
MCHC: 33.6 g/dL (ref 30.0–36.0)
MCV: 103.1 fL — ABNORMAL HIGH (ref 80.0–100.0)
Platelets: 271 10*3/uL (ref 150–400)
RBC: 3.52 MIL/uL — ABNORMAL LOW (ref 4.22–5.81)
RDW: 14.3 % (ref 11.5–15.5)
WBC: 12.5 10*3/uL — ABNORMAL HIGH (ref 4.0–10.5)
nRBC: 0 % (ref 0.0–0.2)

## 2022-07-08 MED ORDER — ALUM & MAG HYDROXIDE-SIMETH 200-200-20 MG/5ML PO SUSP
15.0000 mL | Freq: Two times a day (BID) | ORAL | Status: DC | PRN
  Administered 2022-07-08 (×2): 15 mL via ORAL
  Filled 2022-07-08 (×2): qty 30

## 2022-07-08 MED ORDER — ASPIRIN 81 MG PO CHEW: 81.0000 mg | CHEWABLE_TABLET | Freq: Two times a day (BID) | ORAL | 1 refills | Status: DC

## 2022-07-08 MED ORDER — METHOCARBAMOL 500 MG PO TABS: 500.0000 mg | ORAL_TABLET | Freq: Four times a day (QID) | ORAL | 1 refills | Status: DC | PRN

## 2022-07-08 MED ORDER — OXYCODONE HCL 5 MG PO TABS
5.0000 mg | ORAL_TABLET | ORAL | 0 refills | Status: DC | PRN
Start: 2022-07-08 — End: 2022-07-31

## 2022-07-08 MED ORDER — METOCLOPRAMIDE HCL 5 MG/ML IJ SOLN
20.0000 mg | Freq: Once | INTRAVENOUS | Status: AC
  Administered 2022-07-08: 20 mg via INTRAVENOUS
  Filled 2022-07-08: qty 4

## 2022-07-08 MED ORDER — LABETALOL HCL 5 MG/ML IV SOLN
5.0000 mg | INTRAVENOUS | Status: DC | PRN
Start: 2022-07-08 — End: 2022-07-14
  Administered 2022-07-08 – 2022-07-10 (×4): 5 mg via INTRAVENOUS
  Filled 2022-07-08 (×4): qty 4

## 2022-07-08 NOTE — Plan of Care (Signed)
  Problem: Education: Goal: Knowledge of General Education information will improve Description: Including pain rating scale, medication(s)/side effects and non-pharmacologic comfort measures Outcome: Progressing   Problem: Pain Managment: Goal: General experience of comfort will improve Outcome: Progressing   Problem: Safety: Goal: Ability to remain free from injury will improve Outcome: Progressing   

## 2022-07-08 NOTE — Discharge Instructions (Signed)

## 2022-07-08 NOTE — Progress Notes (Signed)
Physical Therapy Treatment Patient Details Name: Randy Combs MRN: [id number] DOB: 1965/12/16 Today's Date: 07/08/2022   History of Present Illness 56 yo male S/P LTKA 07/07/22.Marland Kitchen PMH: gout, DM, glaucoma,    PT Comments    Pt is progressing nicely with mobility. Abdomen remains distended, pt c/o tightness. Amb encouraged, pt motivated and compliant.  Will continue PT POC.   Recommendations for follow up therapy are one component of a multi-disciplinary discharge planning process, led by the attending physician.  Recommendations may be updated based on patient status, additional functional criteria and insurance authorization.  Follow Up Recommendations  Follow physician's recommendations for discharge plan and follow up therapies     Assistance Recommended at Discharge Intermittent Supervision/Assistance  Patient can return home with the following A little help with walking and/or transfers;Assistance with cooking/housework;Assist for transportation;Help with stairs or ramp for entrance;A little help with bathing/dressing/bathroom   Equipment Recommendations  None recommended by PT    Recommendations for Other Services       Precautions / Restrictions Precautions Precautions: Fall;Knee Required Braces or Orthoses: Knee Immobilizer - Left Knee Immobilizer - Left: Discontinue once straight leg raise with < 10 degree lag Restrictions Weight Bearing Restrictions: No Other Position/Activity Restrictions: WBAT     Mobility  Bed Mobility Overal bed mobility: Needs Assistance Bed Mobility: Supine to Sit     Supine to sit: HOB elevated, Min guard     General bed mobility comments: pt able to use gait belt to control descent LLE. use of rail, min/guard for safety    Transfers Overall transfer level: Needs assistance Equipment used: Rolling walker (2 wheels) Transfers: Sit to/from Stand Sit to Stand: Min guard           General transfer comment: cues for hand  and LLE  position    Ambulation/Gait Ambulation/Gait assistance: Min guard, Supervision Gait Distance (Feet): 160 Feet Assistive device: Rolling walker (2 wheels) Gait Pattern/deviations: Step-to pattern, Step-through pattern       General Gait Details: improvd wt shift o LLE, LOB x1 with head turn requiring min assist to recover   Stairs             Wheelchair Mobility    Modified Rankin (Stroke Patients Only)       Balance                                            Cognition Arousal/Alertness: Awake/alert Behavior During Therapy: WFL for tasks assessed/performed Overall Cognitive Status: Within Functional Limits for tasks assessed                                          Exercises Total Joint Exercises Ankle Circles/Pumps: AROM, Both, 10 reps Knee Flexion: AROM, Left, 5 reps, Seated Goniometric ROM: grossly 12 to 70 degrees knee flexion (sitting)    General Comments        Pertinent Vitals/Pain Pain Assessment Pain Assessment: 0-10 Pain Score: 4  Pain Location: L patella Pain Descriptors / Indicators: Grimacing, Discomfort Pain Intervention(s): Limited activity within patient's tolerance, Monitored during session, Premedicated before session, Repositioned    Home Living                          Prior  Function            PT Goals (current goals can now be found in the care plan section) Acute Rehab PT Goals Patient Stated Goal: to not have pain PT Goal Formulation: With patient/family Time For Goal Achievement: 07/14/22 Potential to Achieve Goals: Good Progress towards PT goals: Progressing toward goals    Frequency    7X/week      PT Plan Current plan remains appropriate    Co-evaluation              AM-PAC PT "6 Clicks" Mobility   Outcome Measure  Help needed turning from your back to your side while in a flat bed without using bedrails?: A Little Help needed moving from lying on your  back to sitting on the side of a flat bed without using bedrails?: A Little Help needed moving to and from a bed to a chair (including a wheelchair)?: A Little Help needed standing up from a chair using your arms (e.g., wheelchair or bedside chair)?: A Little Help needed to walk in hospital room?: A Little Help needed climbing 3-5 steps with a railing? : A Little 6 Click Score: 18    End of Session Equipment Utilized During Treatment: Gait belt;Left knee immobilizer Activity Tolerance: Patient tolerated treatment well Patient left: in chair;with call bell/phone within reach;with chair alarm set;with family/visitor present Nurse Communication: Mobility status PT Visit Diagnosis: Unsteadiness on feet (R26.81);Muscle weakness (generalized) (M62.81)     Time: 3570-1779 PT Time Calculation (min) (ACUTE ONLY): 23 min  Charges:  $Gait Training: 23-37 mins                     Baxter Flattery, PT  Acute Rehab Dept [facility]) [phone]  [facility] Weekend Pager [facility] only)  [phone]  07/08/2022    [facility] 07/08/2022, 12:30 PM

## 2022-07-08 NOTE — Progress Notes (Addendum)
Physical Therapy Treatment Patient Details Name: Randy Combs MRN: [id number] DOB: 11-07-1966 Today's Date: 07/08/2022   History of Present Illness 56 yo male S/P LTKA 07/07/22.Marland Kitchen PMH: gout, DM, glaucoma,    PT Comments    Pt is progressing with mobility and knee ROM. He continues to complain of abdominal bloating and distention, denies nausea. Pt agreeable to mobilize as much as possible, have encouraged this as well given decr bowel motility. Encouraged pt to avoid drinking from straws also. Continue PT    Recommendations for follow up therapy are one component of a multi-disciplinary discharge planning process, led by the attending physician.  Recommendations may be updated based on patient status, additional functional criteria and insurance authorization.  Follow Up Recommendations  Follow physician's recommendations for discharge plan and follow up therapies     Assistance Recommended at Discharge Intermittent Supervision/Assistance  Patient can return home with the following A little help with walking and/or transfers;Assistance with cooking/housework;Assist for transportation;Help with stairs or ramp for entrance;A little help with bathing/dressing/bathroom   Equipment Recommendations  None recommended by PT    Recommendations for Other Services       Precautions / Restrictions Precautions Precautions: Fall;Knee Required Braces or Orthoses: Knee Immobilizer - Left Knee Immobilizer - Left: Discontinue once straight leg raise with < 10 degree lag Restrictions Weight Bearing Restrictions: No Other Position/Activity Restrictions: WBAT     Mobility  Bed Mobility Overal bed mobility: Needs Assistance Bed Mobility: Supine to Sit, Sit to Supine     Supine to sit: HOB elevated, Min guard Sit to supine: Min guard, Supervision   General bed mobility comments: use of rail, pt uses momentum, no physical assist, supervision for safety    Transfers Overall transfer level:  Needs assistance Equipment used: Rolling walker (2 wheels) Transfers: Sit to/from Stand Sit to Stand: Min guard           General transfer comment: cues for hand  and LLE position    Ambulation/Gait Ambulation/Gait assistance: Min guard, Supervision Gait Distance (Feet): 130 Feet Assistive device: Rolling walker (2 wheels) Gait Pattern/deviations: Step-to pattern, Step-through pattern, Decreased stride length, Decreased stance time - left       General Gait Details: improved wt shift to LLE, no LOB, steady with gait   Stairs             Wheelchair Mobility    Modified Rankin (Stroke Patients Only)       Balance                                            Cognition Arousal/Alertness: Awake/alert Behavior During Therapy: WFL for tasks assessed/performed Overall Cognitive Status: Within Functional Limits for tasks assessed                                          Exercises Total Joint Exercises Ankle Circles/Pumps: AROM, Both, 10 reps Quad Sets: AROM, Both, 10 reps Heel Slides: AROM, Left, 10 reps, AAROM Straight Leg Raises: AAROM, AROM, 10 reps, Left Knee Flexion: AROM, Left, 5 reps, Seated Goniometric ROM: grossly 12 to 70 degrees knee flexion (sitting)    General Comments        Pertinent Vitals/Pain Pain Assessment Pain Assessment: 0-10 Pain Score: 5  Pain Location: L patella  Pain Descriptors / Indicators: Grimacing, Discomfort Pain Intervention(s): Limited activity within patient's tolerance, Monitored during session, Repositioned    Home Living                          Prior Function            PT Goals (current goals can now be found in the care plan section) Acute Rehab PT Goals Patient Stated Goal: to not have pain PT Goal Formulation: With patient/family Time For Goal Achievement: 07/14/22 Potential to Achieve Goals: Good Progress towards PT goals: Progressing toward goals     Frequency    7X/week      PT Plan Current plan remains appropriate    Co-evaluation              AM-PAC PT "6 Clicks" Mobility   Outcome Measure  Help needed turning from your back to your side while in a flat bed without using bedrails?: A Little Help needed moving from lying on your back to sitting on the side of a flat bed without using bedrails?: A Little Help needed moving to and from a bed to a chair (including a wheelchair)?: A Little Help needed standing up from a chair using your arms (e.g., wheelchair or bedside chair)?: A Little Help needed to walk in hospital room?: A Little Help needed climbing 3-5 steps with a railing? : A Little 6 Click Score: 18    End of Session Equipment Utilized During Treatment: Gait belt;Left knee immobilizer Activity Tolerance: Patient tolerated treatment well Patient left: in bed;with bed alarm set Nurse Communication: Mobility status PT Visit Diagnosis: Unsteadiness on feet (R26.81);Muscle weakness (generalized) (M62.81)     Time: 1610-9604 PT Time Calculation (min) (ACUTE ONLY): 24 min  Charges:  $Gait Training: 8-22 mins $Therapeutic Exercise: 8-22 mins                     Baxter Flattery, PT  Acute Rehab Dept [facility]) [phone]  [facility] Weekend Pager [facility] only)  [phone]  07/08/2022    [facility] 07/08/2022, 5:08 PM

## 2022-07-08 NOTE — Plan of Care (Signed)
  Problem: Education: Goal: Knowledge of General Education information will improve Description: Including pain rating scale, medication(s)/side effects and non-pharmacologic comfort measures Outcome: Progressing   Problem: Pain Managment: Goal: General experience of comfort will improve 07/08/2022 0914 by Lisabeth Pick, RN Outcome: Progressing 07/08/2022 0907 by Lisabeth Pick, RN Outcome: Progressing   Problem: Safety: Goal: Ability to remain free from injury will improve 07/08/2022 0914 by Lisabeth Pick, RN Outcome: Progressing 07/08/2022 0907 by Lisabeth Pick, RN Outcome: Progressing

## 2022-07-08 NOTE — Progress Notes (Signed)
Subjective: 1 Day Post-Op Procedure(s) (LRB): LEFT TOTAL KNEE ARTHROPLASTY (Left) Patient reports pain as moderate.  His bigger complaint right now is not his knee but his sensation of being bloated and uncomfortable from an abdominal standpoint.  Objective: Vital signs in last 24 hours: Temp:  [97.6 F (36.4 C)-98.7 F (37.1 C)] 98.7 F (37.1 C) (08/05 0605) Pulse Rate:  [72-105] 93 (08/05 0605) Resp:  [13-18] 17 (08/05 0605) BP: (125-148)/(70-104) 148/104 (08/05 0605) SpO2:  [90 %-96 %] 90 % (08/05 0605)  Intake/Output from previous day: 08/04 0701 - 08/05 0700 In: 3055.5 [P.O.:600; I.V.:2200.5; IV Piggyback:255] Out: 900 [Urine:600; Blood:300] Intake/Output this shift: Total I/O In: 240 [P.O.:240] Out: -   Recent Labs    07/08/22 0242  HGB 12.2*   Recent Labs    07/08/22 0242  WBC 12.5*  RBC 3.52*  HCT 36.3*  PLT 271   Recent Labs    07/08/22 0242  NA 135  K 4.6  CL 101  CO2 25  BUN 18  CREATININE 1.19  GLUCOSE 164*  CALCIUM 9.2   No results for input(s): "LABPT", "INR" in the last 72 hours.  Sensation intact distally Intact pulses distally Dorsiflexion/Plantar flexion intact Incision: dressing C/D/I  He is obese but his abdomen is distended but soft.  Assessment/Plan: 1 Day Post-Op Procedure(s) (LRB): LEFT TOTAL KNEE ARTHROPLASTY (Left) Up with therapy We will try a combination of Reglan and Maalox for him.  Hopefully mobility and hydration will help as well.  He needs to stay today until this resolves.     Mcarthur Rossetti 07/08/2022, 10:19 AM

## 2022-07-09 ENCOUNTER — Observation Stay (HOSPITAL_COMMUNITY): Payer: 59

## 2022-07-09 DIAGNOSIS — D62 Acute posthemorrhagic anemia: Secondary | ICD-10-CM | POA: Diagnosis not present

## 2022-07-09 DIAGNOSIS — R1084 Generalized abdominal pain: Secondary | ICD-10-CM | POA: Diagnosis not present

## 2022-07-09 DIAGNOSIS — G928 Other toxic encephalopathy: Secondary | ICD-10-CM | POA: Diagnosis not present

## 2022-07-09 DIAGNOSIS — N179 Acute kidney failure, unspecified: Secondary | ICD-10-CM | POA: Diagnosis not present

## 2022-07-09 DIAGNOSIS — J9601 Acute respiratory failure with hypoxia: Secondary | ICD-10-CM | POA: Diagnosis not present

## 2022-07-09 DIAGNOSIS — R0600 Dyspnea, unspecified: Secondary | ICD-10-CM

## 2022-07-09 LAB — COMPREHENSIVE METABOLIC PANEL
ALT: 23 U/L (ref 0–44)
AST: 45 U/L — ABNORMAL HIGH (ref 15–41)
Albumin: 4.2 g/dL (ref 3.5–5.0)
Alkaline Phosphatase: 65 U/L (ref 38–126)
Anion gap: 13 (ref 5–15)
BUN: 32 mg/dL — ABNORMAL HIGH (ref 6–20)
CO2: 26 mmol/L (ref 22–32)
Calcium: 9.8 mg/dL (ref 8.9–10.3)
Chloride: 95 mmol/L — ABNORMAL LOW (ref 98–111)
Creatinine, Ser: 2.39 mg/dL — ABNORMAL HIGH (ref 0.61–1.24)
GFR, Estimated: 31 mL/min — ABNORMAL LOW (ref >60)
Glucose, Bld: 189 mg/dL — ABNORMAL HIGH (ref 70–99)
Potassium: 4.4 mmol/L (ref 3.5–5.1)
Sodium: 134 mmol/L — ABNORMAL LOW (ref 135–145)
Total Bilirubin: 2.2 mg/dL — ABNORMAL HIGH (ref 0.3–1.2)
Total Protein: 8.5 g/dL — ABNORMAL HIGH (ref 6.5–8.1)

## 2022-07-09 LAB — URINALYSIS, ROUTINE W REFLEX MICROSCOPIC
Bacteria, UA: NONE SEEN
Bilirubin Urine: NEGATIVE
Glucose, UA: NEGATIVE mg/dL
Hgb urine dipstick: NEGATIVE
Ketones, ur: NEGATIVE mg/dL
Leukocytes,Ua: NEGATIVE
Nitrite: NEGATIVE
Protein, ur: 30 mg/dL — AB
Specific Gravity, Urine: 1.023 (ref 1.005–1.030)
pH: 5 (ref 5.0–8.0)

## 2022-07-09 LAB — LIPASE, BLOOD: Lipase: 24 U/L (ref 11–51)

## 2022-07-09 LAB — CBC
HCT: 37.9 % — ABNORMAL LOW (ref 39.0–52.0)
Hemoglobin: 13.2 g/dL (ref 13.0–17.0)
MCH: 35.8 pg — ABNORMAL HIGH (ref 26.0–34.0)
MCHC: 34.8 g/dL (ref 30.0–36.0)
MCV: 102.7 fL — ABNORMAL HIGH (ref 80.0–100.0)
Platelets: 314 10*3/uL (ref 150–400)
RBC: 3.69 MIL/uL — ABNORMAL LOW (ref 4.22–5.81)
RDW: 14.4 % (ref 11.5–15.5)
WBC: 9.3 10*3/uL (ref 4.0–10.5)
nRBC: 0 % (ref 0.0–0.2)

## 2022-07-09 LAB — D-DIMER, QUANTITATIVE: D-Dimer, Quant: 2.08 ug/mL-FEU — ABNORMAL HIGH (ref 0.00–0.50)

## 2022-07-09 LAB — GLUCOSE, CAPILLARY: Glucose-Capillary: 189 mg/dL — ABNORMAL HIGH (ref 70–99)

## 2022-07-09 MED ORDER — POLYETHYLENE GLYCOL 3350 17 G PO PACK
17.0000 g | PACK | Freq: Every day | ORAL | Status: DC
  Administered 2022-07-09: 17 g via ORAL
  Filled 2022-07-09: qty 1

## 2022-07-09 MED ORDER — ENOXAPARIN SODIUM 40 MG/0.4ML IJ SOSY
40.0000 mg | PREFILLED_SYRINGE | INTRAMUSCULAR | Status: DC
Start: 2022-07-09 — End: 2022-07-09

## 2022-07-09 MED ORDER — BISACODYL 10 MG RE SUPP
10.0000 mg | Freq: Every day | RECTAL | Status: DC | PRN
  Administered 2022-07-09 – 2022-07-10 (×2): 10 mg via RECTAL
  Filled 2022-07-09 (×2): qty 1

## 2022-07-09 MED ORDER — HEPARIN SODIUM (PORCINE) 5000 UNIT/ML IJ SOLN
5000.0000 [IU] | Freq: Three times a day (TID) | INTRAMUSCULAR | Status: DC
  Administered 2022-07-09 – 2022-07-14 (×14): 5000 [IU] via SUBCUTANEOUS
  Filled 2022-07-09 (×14): qty 1

## 2022-07-09 MED ORDER — FLEET ENEMA 7-19 GM/118ML RE ENEM
1.0000 | ENEMA | Freq: Every day | RECTAL | Status: DC | PRN
Start: 2022-07-09 — End: 2022-07-10
  Administered 2022-07-09: 1 via RECTAL
  Filled 2022-07-09: qty 1

## 2022-07-09 MED ORDER — IOHEXOL 9 MG/ML PO SOLN
500.0000 mL | ORAL | Status: AC
  Administered 2022-07-09 (×2): 500 mL via ORAL

## 2022-07-09 MED ORDER — SODIUM CHLORIDE 0.45 % IV SOLN: INTRAVENOUS | Status: DC

## 2022-07-09 MED ORDER — SODIUM CHLORIDE 0.45 % IV BOLUS
500.0000 mL | Freq: Once | INTRAVENOUS | Status: AC
  Administered 2022-07-09: 500 mL via INTRAVENOUS

## 2022-07-09 MED ORDER — SODIUM CHLORIDE 0.9 % IV SOLN: INTRAVENOUS | Status: DC

## 2022-07-09 MED ORDER — SENNOSIDES-DOCUSATE SODIUM 8.6-50 MG PO TABS
1.0000 | ORAL_TABLET | Freq: Two times a day (BID) | ORAL | Status: DC
  Administered 2022-07-09 – 2022-07-10 (×2): 1 via ORAL
  Filled 2022-07-09 (×2): qty 1

## 2022-07-09 MED ORDER — LACTULOSE 10 GM/15ML PO SOLN
20.0000 g | Freq: Once | ORAL | Status: AC
Start: 2022-07-09 — End: 2022-07-09
  Administered 2022-07-09: 20 g via ORAL
  Filled 2022-07-09: qty 30

## 2022-07-09 NOTE — Progress Notes (Signed)
PT Cancellation Note  Patient Details Name: Randy Combs MRN: [id number] DOB: November 29, 1966   Cancelled Treatment:     PM session cancelled due to pt walking to and from bathroom several times to attempt to move his bowels.    Rica Koyanagi  PTA Acute  Rehabilitation Services Office M-F          [phone] Weekend pager [phone]

## 2022-07-09 NOTE — Progress Notes (Addendum)
   07/09/22 1703  Assess: MEWS Score  Temp 98.9 F (37.2 C)  BP (!) 151/101  Pulse Rate (!) 115  Resp 19  Level of Consciousness Alert  SpO2 90 %  O2 Device Room Air (applied 2lnc)  Assess: MEWS Score  MEWS Temp 0  MEWS Systolic 0  MEWS Pulse 2  MEWS RR 0  MEWS LOC 0  MEWS Score 2  MEWS Score Color Yellow  Assess: if the MEWS score is Yellow or Red  Were vital signs taken at a resting state? Yes  Focused Assessment Change from prior assessment (see assessment flowsheet)  Does the patient meet 2 or more of the SIRS criteria? No  MEWS guidelines implemented *See Row Information* Yes  Treat  Pain Scale 0-10  Pain Score 5  Pain Type Acute pain  Pain Location Abdomen  Pain Descriptors / Indicators Aching  Pain Frequency Constant  Pain Onset On-going  Patients Stated Pain Goal 2  Take Vital Signs  Increase Vital Sign Frequency  Yellow: Q 2hr X 2 then Q 4hr X 2, if remains yellow, continue Q 4hrs  Escalate  MEWS: Escalate Yellow: discuss with charge nurse/RN and consider discussing with provider and RRT  Notify: Charge Nurse/RN  Name of Charge Nurse/RN Notified Transport planner  Date Charge Nurse/RN Notified 07/09/22  Time Charge Nurse/RN Notified 1700  Notify: Provider  Provider Name/Title B[place]an  Date Provider Notified 07/09/22  Time Provider Notified 1700  Method of Notification Call  Notification Reason Change in status  Provider response See new orders  Date of Provider Response 07/09/22  Time of Provider Response 1700  Document  Patient Outcome Other (Comment) (Orders placed and hosptialist came to see pt.)  Assess: SIRS CRITERIA  SIRS Temperature  0  SIRS Pulse 1  SIRS Respirations  0  SIRS WBC 1  SIRS Score Sum  2   Rn called to room by family at 80. Pt was found to be very tired, sweaty, and short of breath (with respiratory rate of 19). Family/ pt mentioned several areas of concern with pt belly pain. Rn had previously been in the room less than an hour  earlier and pt looked much more comfortable them. Vs obtained, assessment performed, ekg, and blood glucose obtained. Dr. Sammuel Hines called concerning the new findings of belly tenderness, sweating, shortness of breath, and overall being more fatigued. Abdominal xray ordered, pt placed npo for now, and iv fluids were started. Dr. [place] Pink to room to see pt as well after call with Dr. Sammuel Hines. Pt overall remained stable with this event though he did require 2l [place] to be placed due to 02 sat of 90 percent on room air.

## 2022-07-09 NOTE — Plan of Care (Signed)
  Problem: Education: Goal: Knowledge of General Education information will improve Description: Including pain rating scale, medication(s)/side effects and non-pharmacologic comfort measures Outcome: Progressing   Problem: Health Behavior/Discharge Planning: Goal: Ability to manage health-related needs will improve Outcome: Progressing   Problem: Activity: Goal: Risk for activity intolerance will decrease Outcome: Progressing   Problem: Nutrition: Goal: Adequate nutrition will be maintained Outcome: Progressing   Problem: Elimination: Goal: Will not experience complications related to urinary retention Outcome: Progressing   Problem: Pain Managment: Goal: General experience of comfort will improve Outcome: Progressing   

## 2022-07-09 NOTE — Progress Notes (Signed)
Pt remains stable with vitals though tired and short of breath with moderate exertion. He has made several trips to the restroom to attempt to move bowels. Pt 02 sat was 99 percent on room air on return to bed and no changes were found in pt lung sounds. Pt bowel sounds were more active then this am. Family remains at bedside. Rn will continue to monitor.

## 2022-07-09 NOTE — Consult Note (Addendum)
[facility] Medical Consultation  Randy Combs GQQ:[id number] DOB: January 08, 1966 DOA: 07/07/2022   PCP: Owens Loffler, MD    Requesting physician: Dr. Sammuel Hines with orthopedics Date of consultation: 07/09/2022  Reason for consultation: Abdominal pain, tachycardia  Chief Complaint: Abdominal pain with nausea  HPI: Randy Combs is a 56 y.o. male with past medical history of alcohol abuse, gout who had a previous history of diabetes type 2 and hypertension but is no longer on medications after he intentionally lost a significant amount of weight.  Patient was hospitalized for left total knee arthroplasty which was done on 07/07/2022.  Ever since surgery patient has felt more bloated in his abdomen.  Has been passing some gas from below but only after he was given enema.  He has had some nausea but no vomiting.  Some difficulty breathing has been noted but his main concern is that his abdomen is much more noted than usual.  He is experiencing pain in the right flank area and in the right side of the abdomen.  No radiation.  Pain level is about 6 out of 10 in intensity.  Denies any blood in the urine but has noted dark-colored urine.  No fever or chills.  His heart rate has been noted to be elevated.  Denies any chest pain per se.  He has had abdominal surgery previously about 5 years ago for hiatal hernia repair which was done in [place].  Hospitalist service was called to assist with management.   Home Medications: Prior to Admission medications   Medication Sig Start Date End Date Taking? Authorizing Provider  cyclobenzaprine (FLEXERIL) 10 MG tablet Take 1 tablet (10 mg total) by mouth 3 (three) times daily as needed for muscle spasms. 06/28/22  Yes Mcarthur Rossetti, MD  indomethacin (INDOCIN) 50 MG capsule TAKE 1 CAPSULE BY MOUTH THREE TIMES A DAY AS NEEDED 11/25/21  Yes Copland, Frederico Hamman, MD  omeprazole (PRILOSEC) 20 MG capsule Take 1 capsule (20 mg total) by mouth daily.  12/16/21  Yes Kate Sable, MD  aspirin 81 MG chewable tablet Chew 1 tablet (81 mg total) by mouth 2 (two) times daily. 07/08/22   Mcarthur Rossetti, MD  colchicine 0.6 MG tablet Take 1 tablet (0.6 mg total) by mouth 2 (two) times daily. Patient not taking: Reported on 06/22/2022 04/03/22   Copland, Frederico Hamman, MD  methocarbamol (ROBAXIN) 500 MG tablet Take 1 tablet (500 mg total) by mouth every 6 (six) hours as needed for muscle spasms. 07/08/22   Mcarthur Rossetti, MD  oxyCODONE (OXY IR/ROXICODONE) 5 MG immediate release tablet Take 1-2 tablets (5-10 mg total) by mouth every 4 (four) hours as needed for moderate pain (pain score 4-6). 07/08/22   Mcarthur Rossetti, MD  traMADol (ULTRAM) 50 MG tablet Take 2 tablets (100 mg total) by mouth every 6 (six) hours as needed. 06/28/22   Mcarthur Rossetti, MD    Current Inpatient Medications: Scheduled:  aspirin  81 mg Oral BID   docusate sodium  100 mg Oral BID   gabapentin  100 mg Oral TID   pantoprazole  40 mg Oral Daily   polyethylene glycol  17 g Oral Daily   senna-docusate  1 tablet Oral BID   Continuous:  sodium chloride Stopped (07/08/22 1000)   sodium chloride 100 mL/hr at 07/09/22 1722   methocarbamol (ROBAXIN) IV 500 mg (07/07/22 1020)   IZT:IWPYKDXIPJASN, alum & mag hydroxide-simeth, bisacodyl, diphenhydrAMINE, HYDROmorphone (DILAUDID) injection, labetalol, menthol-cetylpyridinium **OR** phenol, methocarbamol **OR** methocarbamol (ROBAXIN)  IV, metoCLOPramide **OR** metoCLOPramide (REGLAN) injection, ondansetron **OR** ondansetron (ZOFRAN) IV, oxyCODONE, oxyCODONE, polyethylene glycol, sodium phosphate  Allergies: No Active Allergies  Past Medical History: Past Medical History:  Diagnosis Date   Alcohol abuse, in remission    quit in 2008   Arthritis    Bleeding ulcer    Blood transfusion without reported diagnosis    Glaucoma    Gout of multiple sites 03/01/2015   Hiatal hernia    Hypertension    Uncontrolled  type 2 diabetes mellitus, without long-term current use of insulin 06/12/2021    Past Surgical History:  Procedure Laterality Date   APPENDECTOMY  1970's   HERNIA REPAIR     abdominal    Social History:  Lives with his wife.  Chews tobacco.  Drinks about 3-4 times a week and whenever he drinks he has 3-4 shots of liquor.  Last drink was about 2 weeks ago.  Family History:  Family History  Adopted: Yes  Problem Relation Age of Onset   Lung cancer Mother    Healthy Father    Healthy Child    Healthy Child    Colon cancer Neg Hx    Esophageal cancer Neg Hx      Review of Systems - History obtained from the patient General ROS: positive for  - fatigue Psychological ROS: negative Ophthalmic ROS: negative ENT ROS: negative Allergy and Immunology ROS: negative Hematological and Lymphatic ROS: negative Endocrine ROS: negative Respiratory ROS: As in HPI Cardiovascular ROS: As in HPI Gastrointestinal ROS: As in HPI Genito-Urinary ROS: Has had some urinary hesitancy but it is a chronic symptom. Musculoskeletal ROS: Left knee pain has been improving Neurological ROS: no TIA or stroke symptoms Dermatological ROS: negative  Physical Examination: Vitals:   07/09/22 0539 07/09/22 1350 07/09/22 1703 07/09/22 1718  BP: (!) 156/107 129/72 (!) 151/101 (!) 147/97  Pulse: 79 80 (!) 115 (!) 110  Resp: '18 18 19 18  '$ Temp: 98.4 F (36.9 C) 98.7 F (37.1 C) 98.9 F (37.2 C)   TempSrc:  Oral Oral   SpO2: (!) 78% 99% 90% 96%  Weight:      Height:        General appearance: alert, cooperative, appears stated age, and no distress Head: Normocephalic, without obvious abnormality, atraumatic Eyes: conjunctivae/corneas clear. PERRL, EOM's intact.  Neck: no adenopathy, no carotid bruit, no JVD, supple, symmetrical, trachea midline, and thyroid not enlarged, symmetric, no tenderness/mass/nodules Back: symmetric, no curvature. ROM normal. No CVA tenderness. Resp: Mildly tachypneic without  any use of accessory muscles.  Few crackles in the right base.  No wheezing or rhonchi. Cardio: regular rate and rhythm, S1, S2 normal, no murmur, click, rub or gallop GI: Abdomen is noted to be distended.  High-pitched bowel sounds heard.  Tender diffusely but more so on the right side without any rebound rigidity or guarding.  Mild right CVA tenderness. Extremities: Swelling of the left lower extremity from recent knee surgery Pulses: 2+ and symmetric Skin: Skin color, texture, turgor normal. No rashes or lesions Lymph nodes: Cervical, supraclavicular, and axillary nodes normal. Neurologic:  alert and oriented x3.  Cranial nerves II to XII intact.  Motor strength equal bilateral upper and lower extremity.  Decreased range of motion of the left leg is due to recent surgery.    Laboratory Data: Results for orders placed or performed during the hospital encounter of 07/07/22 (from the past 48 hour(s))  CBC     Status: Abnormal   Collection Time: 07/08/22  2:42 AM  Result Value Ref Range   WBC 12.5 (H) 4.0 - 10.5 K/uL   RBC 3.52 (L) 4.22 - 5.81 MIL/uL   Hemoglobin 12.2 (L) 13.0 - 17.0 g/dL   HCT 36.3 (L) 39.0 - 52.0 %   MCV 103.1 (H) 80.0 - 100.0 fL   MCH 34.7 (H) 26.0 - 34.0 pg   MCHC 33.6 30.0 - 36.0 g/dL   RDW 14.3 11.5 - 15.5 %   Platelets 271 150 - 400 K/uL   nRBC 0.0 0.0 - 0.2 %    Comment: Performed at [facility], [place] [address], [place], [place] [address]  Basic metabolic panel     Status: Abnormal   Collection Time: 07/08/22  2:42 AM  Result Value Ref Range   Sodium 135 135 - 145 mmol/L   Potassium 4.6 3.5 - 5.1 mmol/L   Chloride 101 98 - 111 mmol/L   CO2 25 22 - 32 mmol/L   Glucose, Bld 164 (H) 70 - 99 mg/dL    Comment: Glucose reference range applies only to samples taken after fasting for at least 8 hours.   BUN 18 6 - 20 mg/dL   Creatinine, Ser 1.19 0.61 - 1.24 mg/dL   Calcium 9.2 8.9 - 10.3 mg/dL   GFR, Estimated >60 >60 mL/min    Comment:  (NOTE) Calculated using the CKD-EPI Creatinine Equation (2021)    Anion gap 9 5 - 15    Comment: Performed at [facility], [place] [address] [place],  [address]  Glucose, capillary     Status: Abnormal   Collection Time: 07/09/22  4:44 PM  Result Value Ref Range   Glucose-Capillary 189 (H) 70 - 99 mg/dL    Comment: Glucose reference range applies only to samples taken after fasting for at least 8 hours.    Imaging Studies: No results found.  EKG: EKG is pending  Impression/Recommendations  Principal Problem:   Unilateral primary osteoarthritis, left knee Active Problems:   Status post total left knee replacement   Assessment: This is 56 year old Caucasian male who was hospitalized for left total knee replacement which was done on 07/07/2022.  Over the last 2 days he has noted abdominal bloating which has been worsening.  He has experienced right-sided abdominal discomfort.  He was noted to be tachycardic likely secondary to his discomfort.  He also has had episodes of hypoxia especially at nighttime.  Etiology of symptoms is not clear.  He does have high-pitched bowel sounds which raises concern for ileus.  He has pain in the right side which raises concern for nephrolithiasis.  No recent abdominal imaging studies in our system.  Plan:  #1. Abdominal pain with nausea: Has abdominal distention and high-pitched bowel sounds raise concern for ileus or bowel obstruction.  Abdominal films have been ordered by orthopedic surgeon.  This will be followed up on.  We will get current labs including CBC complete metabolic panel and lipase level.  We will check a UA.  Agree with keeping him n.p.o. for now.  Patient was given laxatives and enemas earlier today without much relief to his symptoms.  #2. Dyspnea: Could be from his abdominal distention.  He does have a few crackles at the right base.  He has had some hypoxia overnight.  Proceed withchest x-ray for now.  Does not  have any chest pain.  It is possible he has some degree of sleep apnea.  He is obese.  He will benefit from outpatient  sleep study.  Currently on oxygen by nasal cannula with saturations in the 90s.  Not noted to be on DVT prophylaxis.  We will check a D-dimer.  #3. Tachycardia: Appears to be regular.  EKG is pending.  Probably secondary to abdominal pain and discomfort.  #4. Alcohol abuse: Does drink 3-4 times week with more than 3-4 drinks every day that he does drink.  No alcohol consumption in the last 2 weeks per patient.  Counseled.  Thiamine folic acid multivitamins when he is able to take orally.  #5. Status post right knee replacement: Management per orthopedics.  Not noted to be on any DVT prophylaxis.  No reports of bleeding.  If blood work looks stable he will be started on Lovenox.  Further management per orthopedics.  ADDENDUM Abnormal labs and abdo x ray report noted. Will start IVF, order CT AP, LE dopplers and VQ scan.  I will followup again tomorrow. Please contact me if I can be of assistance in the meanwhile. Thank you for this consultation.  Neyland Pettengill [employer]  [facility] operational officer on [email or web address]   07/09/2022, 5:35 PM

## 2022-07-09 NOTE — TOC Transition Note (Signed)
Transition of Care [facility]) - CM/SW Discharge Note   Patient Details  Name: Randy Combs MRN: [id number] Date of Birth: 1966/09/06  Transition of Care [facility]) CM/SW Contact:  Ross Ludwig, LCSW Phone Number: 07/09/2022, 12:03 PM   Clinical Narrative:    CSW was informed that patient needs a rolling walker.  CSW spoke to [place] at [place], she can get a walker delivered to him today before he leaves.  [name] will be delivered to patient's room today. Patient's HH was prearranged with [facility].  He will be going home with home health PT through [facility].  CSW signing off please reconsult with any other social work needs, home health agency has been notified of planned discharge.    Final next level of care: [place] Barriers to Discharge: Continued Medical Work up   Patient Goals and CMS Choice Patient states their goals for this hospitalization and ongoing recovery are:: To return back home with home health PT. CMS Medicare.gov Compare Post Acute Care list provided to:: Patient Choice offered to / list presented to : Patient  Discharge Placement                       Discharge Plan and Services                DME Arranged: Walker rolling DME Agency: [employer] Date DME Agency Contacted: 07/09/22 Time DME Agency Contacted: 1202 Representative spoke with at DME Agency: [place]: PT [place]: [place]        Social Determinants of Health ([place]) Interventions     Readmission Risk Interventions     No data to display

## 2022-07-09 NOTE — Progress Notes (Signed)
Rn called Dr. Sammuel Hines to obtain orders for bowel needs. Pt has had to small bowel movements but belly remains distended. Rn placed orders in EMR. Pt remains stable with no needs. Rn will continue to monitor.

## 2022-07-09 NOTE — Progress Notes (Signed)
Subjective: 2 Days Post-Op Procedure(s) (LRB): LEFT TOTAL KNEE ARTHROPLASTY (Left) Patient reports pain as moderate.  At this point he remains quite bloated and has not passed gas or any type of bowel movement.  He has not had any nausea or vomiting although his abdomen remains full.  Objective: Vital signs in last 24 hours: Temp:  [98.2 F (36.8 C)-98.9 F (37.2 C)] 98.4 F (36.9 C) (08/06 0539) Pulse Rate:  [79-109] 79 (08/06 0539) Resp:  [16-18] 18 (08/06 0539) BP: (107-164)/(85-117) 156/107 (08/06 0539) SpO2:  [78 %-98 %] 78 % (08/06 0539)  Intake/Output from previous day: 08/05 0701 - 08/06 0700 In: 240 [P.O.:240] Out: -  Intake/Output this shift: No intake/output data recorded.  Recent Labs    07/08/22 0242  HGB 12.2*    Recent Labs    07/08/22 0242  WBC 12.5*  RBC 3.52*  HCT 36.3*  PLT 271    Recent Labs    07/08/22 0242  NA 135  K 4.6  CL 101  CO2 25  BUN 18  CREATININE 1.19  GLUCOSE 164*  CALCIUM 9.2    No results for input(s): "LABPT", "INR" in the last 72 hours.  Sensation intact distally Intact pulses distally Dorsiflexion/Plantar flexion intact Incision: dressing C/D/I  He is obese but his abdomen is distended but soft.  Assessment/Plan: 2 Days Post-Op Procedure(s) (LRB): LEFT TOTAL KNEE ARTHROPLASTY (Left) Up with therapy  At this time we will plan to advance his bowel regiment in the hopes that we can get this going.  This has been limiting his ability to work with therapy progress.  We will plan to start with suppository and advanced enema if no improvement.  Likely will need to remain here today to work on this.     Keisha Amer 07/09/2022, 7:32 AM

## 2022-07-09 NOTE — Progress Notes (Signed)
Physical Therapy Treatment Patient Details Name: Randy Combs MRN: [id number] DOB: 08/18/1966 Today's Date: 07/09/2022   History of Present Illness 56 yo male S/P LTKA 07/07/22.Marland Kitchen PMH: gout, DM, glaucoma,    PT Comments    POD # 2 am session Pt VERy uncomfortable ABD distention/bloating.  Assisted OOB required increased time.  General bed mobility comments: demonstarted and instructed how to use belt to assist LE also need for rails and increased time due to c/o enlarged ABD.  General bed mobility comments: demonstarted and instructed how to use belt to assist LE also need for rails and increased time due to c/o enlarged ABD  Assisted with amb to bathroom with spouse in room.  General Gait Details: only amb 12 feet x 2 to and from bathroom due to need to have a BM.  Assisted back to bed for nurse (suppository/enema).   Recommendations for follow up therapy are one component of a multi-disciplinary discharge planning process, led by the attending physician.  Recommendations may be updated based on patient status, additional functional criteria and insurance authorization.  Follow Up Recommendations  Follow physician's recommendations for discharge plan and follow up therapies     Assistance Recommended at Discharge Intermittent Supervision/Assistance  Patient can return home with the following A little help with walking and/or transfers;Assistance with cooking/housework;Assist for transportation;Help with stairs or ramp for entrance;A little help with bathing/dressing/bathroom   Equipment Recommendations  None recommended by PT    Recommendations for Other Services       Precautions / Restrictions Precautions Precautions: Fall;Knee Precaution Comments: no pillow under knee Restrictions Weight Bearing Restrictions: No Other Position/Activity Restrictions: WBAT     Mobility  Bed Mobility Overal bed mobility: Needs Assistance Bed Mobility: Supine to Sit, Sit to Supine      Supine to sit: HOB elevated, Min guard, Supervision Sit to supine: Min assist   General bed mobility comments: demonstarted and instructed how to use belt to assist LE also need for rails and increased time due to c/o enlarged ABD    Transfers Overall transfer level: Needs assistance Equipment used: Rolling walker (2 wheels) Transfers: Sit to/from Stand Sit to Stand: Supervision, Min guard           General transfer comment: cues for hand  and LLE position also assisted with a toilet transfer.    Ambulation/Gait Ambulation/Gait assistance: Supervision, Min guard Gait Distance (Feet): 24 Feet Assistive device: Rolling walker (2 wheels) Gait Pattern/deviations: Step-to pattern, Step-through pattern, Decreased stride length, Decreased stance time - left Gait velocity: decreased     General Gait Details: only amb 12 feet x 2 to and from bathroom due to need to have a BM   Stairs             Wheelchair Mobility    Modified Rankin (Stroke Patients Only)       Balance                                            Cognition Arousal/Alertness: Awake/alert Behavior During Therapy: WFL for tasks assessed/performed Overall Cognitive Status: Within Functional Limits for tasks assessed                                 General Comments: AxO x 3 feeling VERY uncomfortable today with c/o enlarged ABD  and need to have a BM but can't seem to        Exercises      General Comments        Pertinent Vitals/Pain Pain Assessment Pain Assessment: 0-10 Pain Score: 8  Pain Location: L knee Pain Descriptors / Indicators: Grimacing, Discomfort Pain Intervention(s): Monitored during session, Premedicated before session, Repositioned, Ice applied    Home Living                          Prior Function            PT Goals (current goals can now be found in the care plan section) Progress towards PT goals: Progressing toward  goals    Frequency    7X/week      PT Plan Current plan remains appropriate    Co-evaluation              AM-PAC PT "6 Clicks" Mobility   Outcome Measure  Help needed turning from your back to your side while in a flat bed without using bedrails?: A Little Help needed moving from lying on your back to sitting on the side of a flat bed without using bedrails?: A Little Help needed moving to and from a bed to a chair (including a wheelchair)?: A Little Help needed standing up from a chair using your arms (e.g., wheelchair or bedside chair)?: A Little Help needed to walk in hospital room?: A Little Help needed climbing 3-5 steps with a railing? : A Little 6 Click Score: 18    End of Session Equipment Utilized During Treatment: Gait belt Activity Tolerance: Patient tolerated treatment well Patient left: in bed;with bed alarm set;with call bell/phone within reach;with family/visitor present;with nursing/sitter in room Nurse Communication: Mobility status PT Visit Diagnosis: Unsteadiness on feet (R26.81);Muscle weakness (generalized) (M62.81)     Time: 4982-6415 PT Time Calculation (min) (ACUTE ONLY): 26 min  Charges:  $Gait Training: 8-22 mins $Therapeutic Activity: 8-22 mins                    Rica Koyanagi  PTA [place] Office M-F          [phone] Weekend pager [phone]

## 2022-07-10 ENCOUNTER — Encounter (HOSPITAL_COMMUNITY): Payer: Self-pay | Admitting: Orthopaedic Surgery

## 2022-07-10 ENCOUNTER — Observation Stay (HOSPITAL_COMMUNITY): Payer: 59

## 2022-07-10 ENCOUNTER — Observation Stay (HOSPITAL_BASED_OUTPATIENT_CLINIC_OR_DEPARTMENT_OTHER): Payer: 59

## 2022-07-10 DIAGNOSIS — J9601 Acute respiratory failure with hypoxia: Secondary | ICD-10-CM

## 2022-07-10 DIAGNOSIS — M7989 Other specified soft tissue disorders: Secondary | ICD-10-CM | POA: Diagnosis not present

## 2022-07-10 DIAGNOSIS — K567 Ileus, unspecified: Secondary | ICD-10-CM

## 2022-07-10 DIAGNOSIS — F10921 Alcohol use, unspecified with intoxication delirium: Secondary | ICD-10-CM

## 2022-07-10 DIAGNOSIS — R0600 Dyspnea, unspecified: Secondary | ICD-10-CM | POA: Diagnosis not present

## 2022-07-10 DIAGNOSIS — Z9911 Dependence on respirator [ventilator] status: Secondary | ICD-10-CM

## 2022-07-10 DIAGNOSIS — D62 Acute posthemorrhagic anemia: Secondary | ICD-10-CM | POA: Diagnosis not present

## 2022-07-10 DIAGNOSIS — G928 Other toxic encephalopathy: Secondary | ICD-10-CM | POA: Diagnosis not present

## 2022-07-10 DIAGNOSIS — M1712 Unilateral primary osteoarthritis, left knee: Secondary | ICD-10-CM | POA: Diagnosis not present

## 2022-07-10 DIAGNOSIS — J9602 Acute respiratory failure with hypercapnia: Secondary | ICD-10-CM | POA: Diagnosis not present

## 2022-07-10 DIAGNOSIS — E871 Hypo-osmolality and hyponatremia: Secondary | ICD-10-CM | POA: Diagnosis not present

## 2022-07-10 DIAGNOSIS — N179 Acute kidney failure, unspecified: Secondary | ICD-10-CM | POA: Diagnosis not present

## 2022-07-10 LAB — BASIC METABOLIC PANEL WITH GFR
Anion gap: 12 (ref 5–15)
BUN: 29 mg/dL — ABNORMAL HIGH (ref 6–20)
CO2: 23 mmol/L (ref 22–32)
Calcium: 8.9 mg/dL (ref 8.9–10.3)
Chloride: 95 mmol/L — ABNORMAL LOW (ref 98–111)
Creatinine, Ser: 1.53 mg/dL — ABNORMAL HIGH (ref 0.61–1.24)
GFR, Estimated: 53 mL/min — ABNORMAL LOW
Glucose, Bld: 161 mg/dL — ABNORMAL HIGH (ref 70–99)
Potassium: 4.4 mmol/L (ref 3.5–5.1)
Sodium: 130 mmol/L — ABNORMAL LOW (ref 135–145)

## 2022-07-10 LAB — BLOOD GAS, ARTERIAL
Acid-Base Excess: 1.5 mmol/L (ref 0.0–2.0)
Bicarbonate: 25.9 mmol/L (ref 20.0–28.0)
Drawn by: 31394
FIO2: 100 %
MECHVT: 580 mL
O2 Saturation: 100 %
PEEP: 5 cmH2O
Patient temperature: 38.7
RATE: 20 resp/min
pCO2 arterial: 42 mmHg (ref 32–48)
pH, Arterial: 7.41 (ref 7.35–7.45)
pO2, Arterial: 184 mmHg — ABNORMAL HIGH (ref 83–108)

## 2022-07-10 LAB — GLUCOSE, CAPILLARY
Glucose-Capillary: 150 mg/dL — ABNORMAL HIGH (ref 70–99)
Glucose-Capillary: 158 mg/dL — ABNORMAL HIGH (ref 70–99)

## 2022-07-10 LAB — MRSA NEXT GEN BY PCR, NASAL: MRSA by PCR Next Gen: NOT DETECTED

## 2022-07-10 MED ORDER — HALOPERIDOL LACTATE 5 MG/ML IJ SOLN
1.0000 mg | Freq: Once | INTRAMUSCULAR | Status: AC
  Administered 2022-07-10: 1 mg via INTRAVENOUS
  Filled 2022-07-10: qty 1

## 2022-07-10 MED ORDER — FENTANYL CITRATE PF 50 MCG/ML IJ SOSY
50.0000 ug | PREFILLED_SYRINGE | INTRAMUSCULAR | Status: DC | PRN
  Administered 2022-07-10 (×2): 50 ug via INTRAVENOUS
  Filled 2022-07-10: qty 1

## 2022-07-10 MED ORDER — LORAZEPAM 2 MG/ML IJ SOLN
2.0000 mg | Freq: Once | INTRAMUSCULAR | Status: AC
  Administered 2022-07-10: 2 mg via INTRAVENOUS
  Filled 2022-07-10: qty 1

## 2022-07-10 MED ORDER — SODIUM CHLORIDE 0.9 % IV SOLN: INTRAVENOUS | Status: AC

## 2022-07-10 MED ORDER — MIDAZOLAM HCL 2 MG/2ML IJ SOLN
6.0000 mg | Freq: Once | INTRAMUSCULAR | Status: AC
  Administered 2022-07-10: 6 mg via INTRAVENOUS

## 2022-07-10 MED ORDER — PHENYLEPHRINE 80 MCG/ML (10ML) SYRINGE FOR IV PUSH (FOR BLOOD PRESSURE SUPPORT)
PREFILLED_SYRINGE | INTRAVENOUS | Status: AC
  Filled 2022-07-10: qty 10

## 2022-07-10 MED ORDER — HALOPERIDOL LACTATE 2 MG/ML PO CONC
1.0000 mg | Freq: Once | ORAL | Status: DC
  Filled 2022-07-10: qty 5

## 2022-07-10 MED ORDER — LACTATED RINGERS IV BOLUS
500.0000 mL | Freq: Once | INTRAVENOUS | Status: AC
  Administered 2022-07-10: 500 mL via INTRAVENOUS

## 2022-07-10 MED ORDER — PANTOPRAZOLE 2 MG/ML SUSPENSION: 40.0000 mg | Freq: Every day | ORAL | Status: DC

## 2022-07-10 MED ORDER — PHENOBARBITAL SODIUM 130 MG/ML IJ SOLN
130.0000 mg | Freq: Two times a day (BID) | INTRAMUSCULAR | Status: DC
  Administered 2022-07-10: 130 mg via INTRAVENOUS
  Filled 2022-07-10: qty 1

## 2022-07-10 MED ORDER — PANTOPRAZOLE SODIUM 40 MG IV SOLR
40.0000 mg | Freq: Every day | INTRAVENOUS | Status: DC
  Administered 2022-07-10 – 2022-07-16 (×7): 40 mg via INTRAVENOUS
  Filled 2022-07-10 (×7): qty 10

## 2022-07-10 MED ORDER — DEXMEDETOMIDINE HCL IN NACL 400 MCG/100ML IV SOLN
0.4000 ug/kg/h | INTRAVENOUS | Status: DC
  Administered 2022-07-10: 0.6 ug/kg/h via INTRAVENOUS
  Administered 2022-07-10: 0.2 ug/kg/h via INTRAVENOUS
  Administered 2022-07-11: 1 ug/kg/h via INTRAVENOUS
  Administered 2022-07-11 (×2): 0.9 ug/kg/h via INTRAVENOUS
  Administered 2022-07-11 (×2): 1 ug/kg/h via INTRAVENOUS
  Administered 2022-07-11 – 2022-07-12 (×2): 0.9 ug/kg/h via INTRAVENOUS
  Administered 2022-07-12: 1.1 ug/kg/h via INTRAVENOUS
  Administered 2022-07-12: 0.9 ug/kg/h via INTRAVENOUS
  Administered 2022-07-12: 0.7 ug/kg/h via INTRAVENOUS
  Administered 2022-07-12 (×2): 1.1 ug/kg/h via INTRAVENOUS
  Administered 2022-07-12: 0.8 ug/kg/h via INTRAVENOUS
  Administered 2022-07-13: 0.6 ug/kg/h via INTRAVENOUS
  Administered 2022-07-13: 0.8 ug/kg/h via INTRAVENOUS
  Administered 2022-07-13: 0.9 ug/kg/h via INTRAVENOUS
  Administered 2022-07-13: 0.8 ug/kg/h via INTRAVENOUS
  Administered 2022-07-13: 0.5 ug/kg/h via INTRAVENOUS
  Administered 2022-07-14: 0.9 ug/kg/h via INTRAVENOUS
  Administered 2022-07-14 (×2): 1 ug/kg/h via INTRAVENOUS
  Administered 2022-07-14: 1.1 ug/kg/h via INTRAVENOUS
  Filled 2022-07-10 (×22): qty 100

## 2022-07-10 MED ORDER — HALOPERIDOL LACTATE 5 MG/ML IJ SOLN
2.0000 mg | Freq: Once | INTRAMUSCULAR | Status: AC
  Administered 2022-07-10: 2 mg via INTRAVENOUS
  Filled 2022-07-10: qty 1

## 2022-07-10 MED ORDER — FENTANYL CITRATE (PF) 100 MCG/2ML IJ SOLN
INTRAMUSCULAR | Status: AC
  Filled 2022-07-10: qty 2

## 2022-07-10 MED ORDER — LORAZEPAM 2 MG/ML IJ SOLN: 1.0000 mg | INTRAMUSCULAR | Status: AC | PRN

## 2022-07-10 MED ORDER — CHLORHEXIDINE GLUCONATE CLOTH 2 % EX PADS
6.0000 | MEDICATED_PAD | Freq: Every day | CUTANEOUS | Status: DC
  Administered 2022-07-10 – 2022-07-18 (×8): 6 via TOPICAL

## 2022-07-10 MED ORDER — HALOPERIDOL LACTATE 5 MG/ML IJ SOLN: 2.0000 mg | Freq: Four times a day (QID) | INTRAMUSCULAR | Status: DC | PRN

## 2022-07-10 MED ORDER — PROPOFOL 1000 MG/100ML IV EMUL
0.0000 ug/kg/min | INTRAVENOUS | Status: DC
  Administered 2022-07-10 – 2022-07-11 (×2): 5 ug/kg/min via INTRAVENOUS
  Administered 2022-07-12 (×2): 50 ug/kg/min via INTRAVENOUS
  Administered 2022-07-12: 15 ug/kg/min via INTRAVENOUS
  Administered 2022-07-12: 50 ug/kg/min via INTRAVENOUS
  Administered 2022-07-12: 25 ug/kg/min via INTRAVENOUS
  Administered 2022-07-12: 50 ug/kg/min via INTRAVENOUS
  Administered 2022-07-13: 40 ug/kg/min via INTRAVENOUS
  Administered 2022-07-13 – 2022-07-14 (×13): 50 ug/kg/min via INTRAVENOUS
  Filled 2022-07-10 (×24): qty 100

## 2022-07-10 MED ORDER — SODIUM CHLORIDE 0.9 % IV SOLN
1.0000 mg | Freq: Once | INTRAVENOUS | Status: AC
  Administered 2022-07-10: 1 mg via INTRAVENOUS
  Filled 2022-07-10: qty 0.2

## 2022-07-10 MED ORDER — LORAZEPAM 2 MG/ML IJ SOLN
0.5000 mg | Freq: Once | INTRAMUSCULAR | Status: AC
Start: 2022-07-10 — End: 2022-07-10
  Administered 2022-07-10: 0.5 mg via INTRAVENOUS
  Filled 2022-07-10: qty 1

## 2022-07-10 MED ORDER — ROCURONIUM BROMIDE 10 MG/ML (PF) SYRINGE: 100.0000 mg | PREFILLED_SYRINGE | Freq: Once | INTRAVENOUS | Status: AC

## 2022-07-10 MED ORDER — THIAMINE HCL 100 MG/ML IJ SOLN
100.0000 mg | Freq: Every day | INTRAMUSCULAR | Status: DC
  Administered 2022-07-10 – 2022-07-19 (×10): 100 mg via INTRAVENOUS
  Filled 2022-07-10 (×10): qty 2

## 2022-07-10 MED ORDER — ROCURONIUM BROMIDE 10 MG/ML (PF) SYRINGE
PREFILLED_SYRINGE | INTRAVENOUS | Status: AC
  Administered 2022-07-10: 100 mg via INTRAVENOUS
  Filled 2022-07-10: qty 10

## 2022-07-10 MED ORDER — ETOMIDATE 2 MG/ML IV SOLN: 20.0000 mg | Freq: Once | INTRAVENOUS | Status: AC

## 2022-07-10 MED ORDER — MIDAZOLAM HCL 2 MG/2ML IJ SOLN
INTRAMUSCULAR | Status: AC
  Filled 2022-07-10: qty 2

## 2022-07-10 MED ORDER — TECHNETIUM TO 99M ALBUMIN AGGREGATED
4.3600 | Freq: Once | INTRAVENOUS | Status: AC
  Administered 2022-07-10: 4.36 via INTRAVENOUS

## 2022-07-10 MED ORDER — FENTANYL CITRATE (PF) 100 MCG/2ML IJ SOLN: 100.0000 ug | Freq: Once | INTRAMUSCULAR | Status: DC

## 2022-07-10 MED ORDER — ACETAMINOPHEN 325 MG PO TABS
325.0000 mg | ORAL_TABLET | Freq: Four times a day (QID) | ORAL | Status: DC | PRN
  Administered 2022-07-10: 650 mg via NASOGASTRIC
  Filled 2022-07-10: qty 2

## 2022-07-10 MED ORDER — MIDAZOLAM HCL 2 MG/2ML IJ SOLN
INTRAMUSCULAR | Status: AC
  Filled 2022-07-10: qty 4

## 2022-07-10 MED ORDER — ETOMIDATE 2 MG/ML IV SOLN
INTRAVENOUS | Status: AC
  Administered 2022-07-10: 20 mg via INTRAVENOUS
  Filled 2022-07-10: qty 20

## 2022-07-10 MED ORDER — LORAZEPAM 1 MG PO TABS: 1.0000 mg | ORAL_TABLET | ORAL | Status: AC | PRN

## 2022-07-10 MED ORDER — FENTANYL CITRATE PF 50 MCG/ML IJ SOSY
50.0000 ug | PREFILLED_SYRINGE | INTRAMUSCULAR | Status: DC | PRN
  Administered 2022-07-10 – 2022-07-11 (×2): 100 ug via INTRAVENOUS
  Filled 2022-07-10 (×3): qty 2

## 2022-07-10 MED ORDER — ORAL CARE MOUTH RINSE
15.0000 mL | OROMUCOSAL | Status: DC
  Administered 2022-07-10 – 2022-07-14 (×46): 15 mL via OROMUCOSAL

## 2022-07-10 MED ORDER — ORAL CARE MOUTH RINSE: 15.0000 mL | OROMUCOSAL | Status: DC | PRN

## 2022-07-10 NOTE — Progress Notes (Addendum)
[facility] PROGRESS NOTE   JAYME CHAM QZE:[id number] DOB: 25-Jan-1966 DOA: 07/07/2022  PCP: Owens Loffler, MD  Brief History/Interval Summary: 56 y.o. male with past medical history of alcohol abuse, gout who had a previous history of diabetes type 2 and hypertension but is no longer on medications after he intentionally lost a significant amount of weight.  Patient was hospitalized for left total knee arthroplasty which was done on 07/07/2022.  Ever since surgery patient has felt more bloated in his abdomen.  Has been passing some gas from below but only after he was given enema.  He has had some nausea but no vomiting.  Also had difficulty breathing without chest pain.  Hospitalist was consulted to assist with management.     Subjective/Interval History: Patient feels more distended this morning.  Abdominal pain has improved.  CT scan findings were reviewed with him.  NG tube was discussed and he is agreeable.  Does admit to more shortness of breath today compared to yesterday.  Denies any chest pain.    Assessment/Plan:  Ileus likely postoperative Significant distention of the bowels were noted including large bowel.  Symptoms have not improved despite laxative and enemas.  Proceed with NG tube placement.  We will see if he improves.  If he does not then may need to involve general surgery.  We will do abdominal films tomorrow.  Acute kidney injury/hyponatremia Presented with normal creatinine.  Yesterday's blood work showed increase in BUN to 32 and creatinine of 2.39.  Likely due to hypovolemia and could also be due to increased intra-abdominal pressure.  Given IV fluid bolus and started on maintenance fluids.  Creatinine improved to 1.53 this morning.  Monitor urine output. Change IV fluids to normal saline.  Dyspnea Chest x-ray did not show any acute findings.  Atelectasis was noted.  Continue with incentive spirometry.   He is requiring oxygen.  Likely has an element  of sleep apnea.  Abdominal distention likely contributing to his symptoms.  D-dimer was noted to be elevated.  VQ scan and lower extremity Dopplers have been ordered.  Tachycardia Appears to be sinus based on EKG.  Likely due to abdominal discomfort.  For now he will be monitored on telemetry.  Status post Left knee replacement Management per orthopedics.  Was started on subcutaneous heparin.  Macrocytosis Check TSH, vitamin A26 folic acid levels.  Obesity Estimated body mass index is 39.17 kg/m as calculated from the following:   Height as of this encounter: '5\' 10"'$  (1.778 m).   Weight as of this encounter: 123.8 kg.  ADDENDUM Called by nursing that the patient was getting agitated.  He was given Ativan with no relief.  Subsequently had to be given Haldol without any change in his agitation.  He was noted to be tachycardic.  Patient seen at bedside.  Thought to be going into alcohol withdrawal.  He does drink heavily at baseline.  He has been here about 4 days now.  He will be transferred to stepdown unit.  Started on Precedex.  Restraints can be used initially.  Discussed with his wife and updated her.  Also discussed with critical care medicine who will consult on this patient.  He is hemodynamically stable for the most part.  Respiratory status is stable with saturations of mid 90s on 4 L of oxygen by nasal cannula.  We will get a chest x-ray as well. Ileus seems to be improving as patient had 3 bowel movements today. We will take over  as primary service. Ortho was informed.  DVT Prophylaxis: Subcutaneous heparin Code Status: Full code Family Communication: Discussed with patient and his wife Disposition Plan: To be determined     Medications: Scheduled:  aspirin  81 mg Oral BID   docusate sodium  100 mg Oral BID   gabapentin  100 mg Oral TID   heparin injection (subcutaneous)  5,000 Units Subcutaneous Q8H   pantoprazole  40 mg Oral Daily   polyethylene glycol  17 g Oral Daily    senna-docusate  1 tablet Oral BID   Continuous:  sodium chloride 125 mL/hr at 07/10/22 0511   methocarbamol (ROBAXIN) IV 500 mg (07/07/22 1020)   ELF:YBOFBPZWCHENI, alum & mag hydroxide-simeth, bisacodyl, diphenhydrAMINE, HYDROmorphone (DILAUDID) injection, labetalol, menthol-cetylpyridinium **OR** phenol, methocarbamol **OR** methocarbamol (ROBAXIN) IV, metoCLOPramide **OR** metoCLOPramide (REGLAN) injection, ondansetron **OR** ondansetron (ZOFRAN) IV, oxyCODONE, oxyCODONE, polyethylene glycol, sodium phosphate  Antibiotics: Anti-infectives (From admission, onward)    Start     Dose/Rate Route Frequency Ordered Stop   07/07/22 1430  ceFAZolin (ANCEF) IVPB 2g/100 mL premix        2 g 200 mL/hr over 30 Minutes Intravenous Every 6 hours 07/07/22 1210 07/07/22 2031   07/07/22 0630  ceFAZolin (ANCEF) IVPB 3g/100 mL premix        3 g 200 mL/hr over 30 Minutes Intravenous On call to O.R. 07/07/22 0621 07/07/22 0835       Objective:  Vital Signs  Vitals:   07/09/22 2258 07/10/22 0051 07/10/22 0316 07/10/22 0658  BP: (!) 137/91 (!) 166/100 (!) 158/114 (!) 142/90  Pulse: (!) 105 (!) 134 (!) 119 (!) 112  Resp: '20 20 18 18  '$ Temp: 98.6 F (37 C) 98.7 F (37.1 C) 97.6 F (36.4 C) 97.9 F (36.6 C)  TempSrc:      SpO2: 92% 96% 98% 98%  Weight:      Height:        Intake/Output Summary (Last 24 hours) at 07/10/2022 0919 Last data filed at 07/10/2022 0900 Gross per 24 hour  Intake 1641.35 ml  Output 425 ml  Net 1216.35 ml   Filed Weights   07/07/22 0656  Weight: 123.8 kg    General appearance: Awake alert.  In no distress Resp: Noted to be tachypneic without any use of accessory muscles.  No definite crackles wheezing or rhonchi. Cardio: S1-S2 is normal regular.  No S3-S4.  No rubs murmurs or bruit GI: Abdomen distended.  Tender to palpate without any rebound rigidity or guarding.  Bowel sounds absent today. Extremities: No edema.  Full range of motion of lower  extremities. Neurologic: Alert and oriented x3.  No focal neurological deficits.    Lab Results:  Data Reviewed: I have personally reviewed following labs and reports of the imaging studies  CBC: Recent Labs  Lab 07/08/22 0242 07/09/22 1818  WBC 12.5* 9.3  HGB 12.2* 13.2  HCT 36.3* 37.9*  MCV 103.1* 102.7*  PLT 271 778    Basic Metabolic Panel: Recent Labs  Lab 07/08/22 0242 07/09/22 1818 07/10/22 0749  NA 135 134* 130*  K 4.6 4.4 4.4  CL 101 95* 95*  CO2 '25 26 23  '$ GLUCOSE 164* 189* 161*  BUN 18 32* 29*  CREATININE 1.19 2.39* 1.53*  CALCIUM 9.2 9.8 8.9    GFR: Estimated Creatinine Clearance: 72 mL/min (A) (by C-G formula based on SCr of 1.53 mg/dL (H)).  Liver Function Tests: Recent Labs  Lab 07/09/22 1818  AST 45*  ALT 23  ALKPHOS 65  BILITOT 2.2*  PROT 8.5*  ALBUMIN 4.2    Recent Labs  Lab 07/09/22 1818  LIPASE 24     CBG: Recent Labs  Lab 07/07/22 1149 07/09/22 1644  GLUCAP 179* 189*     Radiology Studies: CT ABDOMEN PELVIS WO CONTRAST  Result Date: 07/09/2022 CLINICAL DATA:  Abdominal bloating, recent knee surgery 07/07/2022, abdominal tenderness, nausea EXAM: CT ABDOMEN AND PELVIS WITHOUT CONTRAST TECHNIQUE: Multidetector CT imaging of the abdomen and pelvis was performed following the standard protocol without IV contrast. RADIATION DOSE REDUCTION: This exam was performed according to the departmental dose-optimization program which includes automated exposure control, adjustment of the mA and/or kV according to patient size and/or use of iterative reconstruction technique. COMPARISON:  07/09/2022, 08/17/2019 FINDINGS: Lower chest: Hypoventilatory changes are seen at the lung bases. No acute pleural or parenchymal lung disease. Hepatobiliary: Unremarkable unenhanced appearance of the liver and gallbladder. Pancreas: Unremarkable unenhanced appearance. Spleen: Unremarkable unenhanced appearance. Adrenals/Urinary Tract: No urinary tract calculi  or obstructive uropathy within either kidney. The bladder is decompressed, limiting its evaluation. The adrenals are unremarkable. Stomach/Bowel: There is gaseous distension of the large and small bowel to the level of the splenic flexure of the colon, with multiple gas fluid levels. Maximal diameter of the mid jejunum measures approximate 4.5 cm. Overall, I would favor postoperative ileus given recent orthopedic surgery. No bowel wall thickening or inflammatory change. The appendix is surgically absent. Small hiatal hernia. Vascular/Lymphatic: No significant vascular findings are present. No enlarged abdominal or pelvic lymph nodes. Reproductive: Prostate is unremarkable. Other: No free intraperitoneal fluid or free gas. No abdominal wall hernia. Musculoskeletal: There are no acute or destructive bony lesions. Intramuscular and soft tissue edema within the proximal left lower extremity may be related to recent left knee surgery. If there is concern for underlying DVT, Doppler ultrasound could be performed. Reconstructed images demonstrate no additional findings. IMPRESSION: 1. Diffuse gaseous distension of the large and small bowel with numerous gas fluid levels, most compatible with postoperative ileus. Continued radiographic follow-up is recommended. 2. Intramuscular and soft tissue edema within the visualized left lower extremity, which could be related to recent orthopedic surgery. If underlying DVT is suspected, Doppler ultrasound could be performed. 3. Small hiatal hernia. Electronically Signed   By: Randa Ngo M.D.   On: 07/09/2022 22:33   DG CHEST PORT 1 VIEW  Result Date: 07/09/2022 CLINICAL DATA:  Shortness of breath EXAM: PORTABLE CHEST 1 VIEW COMPARISON:  12/12/2021 FINDINGS: Low lung volumes. Bibasilar atelectasis. Heart is normal size. No effusions or acute bony abnormality. IMPRESSION: Low lung volumes with bibasilar atelectasis. Electronically Signed   By: Rolm Baptise M.D.   On: 07/09/2022  18:17   DG Abd Portable 1V  Result Date: 07/09/2022 CLINICAL DATA:  Shortness of breath EXAM: PORTABLE ABDOMEN - 1 VIEW COMPARISON:  None Available. FINDINGS: Dilated bowel appears to mostly represent small-bowel although right colon may also may be dilated. No organomegaly or free air. No visible suspicious calcification. IMPRESSION: Dilated bowel which I favor is a combination of small bowel and right colon. This could reflect ileus although colonic obstruction cannot be excluded. Electronically Signed   By: Rolm Baptise M.D.   On: 07/09/2022 18:16       LOS: 0 days   [facility] Pager on [email or web address]  07/10/2022, 9:19 AM

## 2022-07-10 NOTE — Procedures (Signed)
Intubation Procedure Note  Randy Combs  [id number]  1966/06/08  Date:07/10/22  Time:7:07 PM   Provider Performing:Jeptha Hinnenkamp P Carlis Abbott    Procedure: Intubation (23300)  Indication(s) Respiratory Failure  Consent Unable to obtain consent due to emergent nature of procedure.   Anesthesia Etomidate, Versed, and Rocuronium   Time Out Verified patient identification, verified procedure, site/side was marked, verified correct patient position, special equipment/implants available, medications/allergies/relevant history reviewed, required imaging and test results available.   Sterile Technique Usual hand hygeine, masks, and gloves were used   Procedure Description Patient positioned in bed supine.  Sedation given as noted above.  Patient was intubated with endotracheal tube using Glidescope.  View was Grade 1 full glottis .  Number of attempts was 1.  Colorimetric CO2 detector was consistent with tracheal placement.   Complications/Tolerance None; patient tolerated the procedure well. Chest X-ray is ordered to verify placement.   EBL Minimal   Specimen(s) None   Julian Hy, DO 07/10/22 7:07 PM [facility]

## 2022-07-10 NOTE — Progress Notes (Signed)
Patient transferred to Room #1410. RN Jerene Pitch updated at bedside from previous report. NGT in place to [facility] draining golden secretions. Oxygen in place at 4lpm.   Education provided regarding use of ice man, knee immobilizer, and no pillow under knee during stay on progressive unit. Patient's rolling walker and ice man at bedside for use. Wife and progressive staff verbalize understanding that patient is to take these items home at time of discharge.  Ivan Anchors, RN 07/10/22 9:12 AM

## 2022-07-10 NOTE — Consult Note (Signed)
NAME:  Randy Combs, MRN:  [id number], DOB:  August 07, 1966, LOS: 0 ADMISSION DATE:  07/07/2022, CONSULTATION DATE:  07/10/22 REFERRING MD:  Margarite Gouge, CHIEF COMPLAINT:  ETOH w/d   History of Present Illness:  Randy Combs is a 56 year old gentleman admitted for elective knee surgery on 07/07/2022.  He underwent local, regional, spinal, and general anesthesia for his procedure.  He was admitted overnight for observation.  Unfortunately postoperatively he has had a complicated course with postop ileus and associated distention causing shortness of breath.  Today had an NG tube placed.  Throughout the day today he has developed worsened agitation, tachycardia, restlessness, and tremors.  Although initially he indicated he drink few days per week, family indicated that he drinks more than what he had initially endorsed.  Due to concern for significant alcohol withdrawal and DTs he was transferred to the intensive care unit.  He is unable to receive oral benzodiazepines to help control symptoms due to his ileus.  This evening his main complaint is being thirsty and wanting to get out of restraints.  He has had significant agitation and is only intermittently redirectable.  Pertinent  Medical History  Alcohol abuse Obesity DM 2 Gout Hiatal hernia Hypertension  Significant Hospital Events: Including procedures, antibiotic start and stop dates in addition to other pertinent events   8/4 knee surgery 8/5 abdominal distention 8/6 NG tube placement for gastric decompression 8/7 transfer to ICU, initiated on Precedex and phenobarbital  Interim History / Subjective:    Objective   Blood pressure (!) 190/110, pulse (!) 140, temperature 98.5 F (36.9 C), temperature source Oral, resp. rate (!) 27, height '5\' 10"'$  (1.778 m), weight 123.8 kg, SpO2 97 %.        Intake/Output Summary (Last 24 hours) at 07/10/2022 1817 Last data filed at 07/10/2022 1700 Gross per 24 hour  Intake 1372.49 ml  Output 225 ml   Net 1147.49 ml   Filed Weights   07/07/22 0656  Weight: 123.8 kg    Examination: General: Ill-appearing man sitting up in bed watching TV, intermittently pulling at his restraints and agitated, pulling at paintings and on purposefully HENT: [place]/AT, eyes anicteric Lungs: Faint basilar rales, mild tachypnea, no accessory muscle use.  No conversational dyspnea. Cardiovascular: S1-S2, tachycardic, regular rhythm Abdomen: Distended, no guarding Extremities: Left surgical dressing in place with minimal dried blood beneath the dressing.  TED hose in place Neuro: Awake and alert, agitated, intermittently pulling at restraints, purposely moving all extremities.  Answering questions without appropriate details and not oriented to place, but reasonable answers given his level of agitation. GU: External catheter  CXR personally reviewed-dilated loops of bowel, NG tube in appropriate position.  Possibly basilar atelectasis, no lobar consolidations.  No pleural effusions.  Sodium 130 BUN 29 Creatinine 1.53   Resolved Hospital Problem list     Assessment & Plan:  Acute alcohol withdrawal with agitation, tachycardia, acute metabolic encephalopathy - Started on Precedex drip - Phenobarbital twice daily, can start taper once better controlled - IV CIWA ordered - Continues to require restraints currently to maintain essential devices. -Needs vitamins added when able to take p.o.  Postoperative ileus Hyponatremia - Monitor electrolytes and replete as needed - Maintain euvolemia> saline ordered currently - NG tube to LIWS -Continue to monitor sodium -Continue Reglan, monitor  Acute respiratory failure with hypoxia-concerned that he could have aspirated at some point with his ileus.  Abdominal distention could also be contributing. - Supplemental oxygen as required to maintain SPO2 greater than  90%. - Pulmonary hygiene Continue NG tube to suction  AKI -Strict I's/O - Continue to monitor   -Renally dose meds and avoid nephrotoxic meds -Change fleets to soapsuds enema to reduce risk of renal injury  Osteoarthritis, s/p left TKA - Pain control per orthopedics -Need to watch gabapentin dose with renal function, but no need for renal dose adjustment at this point.  Hyperbilirubinemia, likely due to ileus - Monitor  Best Practice (right click and "Reselect all SmartList Selections" daily)   Diet/type: NPO DVT prophylaxis: prophylactic heparin  GI prophylaxis: N/A Lines: N/A Foley:  N/A Code Status:  full code Last date of multidisciplinary goals of care discussion '[ ]'$   Labs   CBC: Recent Labs  Lab 07/08/22 0242 07/09/22 1818  WBC 12.5* 9.3  HGB 12.2* 13.2  HCT 36.3* 37.9*  MCV 103.1* 102.7*  PLT 271 793    Basic Metabolic Panel: Recent Labs  Lab 07/08/22 0242 07/09/22 1818 07/10/22 0749  NA 135 134* 130*  K 4.6 4.4 4.4  CL 101 95* 95*  CO2 '25 26 23  '$ GLUCOSE 164* 189* 161*  BUN 18 32* 29*  CREATININE 1.19 2.39* 1.53*  CALCIUM 9.2 9.8 8.9   GFR: Estimated Creatinine Clearance: 72 mL/min (A) (by C-G formula based on SCr of 1.53 mg/dL (H)). Recent Labs  Lab 07/08/22 0242 07/09/22 1818  WBC 12.5* 9.3    Liver Function Tests: Recent Labs  Lab 07/09/22 1818  AST 45*  ALT 23  ALKPHOS 65  BILITOT 2.2*  PROT 8.5*  ALBUMIN 4.2   Recent Labs  Lab 07/09/22 1818  LIPASE 24   No results for input(s): "AMMONIA" in the last 168 hours.  ABG No results found for: "PHART", "PCO2ART", "PO2ART", "HCO3", "TCO2", "ACIDBASEDEF", "O2SAT"   Coagulation Profile: No results for input(s): "INR", "PROTIME" in the last 168 hours.  Cardiac Enzymes: No results for input(s): "CKTOTAL", "CKMB", "CKMBINDEX", "TROPONINI" in the last 168 hours.  HbA1C: Hgb A1c MFr Bld  Date/Time Value Ref Range Status  06/30/2022 08:31 AM 6.3 (H) 4.8 - 5.6 % Final    Comment:    (NOTE) Pre diabetes:          5.7%-6.4%  Diabetes:              >6.4%  Glycemic control  for   <7.0% adults with diabetes   03/29/2022 04:51 PM 6.7 (H) 4.6 - 6.5 % Final    Comment:    Glycemic Control Guidelines for People with Diabetes:Non Diabetic:  <6%Goal of Therapy: <7%Additional Action Suggested:  >8%     CBG: Recent Labs  Lab 07/07/22 1149 07/09/22 1644  GLUCAP 179* 189*    Review of Systems:   Limited due to encephalopathy  Past Medical History:  He,  has a past medical history of Alcohol abuse, in remission, Arthritis, Bleeding ulcer, Blood transfusion without reported diagnosis, Glaucoma, Gout of multiple sites (03/01/2015), Hiatal hernia, Hypertension, and Uncontrolled type 2 diabetes mellitus, without long-term current use of insulin (06/12/2021).   Surgical History:   Past Surgical History:  Procedure Laterality Date   APPENDECTOMY  1970's   HERNIA REPAIR     abdominal   TOTAL KNEE ARTHROPLASTY Left 07/07/2022   Procedure: LEFT TOTAL KNEE ARTHROPLASTY;  Surgeon: Mcarthur Rossetti, MD;  Location: [facility]S;  Service: Orthopedics;  Laterality: Left;     Social History:   reports that he quit smoking about 9 years ago. His smoking use included cigars. His smokeless tobacco use includes snuff.  He reports current alcohol use. He reports that he does not use drugs.   Family History:  His family history includes Healthy in his child, child, and father; Lung cancer in his mother. There is no history of Colon cancer or Esophageal cancer. He was adopted.   Allergies No Active Allergies   Home Medications  Prior to Admission medications   Medication Sig Start Date End Date Taking? Authorizing Provider  cyclobenzaprine (FLEXERIL) 10 MG tablet Take 1 tablet (10 mg total) by mouth 3 (three) times daily as needed for muscle spasms. 06/28/22  Yes Mcarthur Rossetti, MD  indomethacin (INDOCIN) 50 MG capsule TAKE 1 CAPSULE BY MOUTH THREE TIMES A DAY AS NEEDED 11/25/21  Yes Copland, Frederico Hamman, MD  omeprazole (PRILOSEC) 20 MG capsule Take 1 capsule (20 mg  total) by mouth daily. 12/16/21  Yes Kate Sable, MD  aspirin 81 MG chewable tablet Chew 1 tablet (81 mg total) by mouth 2 (two) times daily. 07/08/22   Mcarthur Rossetti, MD  colchicine 0.6 MG tablet Take 1 tablet (0.6 mg total) by mouth 2 (two) times daily. Patient not taking: Reported on 06/22/2022 04/03/22   Copland, Frederico Hamman, MD  methocarbamol (ROBAXIN) 500 MG tablet Take 1 tablet (500 mg total) by mouth every 6 (six) hours as needed for muscle spasms. 07/08/22   Mcarthur Rossetti, MD  oxyCODONE (OXY IR/ROXICODONE) 5 MG immediate release tablet Take 1-2 tablets (5-10 mg total) by mouth every 4 (four) hours as needed for moderate pain (pain score 4-6). 07/08/22   Mcarthur Rossetti, MD  traMADol (ULTRAM) 50 MG tablet Take 2 tablets (100 mg total) by mouth every 6 (six) hours as needed. 06/28/22   Mcarthur Rossetti, MD     Critical care time: 45 min.     Julian Hy, DO 07/10/22 6:47 PM [facility]

## 2022-07-10 NOTE — Significant Event (Signed)
Rapid Response Event Note   Reason for Call :  agitation  Initial Focused Assessment:  Patient in bed and agitated. Per chart has history of ETOH use- patient denies at this time. Confirmed with wife but unknown how much or what he drinks.      Interventions:  Tx to SDU     Event Summary:   MD Notified:  Call Time: Arrival Time: End Time:  Josph Macho, RN

## 2022-07-10 NOTE — Progress Notes (Signed)
[id number] Report obtained from off-going RN, Anuja Dahal. During shift change, patient awakens. He is noted to have some dyspnea with conversation. Pupils are pin-point and reactive. Patient appropriate with conversation. MD comes to bedside and explains placement of NGT for abdominal decompression. RN obtains verbal order for placement of telemetry. There are additional orders for labwork. Discussed patient condition with Roj Altamease Oiler, Camera operator. Ivan Anchors, RN 07/10/22 7:42 AM

## 2022-07-10 NOTE — Progress Notes (Addendum)
[facility] Progress Note Patient Name: REYAAN THOMA DOB: May 04, 1966 MRN: [id number]   Date of Service  07/10/2022  HPI/Events of Note  Asked to follow CXR and ABG post intubation   eICU Interventions  CXR reviewed. ETT looks good. No overt infiltrates ABG noted O2 sat is 100 on 100% fio2 and peep is 5. Lower fio2 to keep o2 sat > 90.      Intervention Category Major Interventions: Respiratory failure - evaluation and management  Nolan Lasser G Errin Chewning 07/10/2022, 8:44 PM  Addendum at 9:10 pm Hypotension with SBP 75 Was on propofol as well as precedex drip  RN has turned off propofol and weaned down precedex drip Try LR bolus   Addendum at 3:35 am RN requesting fentanyl drip BP drops while propofol is on and he is needing repeated fentanyl pushes Fentanyl drip ordered

## 2022-07-10 NOTE — Progress Notes (Signed)
   07/10/22 0051  Assess: MEWS Score  Temp 98.7 F (37.1 C)  BP (!) 166/100  MAP (mmHg) 118  Pulse Rate (!) 134  Resp 20  Level of Consciousness Alert  SpO2 96 %  O2 Device Nasal Cannula  O2 Flow Rate (L/min) 4 L/min  Assess: MEWS Score  MEWS Temp 0  MEWS Systolic 0  MEWS Pulse 3  MEWS RR 0  MEWS LOC 0  MEWS Score 3  MEWS Score Color Yellow  Assess: if the MEWS score is Yellow or Red  Were vital signs taken at a resting state? Yes  Focused Assessment Change from prior assessment (see assessment flowsheet) (HR elevated, SOB)  Does the patient have a confirmed or suspected source of infection? No  MEWS guidelines implemented *See Row Information* No, previously yellow, continue vital signs every 4 hours  Treat  MEWS Interventions Escalated (See documentation below)  Pain Scale 0-10  Pain Score 6  Pain Type Acute pain  Pain Location Abdomen  Pain Orientation Left;Upper  Pain Descriptors / Indicators Aching;Constant  Pain Frequency Constant  Pain Onset On-going  Patients Stated Pain Goal 3  Pain Intervention(s) Repositioned;Emotional support  Complains of Shortness of breath  Interventions Other (comment) (O2 increased, sat in chair)  Escalate  MEWS: Escalate Yellow: discuss with charge nurse/RN and consider discussing with provider and RRT  Notify: Charge Nurse/RN  Name of Charge Nurse/RN Notified Kerrie Pleasure  Date Charge Nurse/RN Notified 07/10/22  Time Charge Nurse/RN Notified 0051  Notify: Rapid Response  Name of Rapid Response RN Notified Erin RN  Date Rapid Response Notified 07/10/22  Time Rapid Response Notified 1517  Document  Progress note created (see row info) Yes  Assess: SIRS CRITERIA  SIRS Temperature  0  SIRS Pulse 1  SIRS Respirations  0  SIRS WBC 1  SIRS Score Sum  2   Pt significant other called RN to the room. Pt was short of breath with respirations of 20, sitting at edge of bed. Vitals signs obtained, HR upto 134, saturating at 86% with 2 ltr  [place]. Saturations up to 96 with 4 ltrs.  Agricultural consultant and Rapid response notified. EKG done. Stool softners, laxative and reglan given as ordered. Pt positioned comfortably back on bed. HR down to 110-112. No additional needs at this time. Will continue to monitor.

## 2022-07-10 NOTE — Progress Notes (Signed)
Subjective: 3 Days Post-Op Procedure(s) (LRB): LEFT TOTAL KNEE ARTHROPLASTY (Left) Patient reports pain as moderate.  NG tube being placed this AM.   Objective: Vital signs in last 24 hours: Temp:  [97.6 F (36.4 C)-98.9 F (37.2 C)] 97.9 F (36.6 C) (08/07 0658) Pulse Rate:  [80-134] 112 (08/07 0658) Resp:  [18-20] 18 (08/07 0658) BP: (129-166)/(72-114) 142/90 (08/07 0658) SpO2:  [90 %-99 %] 98 % (08/07 0658)  Intake/Output from previous day: 08/06 0701 - 08/07 0700 In: 1890.9 [P.O.:840; I.V.:1050.9] Out: 900 [Urine:900] Intake/Output this shift: No intake/output data recorded.  Recent Labs    07/08/22 0242 07/09/22 1818  HGB 12.2* 13.2   Recent Labs    07/08/22 0242 07/09/22 1818  WBC 12.5* 9.3  RBC 3.52* 3.69*  HCT 36.3* 37.9*  PLT 271 314   Recent Labs    07/08/22 0242 07/09/22 1818  NA 135 134*  K 4.6 4.4  CL 101 95*  CO2 25 26  BUN 18 32*  CREATININE 1.19 2.39*  GLUCOSE 164* 189*  CALCIUM 9.2 9.8   No results for input(s): "LABPT", "INR" in the last 72 hours. General : Awake and alert.   Left lower extremity: Dorsiflexion/Plantar flexion intact Incision: scant drainage   Assessment/Plan: 3 Days Post-Op Procedure(s) (LRB): LEFT TOTAL KNEE ARTHROPLASTY (Left) Ileus - NG tube, NPO DVT prophylaxis on ASA 81 mg BID. Ok to change to Lovenox.  Doppler lower extremities pending.   Transfer to telemetry floor later today for closer monitoring.  Labs pending Appreciate medicine helping to care for this patient.     Logan [place] 07/10/2022, 8:24 AM

## 2022-07-10 NOTE — Progress Notes (Signed)
Physical Therapy Treatment Patient Details Name: Randy Combs MRN: [id number] DOB: [birth year] Today's Date: 07/10/2022   History of Present Illness 56 yo male S/P LTKA 07/07/22.Marland Kitchen PMH: gout, DM, glaucoma,    PT Comments    Pt sitting EOB on arrival with lines tought.  Assisted with line management.  Pt ambulated short distance in hallway limited by distance limited by pain, SOB and elevated HR.  Once pt's lines untangled, pt rushing to perform ambulation so did not ambulate on O2 (not able to grab tank fast enough) however reapplied 5L O2 [place] upon return to room, and SPO2 reading 96%, HR up to 147 bpm during ambulation (RN notified).  Pt assisted back to bed.   Recommendations for follow up therapy are one component of a multi-disciplinary discharge planning process, led by the attending physician.  Recommendations may be updated based on patient status, additional functional criteria and insurance authorization.  Follow Up Recommendations  Follow physician's recommendations for discharge plan and follow up therapies     Assistance Recommended at Discharge Intermittent Supervision/Assistance  Patient can return home with the following A little help with walking and/or transfers;Assistance with cooking/housework;Assist for transportation;Help with stairs or ramp for entrance;A little help with bathing/dressing/bathroom   Equipment Recommendations  None recommended by PT    Recommendations for Other Services       Precautions / Restrictions Precautions Precautions: Fall;Knee Precaution Comments: no pillow under knee, NGT, currently on 5L O2 [place] Restrictions LLE Weight Bearing: Weight bearing as tolerated     Mobility  Bed Mobility Overal bed mobility: Needs Assistance Bed Mobility: Sit to Supine       Sit to supine: Min assist   General bed mobility comments: spouse provided assist for LE, therapist assisted with managing lines    Transfers Overall transfer level: Needs  assistance Equipment used: Rolling walker (2 wheels) Transfers: Sit to/from Stand Sit to Stand: Min guard           General transfer comment: verbal cues for safe technique    Ambulation/Gait Ambulation/Gait assistance: Min guard Gait Distance (Feet): 60 Feet Assistive device: Rolling walker (2 wheels) Gait Pattern/deviations: Step-to pattern, Decreased stance time - left, Antalgic, Knee flexed in stance - left       General Gait Details: very antalgic gait, distance limited by pain, SOB and elevated HR; pt rushing to perform ambulation so did not ambulate on O2 (not able to grab tank fast enough) however reapplied 5L O2 [place] upon return to room and SPO2 reading 96%, HR up to 147 bpm during ambulation Investment banker, corporate notified)   Stairs             Wheelchair Mobility    Modified Rankin (Stroke Patients Only)       Balance                                            Cognition Arousal/Alertness: Awake/alert Behavior During Therapy: Restless, Anxious Overall Cognitive Status: Within Functional Limits for tasks assessed                                 General Comments: sitting EOB on arrival, lines tought, pt appears restless/anxious        Exercises      General Comments        Pertinent  Vitals/Pain Pain Assessment Pain Assessment: 0-10 Pain Score: 7  Pain Location: L knee Pain Descriptors / Indicators: Grimacing, Discomfort Pain Intervention(s): Monitored during session, Repositioned    Home Living                          Prior Function            PT Goals (current goals can now be found in the care plan section) Progress towards PT goals: Progressing toward goals    Frequency    7X/week      PT Plan Current plan remains appropriate    Co-evaluation              AM-PAC PT "6 Clicks" Mobility   Outcome Measure  Help needed turning from your back to your side while in a flat bed without using  bedrails?: A Little Help needed moving from lying on your back to sitting on the side of a flat bed without using bedrails?: A Little Help needed moving to and from a bed to a chair (including a wheelchair)?: A Little Help needed standing up from a chair using your arms (e.g., wheelchair or bedside chair)?: A Little Help needed to walk in hospital room?: A Lot Help needed climbing 3-5 steps with a railing? : A Lot 6 Click Score: 16    End of Session Equipment Utilized During Treatment: Gait belt;Oxygen Activity Tolerance: Patient limited by fatigue Patient left: in bed;with call bell/phone within reach;with family/visitor present Nurse Communication: Mobility status PT Visit Diagnosis: Difficulty in walking, not elsewhere classified (R26.2);Unsteadiness on feet (R26.81)     Time: 5093-2671 PT Time Calculation (min) (ACUTE ONLY): 20 min  Charges:  $Gait Training: 8-22 mins                    Jannette Spanner PT, DPT Physical Therapist Acute Rehabilitation Services Preferred contact method: Secure Chat Weekend Pager Only: [phone] Office: [place] 07/10/2022, 4:07 PM

## 2022-07-10 NOTE — Plan of Care (Signed)
  Problem: Coping: Goal: Level of anxiety will decrease Outcome: Progressing   Problem: Elimination: Goal: Will not experience complications related to bowel motility Outcome: Progressing Goal: Will not experience complications related to urinary retention Outcome: Progressing   Problem: Pain Managment: Goal: General experience of comfort will improve Outcome: Progressing   

## 2022-07-10 NOTE — Progress Notes (Signed)
PT Cancellation Note  Patient Details Name: Randy Combs MRN: [id number] DOB: 1966-03-25   Cancelled Treatment:    Reason Eval/Treat Not Completed: Medical issues which prohibited therapy Pt transferred this morning and to have tests/procedures.  Will check back as schedule permits.   Myrtis Hopping [place] 07/10/2022, 11:16 AM Arlyce Dice, DPT Physical Therapist Acute Rehabilitation Services Preferred contact method: Secure Chat Weekend Pager Only: [phone] Office: [phone]

## 2022-07-10 NOTE — Progress Notes (Signed)
   07/10/22 0400  Provider Notification  Provider Name/Title K. foust  Date Provider Notified 07/10/22  Time Provider Notified [phone]  Method of Notification Page (secure chat)  Notification Reason Other (Comment) (pt and family concern for abdominal distention and Short of Breath)  Provider response At bedside;No new orders  Date of Provider Response 07/10/22  Time of Provider Response 0415   Pt and family concerned about abdominal distention,  SOB and O2 saturations fluctuating with pt movement (saturation down to 84 with 4 ltr [place] with pt movement). Pt resting upon arrival of on -call. Discussed pt condition with pt and family. No any pt needs  at this time. Will continue to monitor.

## 2022-07-10 NOTE — Progress Notes (Addendum)
Patient was anxious intermittently upon arrival to 4E, tachycardia 100-120bpm. However his anxiety transitioned into an agitated state & he began attempting to pull out NGT, tele & repeatedly trying to get out of bed. IV ativan made agitation much worse & patient became intermittently confused as well. HR was sustaining 140-145, tachypneic & worsening agitation. Charge RN & rapid response RN came to bedside as well as MD [place]. Patient was then transferred to ICU for closer monitoring. Wife was here at hospital & aware of escalation in care.

## 2022-07-10 NOTE — Progress Notes (Signed)
Bilateral lower extremity venous duplex has been completed. Preliminary results can be found in CV Proc through chart review.   07/10/22 9:21 AM Carlos Levering RVT

## 2022-07-10 NOTE — Progress Notes (Signed)
Called to bedside around 6:50 due to respiratory decompensation. He was gasping for air and was speaking only one word at a time. He is increasingly confused and intermittently having sonorous respirations. O2 requirements had increased to 6L since admission to the ICU from 4L. Decision made to intubate. Wife and daughter called to update this evening.   Elink to follow up on ABG and CXR.  Julian Hy, DO 07/10/22 7:16 PM [facility]

## 2022-07-11 ENCOUNTER — Observation Stay (HOSPITAL_COMMUNITY): Payer: 59

## 2022-07-11 ENCOUNTER — Inpatient Hospital Stay (HOSPITAL_COMMUNITY): Payer: 59

## 2022-07-11 DIAGNOSIS — G928 Other toxic encephalopathy: Secondary | ICD-10-CM | POA: Diagnosis not present

## 2022-07-11 DIAGNOSIS — I82612 Acute embolism and thrombosis of superficial veins of left upper extremity: Secondary | ICD-10-CM | POA: Diagnosis not present

## 2022-07-11 DIAGNOSIS — R609 Edema, unspecified: Secondary | ICD-10-CM | POA: Diagnosis not present

## 2022-07-11 DIAGNOSIS — G9341 Metabolic encephalopathy: Secondary | ICD-10-CM | POA: Diagnosis not present

## 2022-07-11 DIAGNOSIS — K567 Ileus, unspecified: Secondary | ICD-10-CM | POA: Diagnosis present

## 2022-07-11 DIAGNOSIS — E1165 Type 2 diabetes mellitus with hyperglycemia: Secondary | ICD-10-CM | POA: Diagnosis not present

## 2022-07-11 DIAGNOSIS — E871 Hypo-osmolality and hyponatremia: Secondary | ICD-10-CM | POA: Diagnosis not present

## 2022-07-11 DIAGNOSIS — Z6841 Body Mass Index (BMI) 40.0 and over, adult: Secondary | ICD-10-CM | POA: Diagnosis not present

## 2022-07-11 DIAGNOSIS — M1712 Unilateral primary osteoarthritis, left knee: Secondary | ICD-10-CM | POA: Diagnosis present

## 2022-07-11 DIAGNOSIS — F10139 Alcohol abuse with withdrawal, unspecified: Secondary | ICD-10-CM | POA: Diagnosis not present

## 2022-07-11 DIAGNOSIS — D62 Acute posthemorrhagic anemia: Secondary | ICD-10-CM | POA: Diagnosis not present

## 2022-07-11 DIAGNOSIS — F10931 Alcohol use, unspecified with withdrawal delirium: Secondary | ICD-10-CM | POA: Diagnosis not present

## 2022-07-11 DIAGNOSIS — I160 Hypertensive urgency: Secondary | ICD-10-CM | POA: Diagnosis not present

## 2022-07-11 DIAGNOSIS — D509 Iron deficiency anemia, unspecified: Secondary | ICD-10-CM | POA: Diagnosis not present

## 2022-07-11 DIAGNOSIS — J9601 Acute respiratory failure with hypoxia: Secondary | ICD-10-CM | POA: Diagnosis not present

## 2022-07-11 DIAGNOSIS — Z96652 Presence of left artificial knee joint: Secondary | ICD-10-CM | POA: Diagnosis not present

## 2022-07-11 DIAGNOSIS — I10 Essential (primary) hypertension: Secondary | ICD-10-CM | POA: Diagnosis present

## 2022-07-11 DIAGNOSIS — K9189 Other postprocedural complications and disorders of digestive system: Secondary | ICD-10-CM | POA: Diagnosis not present

## 2022-07-11 DIAGNOSIS — I959 Hypotension, unspecified: Secondary | ICD-10-CM | POA: Diagnosis not present

## 2022-07-11 DIAGNOSIS — R0602 Shortness of breath: Secondary | ICD-10-CM | POA: Diagnosis present

## 2022-07-11 DIAGNOSIS — Z801 Family history of malignant neoplasm of trachea, bronchus and lung: Secondary | ICD-10-CM | POA: Diagnosis not present

## 2022-07-11 DIAGNOSIS — Z9911 Dependence on respirator [ventilator] status: Secondary | ICD-10-CM | POA: Diagnosis not present

## 2022-07-11 DIAGNOSIS — K625 Hemorrhage of anus and rectum: Secondary | ICD-10-CM | POA: Diagnosis not present

## 2022-07-11 DIAGNOSIS — N179 Acute kidney failure, unspecified: Secondary | ICD-10-CM | POA: Diagnosis not present

## 2022-07-11 DIAGNOSIS — J690 Pneumonitis due to inhalation of food and vomit: Secondary | ICD-10-CM | POA: Diagnosis not present

## 2022-07-11 LAB — RETICULOCYTES
Immature Retic Fract: 24.2 % — ABNORMAL HIGH (ref 2.3–15.9)
RBC.: 2.92 MIL/uL — ABNORMAL LOW (ref 4.22–5.81)
Retic Count, Absolute: 101.9 10*3/uL (ref 19.0–186.0)
Retic Ct Pct: 3.5 % — ABNORMAL HIGH (ref 0.4–3.1)

## 2022-07-11 LAB — FERRITIN: Ferritin: 470 ng/mL — ABNORMAL HIGH (ref 24–336)

## 2022-07-11 LAB — URINALYSIS, ROUTINE W REFLEX MICROSCOPIC
Glucose, UA: NEGATIVE mg/dL
Hgb urine dipstick: NEGATIVE
Ketones, ur: NEGATIVE mg/dL
Leukocytes,Ua: NEGATIVE
Nitrite: NEGATIVE
Protein, ur: 30 mg/dL — AB
Specific Gravity, Urine: 1.029 (ref 1.005–1.030)
pH: 5 (ref 5.0–8.0)

## 2022-07-11 LAB — GLUCOSE, CAPILLARY
Glucose-Capillary: 108 mg/dL — ABNORMAL HIGH (ref 70–99)
Glucose-Capillary: 114 mg/dL — ABNORMAL HIGH (ref 70–99)
Glucose-Capillary: 122 mg/dL — ABNORMAL HIGH (ref 70–99)
Glucose-Capillary: 136 mg/dL — ABNORMAL HIGH (ref 70–99)
Glucose-Capillary: 143 mg/dL — ABNORMAL HIGH (ref 70–99)
Glucose-Capillary: 148 mg/dL — ABNORMAL HIGH (ref 70–99)

## 2022-07-11 LAB — VITAMIN B12: Vitamin B-12: 160 pg/mL — ABNORMAL LOW (ref 180–914)

## 2022-07-11 LAB — BLOOD GAS, ARTERIAL
Acid-Base Excess: 0.3 mmol/L (ref 0.0–2.0)
Bicarbonate: 25.4 mmol/L (ref 20.0–28.0)
Drawn by: 30860
FIO2: 50 %
MECHVT: 580 mL
O2 Saturation: 100 %
PEEP: 5 cmH2O
Patient temperature: 37.3
RATE: 20 resp/min
pCO2 arterial: 43 mmHg (ref 32–48)
pH, Arterial: 7.39 (ref 7.35–7.45)
pO2, Arterial: 92 mmHg (ref 83–108)

## 2022-07-11 LAB — BASIC METABOLIC PANEL
Anion gap: 9 (ref 5–15)
BUN: 28 mg/dL — ABNORMAL HIGH (ref 6–20)
CO2: 26 mmol/L (ref 22–32)
Calcium: 8.6 mg/dL — ABNORMAL LOW (ref 8.9–10.3)
Chloride: 97 mmol/L — ABNORMAL LOW (ref 98–111)
Creatinine, Ser: 1.8 mg/dL — ABNORMAL HIGH (ref 0.61–1.24)
GFR, Estimated: 44 mL/min — ABNORMAL LOW (ref >60)
Glucose, Bld: 159 mg/dL — ABNORMAL HIGH (ref 70–99)
Potassium: 4.2 mmol/L (ref 3.5–5.1)
Sodium: 132 mmol/L — ABNORMAL LOW (ref 135–145)

## 2022-07-11 LAB — HEPATIC FUNCTION PANEL
ALT: 22 U/L (ref 0–44)
AST: 31 U/L (ref 15–41)
Albumin: 3 g/dL — ABNORMAL LOW (ref 3.5–5.0)
Alkaline Phosphatase: 71 U/L (ref 38–126)
Bilirubin, Direct: 0.5 mg/dL — ABNORMAL HIGH (ref 0.0–0.2)
Indirect Bilirubin: 1.2 mg/dL — ABNORMAL HIGH (ref 0.3–0.9)
Total Bilirubin: 1.7 mg/dL — ABNORMAL HIGH (ref 0.3–1.2)
Total Protein: 6.7 g/dL (ref 6.5–8.1)

## 2022-07-11 LAB — CBC
HCT: 30.2 % — ABNORMAL LOW (ref 39.0–52.0)
Hemoglobin: 10.3 g/dL — ABNORMAL LOW (ref 13.0–17.0)
MCH: 35.3 pg — ABNORMAL HIGH (ref 26.0–34.0)
MCHC: 34.1 g/dL (ref 30.0–36.0)
MCV: 103.4 fL — ABNORMAL HIGH (ref 80.0–100.0)
Platelets: 244 10*3/uL (ref 150–400)
RBC: 2.92 MIL/uL — ABNORMAL LOW (ref 4.22–5.81)
RDW: 14.1 % (ref 11.5–15.5)
WBC: 2.9 10*3/uL — ABNORMAL LOW (ref 4.0–10.5)
nRBC: 0 % (ref 0.0–0.2)

## 2022-07-11 LAB — PHOSPHORUS: Phosphorus: 2.2 mg/dL — ABNORMAL LOW (ref 2.5–4.6)

## 2022-07-11 LAB — T4, FREE: Free T4: 1.05 ng/dL (ref 0.61–1.12)

## 2022-07-11 LAB — LACTIC ACID, PLASMA
Lactic Acid, Venous: 1.9 mmol/L (ref 0.5–1.9)
Lactic Acid, Venous: 1.6 mmol/L (ref 0.5–1.9)

## 2022-07-11 LAB — IRON AND TIBC
Iron: 29 ug/dL — ABNORMAL LOW (ref 45–182)
Saturation Ratios: 12 % — ABNORMAL LOW (ref 17.9–39.5)
TIBC: 238 ug/dL — ABNORMAL LOW (ref 250–450)
UIBC: 209 ug/dL

## 2022-07-11 LAB — TRIGLYCERIDES: Triglycerides: 142 mg/dL (ref <150)

## 2022-07-11 LAB — TSH: TSH: 0.897 u[IU]/mL (ref 0.350–4.500)

## 2022-07-11 LAB — FOLATE: Folate: 8.2 ng/mL (ref >5.9)

## 2022-07-11 LAB — MAGNESIUM: Magnesium: 2.1 mg/dL (ref 1.7–2.4)

## 2022-07-11 MED ORDER — FENTANYL CITRATE PF 50 MCG/ML IJ SOSY
50.0000 ug | PREFILLED_SYRINGE | INTRAMUSCULAR | Status: DC | PRN
  Administered 2022-07-11: 50 ug via INTRAVENOUS
  Filled 2022-07-11: qty 1

## 2022-07-11 MED ORDER — NOREPINEPHRINE 4 MG/250ML-% IV SOLN
2.0000 ug/min | INTRAVENOUS | Status: DC
  Administered 2022-07-13: 2 ug/min via INTRAVENOUS
  Filled 2022-07-11: qty 250

## 2022-07-11 MED ORDER — VANCOMYCIN HCL 1250 MG/250ML IV SOLN
1250.0000 mg | INTRAVENOUS | Status: DC
  Filled 2022-07-11: qty 250

## 2022-07-11 MED ORDER — FENTANYL 2500MCG IN NS 250ML (10MCG/ML) PREMIX INFUSION
50.0000 ug/h | INTRAVENOUS | Status: DC
  Administered 2022-07-11: 50 ug/h via INTRAVENOUS
  Filled 2022-07-11 (×2): qty 250

## 2022-07-11 MED ORDER — FENTANYL CITRATE PF 50 MCG/ML IJ SOSY
50.0000 ug | PREFILLED_SYRINGE | INTRAMUSCULAR | Status: DC | PRN
  Administered 2022-07-11: 100 ug via INTRAVENOUS
  Administered 2022-07-11: 50 ug via INTRAVENOUS
  Administered 2022-07-11 – 2022-07-13 (×8): 100 ug via INTRAVENOUS
  Filled 2022-07-11 (×10): qty 2

## 2022-07-11 MED ORDER — DOCUSATE SODIUM 50 MG/5ML PO LIQD
100.0000 mg | Freq: Two times a day (BID) | ORAL | Status: DC
  Administered 2022-07-11: 100 mg
  Filled 2022-07-11: qty 10

## 2022-07-11 MED ORDER — FENTANYL BOLUS VIA INFUSION: 50.0000 ug | INTRAVENOUS | Status: DC | PRN

## 2022-07-11 MED ORDER — SODIUM CHLORIDE 0.9 % IV SOLN
2.0000 g | Freq: Three times a day (TID) | INTRAVENOUS | Status: DC
  Administered 2022-07-11 (×3): 2 g via INTRAVENOUS
  Filled 2022-07-11 (×3): qty 12.5

## 2022-07-11 MED ORDER — LACTATED RINGERS IV BOLUS
1000.0000 mL | Freq: Once | INTRAVENOUS | Status: AC
  Administered 2022-07-11: 1000 mL via INTRAVENOUS

## 2022-07-11 MED ORDER — INSULIN ASPART 100 UNIT/ML IJ SOLN
1.0000 [IU] | INTRAMUSCULAR | Status: DC
  Administered 2022-07-11 – 2022-07-16 (×15): 1 [IU] via SUBCUTANEOUS
  Administered 2022-07-16: 2 [IU] via SUBCUTANEOUS
  Administered 2022-07-16 (×3): 1 [IU] via SUBCUTANEOUS
  Administered 2022-07-16: 2 [IU] via SUBCUTANEOUS
  Administered 2022-07-17 (×3): 1 [IU] via SUBCUTANEOUS

## 2022-07-11 MED ORDER — LACTATED RINGERS IV BOLUS
500.0000 mL | Freq: Once | INTRAVENOUS | Status: AC
Start: 2022-07-11 — End: 2022-07-12
  Administered 2022-07-11: 1000 mL via INTRAVENOUS

## 2022-07-11 MED ORDER — SODIUM PHOSPHATES 45 MMOLE/15ML IV SOLN
20.0000 mmol | Freq: Once | INTRAVENOUS | Status: AC
  Administered 2022-07-11: 20 mmol via INTRAVENOUS
  Filled 2022-07-11: qty 6.67

## 2022-07-11 MED ORDER — SODIUM CHLORIDE 0.9 % IV SOLN: 250.0000 mL | INTRAVENOUS | Status: DC

## 2022-07-11 MED ORDER — SODIUM CHLORIDE 0.9 % IV SOLN: INTRAVENOUS | Status: DC | PRN

## 2022-07-11 MED ORDER — VANCOMYCIN HCL 2000 MG/400ML IV SOLN
2000.0000 mg | Freq: Once | INTRAVENOUS | Status: AC
  Administered 2022-07-11: 2000 mg via INTRAVENOUS
  Filled 2022-07-11: qty 400

## 2022-07-11 MED ORDER — SODIUM CHLORIDE 0.9 % IV SOLN: INTRAVENOUS | Status: AC

## 2022-07-11 MED ORDER — POLYETHYLENE GLYCOL 3350 17 G PO PACK
17.0000 g | PACK | Freq: Every day | ORAL | Status: DC
  Administered 2022-07-11: 17 g
  Filled 2022-07-11: qty 1

## 2022-07-11 MED ORDER — SODIUM CHLORIDE 0.9 % IV SOLN
2.0000 mg/kg/h | INTRAVENOUS | Status: DC
  Administered 2022-07-11 (×3): 1 mg/kg/h via INTRAVENOUS
  Administered 2022-07-12: 2 mg/kg/h via INTRAVENOUS
  Administered 2022-07-12 (×4): 1 mg/kg/h via INTRAVENOUS
  Filled 2022-07-11 (×10): qty 10

## 2022-07-11 MED ORDER — ACETAMINOPHEN 650 MG RE SUPP
650.0000 mg | Freq: Four times a day (QID) | RECTAL | Status: DC | PRN
Start: 2022-07-11 — End: 2022-07-17

## 2022-07-11 MED ORDER — FENTANYL CITRATE PF 50 MCG/ML IJ SOSY: 50.0000 ug | PREFILLED_SYRINGE | Freq: Once | INTRAMUSCULAR | Status: DC

## 2022-07-11 MED ORDER — METHYLNALTREXONE BROMIDE 12 MG/0.6ML ~~LOC~~ SOLN
12.0000 mg | Freq: Once | SUBCUTANEOUS | Status: AC
  Administered 2022-07-11: 12 mg via SUBCUTANEOUS
  Filled 2022-07-11: qty 0.6

## 2022-07-11 NOTE — Progress Notes (Signed)
An USGPIV (ultrasound guided PIV) has been placed for short-term vasopressor infusion. A correctly placed ivWatch must be used when administering Vasopressors. Should this treatment be needed beyond 72 hours, central line access should be obtained.  It will be the responsibility of the bedside nurse to follow best practice to prevent extravasations.   ?

## 2022-07-11 NOTE — Progress Notes (Signed)
PCCM Progress Note   Verbally spoke with General Surgery over the phone and no additional interventions seen for management of Ileus at this time. Recommended continued medical management with recommendations to keep Mg greater than 2 and Potassium greater than 4.  Diavian Furgason D. Kenton Kingfisher, NP-C [facility] Personal contact information can be found on Amion  07/11/2022, 4:06 PM

## 2022-07-11 NOTE — Progress Notes (Signed)
PT Cancellation Note  Patient Details Name: Randy Combs MRN: [id number] DOB: Dec 22, 1965   Cancelled Treatment:    Reason Eval/Treat Not Completed: Medical issues which prohibited therapy, has been intubated. [place] Office [phone] Weekend [phone]    Claretha Cooper 07/11/2022, 7:23 AM

## 2022-07-11 NOTE — Plan of Care (Signed)
  Problem: Coping: Goal: Level of anxiety will decrease Outcome: Progressing   Problem: Pain Managment: Goal: General experience of comfort will improve Outcome: Progressing   

## 2022-07-11 NOTE — Progress Notes (Signed)
Initial Nutrition Assessment  DOCUMENTATION CODES:   Obesity unspecified  INTERVENTION:  - will monitor for plan concerning nutrition.   NUTRITION DIAGNOSIS:   Inadequate oral intake related to inability to eat as evidenced by NPO status.  GOAL:   Provide needs based on ASPEN/SCCM guidelines  MONITOR:   Vent status, Labs, Weight trends, I & O's  REASON FOR ASSESSMENT:   Ventilator  ASSESSMENT:   56 y.o. male with medical history of alcohol abuse, gout, HTN, hiatal hernia, glaucoma, uncontrolled type 2 DM, and arthritis. He has had pain to L knee area with associated functional disability d/t arthritis. He failed non-surgical management for >12 weeks. He presented to the hospital for planned L knee total arthroplasty.  Patient is POD #4 L knee total arthroplasty. He was transferred from 4W to 2W and subsequently intubated d/t respiratory distress. NGT placed in L nare yesterday AM.  Patient discussed in rounds this AM. NGT required advancement and is at the 53 cm marking and confirmed by KUB this AM (gastric). He has 300 ml very dark green output in canister.   No visitors present at the time of RD visit. He has not been seen by a [place] RD at any time in the past.  Able to talk with CCM MD prior to rounds. Ileus is likely a primary driving factor in current medical condition. Tube feeding not appropriate at this time.  Per discussion in rounds, plan for addition of ketamine drip. Patient having DTs.   Weight on 8/4 was 273 lb and weight has been mainly stable since January.    Patient is currently intubated on ventilator support MV: 11.2 L/min Temp (24hrs), Avg:99.5 F (37.5 C), Min:97.8 F (36.6 C), Max:101.7 F (38.7 C) Propofol: 11.1 ml/hr (293 kcal/24 hrs) BP: 97/60 and MAP: 70  Labs reviewed; CBGs: 136 and 148 mg/dl, Na: 132 mmol/l, Cl: 97 mmol/l, BUN: 28 mg/dl, creatinine: 1.8 mg/dl, Ca: 8.6 mg/dl, Phos: 2.2 mg/dl, GFR: 44 ml/min.  Medications  reviewed; 100 mg colace BID, 1 mg folic acid x1 run 8/7, 40 mg IV protonix/day, 17 g miralax/day, 20 mmol IV NaPhos x1 run 8/8, 100 mg IV thiamine/day.     NUTRITION - FOCUSED PHYSICAL EXAM:  Flowsheet Row Most Recent Value  Orbital Region Unable to assess  [ETT holder]  Upper Arm Region No depletion  Thoracic and Lumbar Region No depletion  Buccal Region Unable to assess  [ETT holder]  Temple Region No depletion  Clavicle Bone Region No depletion  Clavicle and Acromion Bone Region No depletion  Scapular Bone Region No depletion  Dorsal Hand No depletion  Patellar Region No depletion  Anterior Thigh Region No depletion  Posterior Calf Region No depletion  Edema (RD Assessment) Mild  [all extremities]  Hair Reviewed  Eyes Unable to assess  Mouth Unable to assess  Skin Reviewed  Nails Reviewed       Diet Order:   Diet Order             Diet NPO time specified  Diet effective now                   EDUCATION NEEDS:   No education needs have been identified at this time  Skin:  Skin Assessment: Skin Integrity Issues: Skin Integrity Issues:: Incisions Incisions: L knee (8/4)  Last BM:  8/7 (type 6 x1, large amount)  Height:   Ht Readings from Last 1 Encounters:  07/07/22 '5\' 10"'$  (1.778 m)  Weight:   Wt Readings from Last 1 Encounters:  07/07/22 123.8 kg     BMI:  Body mass index is 39.17 kg/m.  Estimated Nutritional Needs:  Kcal:  2082 kcal Protein:  >/= 151 grams Fluid:  >/= 2.2 L/day     Jarome Matin, MS, RD, LDN, CNSC Registered Dietitian II Inpatient Clinical Nutrition RD pager # and on-call/weekend pager # available in [facility]

## 2022-07-11 NOTE — Progress Notes (Signed)
Subjective: 4 Days Post-Op Procedure(s) (LRB): LEFT TOTAL KNEE ARTHROPLASTY (Left) Patient intubated. Currently resting. Had to be restrained yesterday due to agitation and confusion. Intubated due to respiratory failure.   Objective: Vital signs in last 24 hours: Temp:  [97.8 F (36.6 C)-101.7 F (38.7 C)] 99.1 F (37.3 C) (08/08 0400) Pulse Rate:  [71-140] 74 (08/08 0700) Resp:  [11-36] 20 (08/08 0700) BP: (78-190)/(47-112) 131/78 (08/08 0700) SpO2:  [95 %-100 %] 100 % (08/08 0700) FiO2 (%):  [50 %-100 %] 50 % (08/08 0339)  Intake/Output from previous day: 08/07 0701 - 08/08 0700 In: 2175.9 [I.V.:2134.1; IV Piggyback:41.7] Out: 825 [Urine:300; Emesis/NG output:525] Intake/Output this shift: No intake/output data recorded.  Recent Labs    07/09/22 1818 07/11/22 0214  HGB 13.2 10.3*   Recent Labs    07/09/22 1818 07/11/22 0214  WBC 9.3 2.9*  RBC 3.69* 2.92*  2.92*  HCT 37.9* 30.2*  PLT 314 244   Recent Labs    07/10/22 0749 07/11/22 0214  NA 130* 132*  K 4.4 4.2  CL 95* 97*  CO2 23 26  BUN 29* 28*  CREATININE 1.53* 1.80*  GLUCOSE 161* 159*  CALCIUM 8.9 8.6*   No results for input(s): "LABPT", "INR" in the last 72 hours.  Left lower extremity: Incision: scant drainage Left lower leg compartments supple.   Assessment/Plan: 4 Days Post-Op Procedure(s) (LRB): LEFT TOTAL KNEE ARTHROPLASTY (Left) Appreciate critical care team assuming medical care of patient.  Ortho will continue to follow throughout admission.  Float heels as much as possible to prevent heel break down and keep left knee in full extension as much as possible.  Will order CPM to help with motion as patient unable to participate in PT at this time.        Randy Combs 07/11/2022, 7:43 AM

## 2022-07-11 NOTE — Progress Notes (Signed)
NG tube was advanced 10 cm this am per order. At 1500 the Nurse noticed bile contents coming from patients mouth, NG tube pinched internally. Nurse contacted charge nurse for assistance. Multiple attempts to place OG and NG with no success. NG tube kept coiling and meeting resistance. Patient has history of hiatal hernia surgery. CCM aware.

## 2022-07-11 NOTE — Progress Notes (Signed)
NAME:  Randy Combs, MRN:  [id number], DOB:  1966/01/13, LOS: 0 ADMISSION DATE:  07/07/2022, CONSULTATION DATE:  07/10/22 REFERRING MD:  Margarite Gouge, CHIEF COMPLAINT:  ETOH w/d   History of Present Illness:  Mr. Randy Combs is a 56 year old gentleman admitted for elective knee surgery on 07/07/2022.  He underwent local, regional, spinal, and general anesthesia for his procedure.  He was admitted overnight for observation.  Unfortunately postoperatively he has had a complicated course with postop ileus and associated distention causing shortness of breath.  Today had an NG tube placed.  Throughout the day today he has developed worsened agitation, tachycardia, restlessness, and tremors.  Although initially he indicated he drink few days per week, family indicated that he drinks more than what he had initially endorsed.  Due to concern for significant alcohol withdrawal and DTs he was transferred to the intensive care unit.  He is unable to receive oral benzodiazepines to help control symptoms due to his ileus.  This evening his main complaint is being thirsty and wanting to get out of restraints.  He has had significant agitation and is only intermittently redirectable.  Pertinent  Medical History  Alcohol abuse Obesity DM 2 Gout Hiatal hernia Hypertension  Significant Hospital Events: Including procedures, antibiotic start and stop dates in addition to other pertinent events   8/4 knee surgery 8/5 abdominal distention 8/6 NG tube placement for gastric decompression 8/7 transfer to ICU, initiated on Precedex and phenobarbital. Intubated for respiratory failure.  Interim History / Subjective:  More distended this morning. Difficult to sedate overnight and was started on fentanyl gtt.  Objective   Blood pressure 131/78, pulse 74, temperature 100.1 F (37.8 C), temperature source Axillary, resp. rate 20, height '5\' 10"'$  (1.778 m), weight 123.8 kg, SpO2 100 %.    Vent Mode: PRVC FiO2 (%):  [50  %-100 %] 50 % Set Rate:  [20 bmp] 20 bmp Vt Set:  [580 mL] 580 mL PEEP:  [5 cmH20] 5 cmH20 Plateau Pressure:  [18 cmH20-21 cmH20] 21 cmH20   Intake/Output Summary (Last 24 hours) at 07/11/2022 1108 Last data filed at 07/11/2022 0630 Gross per 24 hour  Intake 1825.45 ml  Output 800 ml  Net 1025.45 ml    Filed Weights   07/07/22 0656  Weight: 123.8 kg    Examination: General: critically ill appearing man lying in bed in NAD HENT: [place]/AT, eyes anicteric Lungs: tachypneic, breathing synchronously with MV, CTAB. Plat 25, DP 19. Cardiovascular: S1S2, RRR Abdomen:  very distended, hypoactive bowel sounds Extremities: Left greater than right lower extremity edema, no clubbing Neuro: RASS -4, pupils reactive.  Strong cough reflex.. GU: Foley catheter in place Derm-no diffuse rashes  7.39/43/92/25 on 50%  Sodium 132 BUN 28 Creatinine 1.8 Bilirubin 1.7, direct bilirubin 1.2 LA 1.9 WBC 2.9 H/H 10.3/30.2 Platelet 244 Reticulocyte fraction 24%   Resolved Hospital Problem list     Assessment & Plan:  Acute respiratory failure with hypoxia; I worry that abdominal distention from ileus could be contributing.  No signs of aspiration at the time of intubation. -LTVV - VAP prevention protocol - PAD protocol for sedation - Titrate down PEEP and FiO2 per ARDS ladder. - Medical management of ileus. - Daily SAT and SBT as appropriate.  Need abdominal distention to be improved  Acute alcohol withdrawal with agitation, tachycardia, acute metabolic encephalopathy - Sedation with propofol and ketamine while on mechanical ventilation - Needs vitamins added able to take p.o.  Postoperative ileus Hyponatremia - Aggressive management of electrolytes -  Additional fluids given due to high NG output - NG tube to low intermittent wall suction. - Holding all oral medications, strict NPO.  Fevers; unclear etiolgoy -cultures today -check LA> WNL -start empiric  antibiotics  AKI Hyponatremia Hypophosphatemia -Strict I's/O - Volume resuscitation - Check bladder pressures - Renally dose meds and avoid nephrotoxic meds - Avoid fleets enemas. -Electrolyte repletion  Osteoarthritis, s/p left TKA - Pain control per PAD protocol -PT, OT as able -Discontinue Robaxin and gabapentin.  Hyperbilirubinemia, likely due to ileus vs sepsis.  Normal liver and gallbladder on recent CT on 8/6 - Continue to monitor  Acute postop anemia-likely due to critical illness, arterial blood loss. Likely component of iron deficiency anemia - Transfuse for hemoglobin less than 7 or hemodynamically significant bleeding -Monitor  Hyperglycemia, preDM- A1c 6.3 - Sliding scale insulin PRN -goal BG 140-180  Best Practice (right click and "Reselect all SmartList Selections" daily)   Diet/type: NPO DVT prophylaxis: prophylactic heparin  GI prophylaxis: PPI Lines: N/A Foley:  Yes, and it is still needed Code Status:  full code Last date of multidisciplinary goals of care discussion [ 8/7-wife wife]  Labs   CBC: Recent Labs  Lab 07/08/22 0242 07/09/22 1818 07/11/22 0214  WBC 12.5* 9.3 2.9*  HGB 12.2* 13.2 10.3*  HCT 36.3* 37.9* 30.2*  MCV 103.1* 102.7* 103.4*  PLT 271 314 244     Basic Metabolic Panel: Recent Labs  Lab 07/08/22 0242 07/09/22 1818 07/10/22 0749 07/11/22 0214  NA 135 134* 130* 132*  K 4.6 4.4 4.4 4.2  CL 101 95* 95* 97*  CO2 '25 26 23 26  '$ GLUCOSE 164* 189* 161* 159*  BUN 18 32* 29* 28*  CREATININE 1.19 2.39* 1.53* 1.80*  CALCIUM 9.2 9.8 8.9 8.6*  MG  --   --   --  2.1  PHOS  --   --   --  2.2*    GFR: Estimated Creatinine Clearance: 61.2 mL/min (A) (by C-G formula based on SCr of 1.8 mg/dL (H)). Recent Labs  Lab 07/08/22 0242 07/09/22 1818 07/11/22 0214 07/11/22 0750  WBC 12.5* 9.3 2.9*  --   LATICACIDVEN  --   --   --  1.9     Liver Function Tests: Recent Labs  Lab 07/09/22 1818 07/11/22 0214  AST 45* 31   ALT 23 22  ALKPHOS 65 71  BILITOT 2.2* 1.7*  PROT 8.5* 6.7  ALBUMIN 4.2 3.0*    Recent Labs  Lab 07/09/22 1818  LIPASE 24    No results for input(s): "AMMONIA" in the last 168 hours.  ABG    Component Value Date/Time   PHART 7.39 07/11/2022 0458   PCO2ART 43 07/11/2022 0458   PO2ART 92 07/11/2022 0458   HCO3 25.4 07/11/2022 0458   O2SAT 100 07/11/2022 0458    Critical care time: 50 min.     Julian Hy, DO 07/11/22 12:43 PM [facility]

## 2022-07-11 NOTE — Progress Notes (Signed)
PCCM Progress Note   Called to beside to assist with placement of NG/OG tube. Patient has post-operative ileus which requires gastric decompression. Multiple attempts were made to attempt to place either NG or OG and all failed. Tube consistently coiled in esophagus with each attempt. Will coordinate with radiology to see if tube can be placed with floro. Will also call general surgery to see if any other medical interventions could be used to assist with ileus management.  Nevaeha Finerty D. Kenton Kingfisher, NP-C [facility] Personal contact information can be found on Amion  07/11/2022, 3:56 PM

## 2022-07-11 NOTE — Progress Notes (Signed)
Pharmacy Antibiotic Note  Randy Combs is a 56 y.o. male admitted on 07/07/2022 for elective knee surgery, with postop course complicated by ileus, NG tube placed, worsening symptoms thought to be related to alcohol withdrawal, and intubation on 8/7 for respiratory decompensation.  Pharmacy has been consulted for Cefepime and Vancomycin dosing.  Plan: Cefepime 2g IV q8h Vancomycin 2g IV x1 then 1250 mg IV q24h (SCr 1.8, Vd 0.5, est AUC 457) Measure Vanc levels as needed.  Goal AUC = 400 - 550. Follow up renal function, culture results, and clinical course.   Height: '5\' 10"'$  (177.8 cm) Weight: 123.8 kg (273 lb) IBW/kg (Calculated) : 73  Temp (24hrs), Avg:99.3 F (37.4 C), Min:97.8 F (36.6 C), Max:101.7 F (38.7 C)  Recent Labs  Lab 07/08/22 0242 07/09/22 1818 07/10/22 0749 07/11/22 0214  WBC 12.5* 9.3  --  2.9*  CREATININE 1.19 2.39* 1.53* 1.80*    Estimated Creatinine Clearance: 61.2 mL/min (A) (by C-G formula based on SCr of 1.8 mg/dL (H)).    No Active Allergies  Antimicrobials this admission: 8/4 Cefazolin perioperatively 8/8 Vancomycin >> 8/8 Cefepime >>   Dose adjustments this admission:   Microbiology results: 8/7 MRSA PCR: not detected 8/8 Resp cxt: 8/8 BCx:   Thank you for allowing pharmacy to be a part of this patient's care.  Gretta Arab PharmD, BCPS Clinical Pharmacist [facility] main pharmacy [phone] 07/11/2022 7:33 AM

## 2022-07-12 ENCOUNTER — Inpatient Hospital Stay (HOSPITAL_COMMUNITY): Payer: 59

## 2022-07-12 DIAGNOSIS — Z9911 Dependence on respirator [ventilator] status: Secondary | ICD-10-CM | POA: Diagnosis not present

## 2022-07-12 DIAGNOSIS — F10931 Alcohol use, unspecified with withdrawal delirium: Secondary | ICD-10-CM

## 2022-07-12 DIAGNOSIS — J9601 Acute respiratory failure with hypoxia: Secondary | ICD-10-CM | POA: Diagnosis not present

## 2022-07-12 DIAGNOSIS — Z96652 Presence of left artificial knee joint: Secondary | ICD-10-CM | POA: Diagnosis not present

## 2022-07-12 DIAGNOSIS — M1712 Unilateral primary osteoarthritis, left knee: Secondary | ICD-10-CM | POA: Diagnosis not present

## 2022-07-12 LAB — GLUCOSE, CAPILLARY
Glucose-Capillary: 105 mg/dL — ABNORMAL HIGH (ref 70–99)
Glucose-Capillary: 105 mg/dL — ABNORMAL HIGH (ref 70–99)
Glucose-Capillary: 89 mg/dL (ref 70–99)
Glucose-Capillary: 90 mg/dL (ref 70–99)
Glucose-Capillary: 97 mg/dL (ref 70–99)
Glucose-Capillary: 98 mg/dL (ref 70–99)

## 2022-07-12 LAB — BASIC METABOLIC PANEL
Anion gap: 12 (ref 5–15)
BUN: 48 mg/dL — ABNORMAL HIGH (ref 6–20)
CO2: 22 mmol/L (ref 22–32)
Calcium: 8 mg/dL — ABNORMAL LOW (ref 8.9–10.3)
Chloride: 99 mmol/L (ref 98–111)
Creatinine, Ser: 2.31 mg/dL — ABNORMAL HIGH (ref 0.61–1.24)
GFR, Estimated: 33 mL/min — ABNORMAL LOW (ref >60)
Glucose, Bld: 111 mg/dL — ABNORMAL HIGH (ref 70–99)
Potassium: 4 mmol/L (ref 3.5–5.1)
Sodium: 133 mmol/L — ABNORMAL LOW (ref 135–145)

## 2022-07-12 LAB — PHOSPHORUS: Phosphorus: 3.1 mg/dL (ref 2.5–4.6)

## 2022-07-12 LAB — MAGNESIUM: Magnesium: 1.9 mg/dL (ref 1.7–2.4)

## 2022-07-12 MED ORDER — SODIUM CHLORIDE 0.9 % IV SOLN: INTRAVENOUS | Status: DC

## 2022-07-12 MED ORDER — DOCUSATE SODIUM 50 MG/5ML PO LIQD
100.0000 mg | Freq: Two times a day (BID) | ORAL | Status: DC
Start: 2022-07-12 — End: 2022-07-17
  Administered 2022-07-12 – 2022-07-16 (×7): 100 mg
  Filled 2022-07-12 (×8): qty 10

## 2022-07-12 MED ORDER — SODIUM CHLORIDE 0.9 % IV SOLN
2.0000 mg/kg/h | INTRAVENOUS | Status: DC
  Administered 2022-07-12 – 2022-07-14 (×4): 2 mg/kg/h via INTRAVENOUS
  Filled 2022-07-12 (×6): qty 50

## 2022-07-12 MED ORDER — MAGNESIUM SULFATE 2 GM/50ML IV SOLN
2.0000 g | Freq: Once | INTRAVENOUS | Status: AC
Start: 2022-07-12 — End: 2022-07-12
  Administered 2022-07-12: 2 g via INTRAVENOUS
  Filled 2022-07-12: qty 50

## 2022-07-12 MED ORDER — BISACODYL 10 MG RE SUPP
10.0000 mg | Freq: Every day | RECTAL | Status: DC
  Administered 2022-07-12 – 2022-07-16 (×4): 10 mg via RECTAL
  Filled 2022-07-12 (×5): qty 1

## 2022-07-12 MED ORDER — LIDOCAINE HCL URETHRAL/MUCOSAL 2 % EX GEL
CUTANEOUS | Status: AC
  Filled 2022-07-12: qty 11

## 2022-07-12 MED ORDER — SIMETHICONE 40 MG/0.6ML PO SUSP
80.0000 mg | Freq: Once | ORAL | Status: AC
Start: 2022-07-13 — End: 2022-07-13
  Administered 2022-07-13: 80 mg
  Filled 2022-07-12: qty 1.2

## 2022-07-12 MED ORDER — POLYETHYLENE GLYCOL 3350 17 G PO PACK
17.0000 g | PACK | Freq: Every day | ORAL | Status: DC
  Administered 2022-07-14 – 2022-07-18 (×5): 17 g
  Filled 2022-07-12 (×6): qty 1

## 2022-07-12 MED ORDER — MIDAZOLAM HCL 2 MG/2ML IJ SOLN
2.0000 mg | INTRAMUSCULAR | Status: AC | PRN
  Administered 2022-07-12 – 2022-07-14 (×3): 2 mg via INTRAVENOUS
  Filled 2022-07-12 (×3): qty 2

## 2022-07-12 MED ORDER — METHYLNALTREXONE BROMIDE 12 MG/0.6ML ~~LOC~~ SOLN
12.0000 mg | Freq: Once | SUBCUTANEOUS | Status: AC
  Administered 2022-07-12: 12 mg via SUBCUTANEOUS
  Filled 2022-07-12: qty 0.6

## 2022-07-12 MED ORDER — PIPERACILLIN-TAZOBACTAM 3.375 G IVPB
3.3750 g | Freq: Three times a day (TID) | INTRAVENOUS | Status: DC
  Administered 2022-07-12 – 2022-07-13 (×4): 3.375 g via INTRAVENOUS
  Filled 2022-07-12 (×4): qty 50

## 2022-07-12 MED ORDER — MIDAZOLAM HCL 2 MG/2ML IJ SOLN
2.0000 mg | INTRAMUSCULAR | Status: DC | PRN
  Administered 2022-07-12 – 2022-07-14 (×4): 2 mg via INTRAVENOUS
  Filled 2022-07-12 (×6): qty 2

## 2022-07-12 NOTE — Progress Notes (Signed)
Subjective: 5 Days Post-Op Procedure(s) (LRB): LEFT TOTAL KNEE ARTHROPLASTY (Left) Patient intibated.   Objective: Vital signs in last 24 hours: Temp:  [98.9 F (37.2 C)-100.3 F (37.9 C)] 98.9 F (37.2 C) (08/09 1143) Pulse Rate:  [66-121] 66 (08/09 1000) Resp:  [11-26] 20 (08/09 1000) BP: (88-206)/(35-87) 106/58 (08/09 1000) SpO2:  [91 %-100 %] 93 % (08/09 1000) FiO2 (%):  [40 %-50 %] 40 % (08/09 1249)  Intake/Output from previous day: 08/08 0701 - 08/09 0700 In: 4581.3 [I.V.:3256.4; IV Piggyback:1324.9] Out: 1050 [Urine:750; Emesis/NG output:300] Intake/Output this shift: Total I/O In: 50 [IV Piggyback:50] Out: 150 [Urine:150]  Recent Labs    07/09/22 1818 07/11/22 0214  HGB 13.2 10.3*   Recent Labs    07/09/22 1818 07/11/22 0214  WBC 9.3 2.9*  RBC 3.69* 2.92*  2.92*  HCT 37.9* 30.2*  PLT 314 244   Recent Labs    07/11/22 0214 07/12/22 0557  NA 132* 133*  K 4.2 4.0  CL 97* 99  CO2 26 22  BUN 28* 48*  CREATININE 1.80* 2.31*  GLUCOSE 159* 111*  CALCIUM 8.6* 8.0*   No results for input(s): "LABPT", "INR" in the last 72 hours.  Left lower extremity; Incision: scant drainage Compartment soft   Assessment/Plan: 5 Days Post-Op Procedure(s) (LRB): LEFT TOTAL KNEE ARTHROPLASTY (Left) CPM left leg  0- 60 degrees 4-6 hrs per day in 2 hour intervals.       Carlyle Achenbach 07/12/2022, 1:10 PM

## 2022-07-12 NOTE — Progress Notes (Signed)
Pharmacy Antibiotic Note  Randy Combs is a 56 y.o. male admitted on 07/07/2022 for elective knee surgery, with postop course complicated by ileus, worsening symptoms thought to be related to alcohol withdrawal, AKI, and intubation on 8/7 for respiratory decompensation.  Pharmacy has been consulted for Zosyn dosing for aspiration pneumonia.   Plan: Zosyn 3.375g IV Q8H infused over 4hrs.  Follow up renal function, culture results, and clinical course.   Height: '5\' 10"'$  (177.8 cm) Weight: 123.8 kg (273 lb) IBW/kg (Calculated) : 73  Temp (24hrs), Avg:99.7 F (37.6 C), Min:98.9 F (37.2 C), Max:100.3 F (37.9 C)  Recent Labs  Lab 07/08/22 0242 07/09/22 1818 07/10/22 0749 07/11/22 0214 07/11/22 0750 07/11/22 1844 07/12/22 0557  WBC 12.5* 9.3  --  2.9*  --   --   --   CREATININE 1.19 2.39* 1.53* 1.80*  --   --  2.31*  LATICACIDVEN  --   --   --   --  1.9 1.6  --      Estimated Creatinine Clearance: 47.7 mL/min (A) (by C-G formula based on SCr of 2.31 mg/dL (H)).    No Active Allergies  Antimicrobials this admission: 8/4 Cefazolin perioperatively 8/8 Vancomycin >> 8/9 8/8 Cefepime >> 8/9 8/9 Zosyn >>   Dose adjustments this admission:   Microbiology results: 8/7 MRSA PCR: not detected 8/8 Resp cxt: gram stain: GPC, GNR, Cxt ip 8/8 BCx: ngtd  Thank you for allowing pharmacy to be a part of this patient's care.  Gretta Arab PharmD, BCPS Clinical Pharmacist [facility] main pharmacy [phone] 07/12/2022 7:50 AM

## 2022-07-12 NOTE — Progress Notes (Signed)
Orthopedic Tech Progress Note Patient Details:  Randy Combs May 31, 1966 [id number]  Patient ID: Randy Combs, male   DOB: 1966/11/16, 56 y.o.   MRN: [id number]  [name] 07/12/2022, 3:21 PM Cpm removed from left leg

## 2022-07-12 NOTE — Progress Notes (Signed)
[facility] Progress Note Patient Name: Randy Combs DOB: [birth year] MRN: [id number]   Date of Service  07/12/2022  HPI/Events of Note  Abdominal film reveals continued abdominal bowel gas c/w ileus. Patient is already off of his Fentanyl IV infusion. NGT to LIS. In addition, the patient received a dose of Methylnaltrexone [name] at 10 PM.   eICU Interventions  Plan: Simethacone 80 mg per tube x 1 dose.  Consider Neostigmine in AM if no improvement with Methylnaltrexone.      Intervention Category Major Interventions: Other:  Lysle Dingwall 07/12/2022, 11:36 PM

## 2022-07-12 NOTE — Progress Notes (Signed)
NAME:  Randy Combs, MRN:  [id number], DOB:  16-Sep-1966, LOS: 1 ADMISSION DATE:  07/07/2022, CONSULTATION DATE:  07/10/22 REFERRING MD:  Margarite Gouge, CHIEF COMPLAINT:  ETOH w/d   History of Present Illness:  Mr. Randy Combs is a 56 year old gentleman admitted for elective knee surgery on 07/07/2022.  He underwent local, regional, spinal, and general anesthesia for his procedure.  He was admitted overnight for observation.  Unfortunately postoperatively he has had a complicated course with postop ileus and associated distention causing shortness of breath.  Today had an NG tube placed.  Throughout the day today he has developed worsened agitation, tachycardia, restlessness, and tremors.  Although initially he indicated he drink few days per week, family indicated that he drinks more than what he had initially endorsed.  Due to concern for significant alcohol withdrawal and DTs he was transferred to the intensive care unit.  He is unable to receive oral benzodiazepines to help control symptoms due to his ileus.  This evening his main complaint is being thirsty and wanting to get out of restraints.  He has had significant agitation and is only intermittently redirectable.  Pertinent  Medical History  Alcohol abuse Obesity DM 2 Gout Hiatal hernia Hypertension  Significant Hospital Events: Including procedures, antibiotic start and stop dates in addition to other pertinent events   8/4 knee surgery 8/5 abdominal distention 8/6 NG tube placement for gastric decompression 8/7 transfer to ICU, initiated on Precedex and phenobarbital. Intubated for respiratory failure. 8/9 Remains on vent sedated with distended abdomen. AKI worse today  Interim History / Subjective:  Sedated on vent  ~8L positive   Objective   Blood pressure (!) 148/77, pulse 68, temperature 99.5 F (37.5 C), temperature source Axillary, resp. rate 17, height '5\' 10"'$  (1.778 m), weight 123.8 kg, SpO2 96 %.    Vent Mode: PRVC FiO2  (%):  [40 %-50 %] 40 % Set Rate:  [20 bmp] 20 bmp Vt Set:  [580 mL] 580 mL PEEP:  [5 cmH20] 5 cmH20 Plateau Pressure:  [21 cmH20-29 cmH20] 22 cmH20   Intake/Output Summary (Last 24 hours) at 07/12/2022 0710 Last data filed at 07/12/2022 0600 Gross per 24 hour  Intake 4096.15 ml  Output 1050 ml  Net 3046.15 ml    Filed Weights   07/07/22 0656  Weight: 123.8 kg    Examination: General: Acute ill appearing middle aged male lying in bed on mechanical ventilation, in NAD HEENT: ETT, MM pink/moist, PERRL,  Neuro: Sedated on vent, RASS -3 CV: s1s2 regular rate and rhythm, no murmur, rubs, or gallops,  PULM:  Slight rhonchi bilaterally, no increased work of breathing, tolerating vent GI: soft, bowel sounds hypoactive in all 4 quadrants, very distended Extremities: warm/dry, generalized non-pitting edema  Skin: no rashes or lesions  Resolved Hospital Problem list     Assessment & Plan:  Acute respiratory failure with hypoxia Fevers unclear etiology but high concern for aspiration pneumonia  -I worry that abdominal distention from ileus could be contributing.  No signs of aspiration at the time of intubation. P: Continue ventilator support with lung protective strategies  Wean PEEP and FiO2 for sats greater than 90%. Head of bed elevated 30 degrees. Plateau pressures less than 30 cm H20.  Follow intermittent chest x-ray and ABG.   SAT/SBT as tolerated, mentation preclude extubation  Ensure adequate pulmonary hygiene  Follow cultures  VAP bundle in place  PAD protocol Empiric Cefepime and Vancomycin started 8/8 MRSA negative will transition to Zosyn  Continue to  follow CBC and fever curve  Follow cultures   Acute metabolic encephalopathy in the setting of alcohol withdrawal  P: Maintain neuro protective measures Nutrition and bowel regiment  Aspirations precautions  PAD protocol with Propofol and Ketamine  CIWA protocol  Supplement thiamine, folate, and MVI  Postoperative  ileus -Prior to intubation NG tube was placed but this was dislodged, multiple attempt were made to replace 8/8 but all failed. Radiology to assist in placement under floro 8/9 -Family reports history of large hiatal hernia that was repaired 5 years ago this is likely contributing to difficult NG/OG placement  Hyponatremia P: Received dose of Relistor 8/8, assess need to re-dose Resume decompression once NG/OG replaced  Scheduled dulcolax suppositories  Strict NPO  Optimize electrolytes with K > 4, Mg > 2  AKI Hyponatremia Hypophosphatemia P: Follow renal function  Monitor urine output Trend Bmet Avoid nephrotoxins Ensure adequate renal perfusion  IV hydration Monitor bladder pressure  Osteoarthritis, s/p left TKA P: Primary management per ortho  Ensure adequate pain control  PT/OT when able  Needs passive ROM to left knee per ortho   Hyperbilirubinemia, likely due to ileus vs sepsis.   -Normal liver and gallbladder on recent CT on 8/6 P: Continue to trend   Acute postop anemia Likely component of iron deficiency anemia -likely due to critical illness, arterial blood loss. P: Transfuse per protocol  Hgb goal > 7 Monitor for signs of bleeding   Hyperglycemia, preDM- A1c 6.3 P: Continue SSI  CBG goal 140-180 CBG checks q4hrs   Best Practice (right click and "Reselect all SmartList Selections" daily)   Diet/type: NPO DVT prophylaxis: prophylactic heparin  GI prophylaxis: PPI Lines: N/A Foley:  Yes, and it is still needed Code Status:  full code Last date of multidisciplinary goals of care discussion [ 8/7-wife wife]  Critical care time: 42 min.  Beaux Wedemeyer D. Kenton Kingfisher, NP-C [facility] Personal contact information can be found on Amion  07/12/2022, 7:31 AM

## 2022-07-12 NOTE — Progress Notes (Signed)
[facility] Progress Note Patient Name: Randy Combs DOB: 04/12/1966 MRN: [id number]   Date of Service  07/12/2022  HPI/Events of Note  Nursing reports not being able to appreciate bowel sounds. Patient with known ileus and NGT replaced today.   eICU Interventions  Plan: Abdominal xray STAT to assess NGT placement.      Intervention Category Major Interventions: Other:  Lysle Dingwall 07/12/2022, 9:17 PM

## 2022-07-12 NOTE — Progress Notes (Signed)
PT Cancellation Note  Patient Details Name: Randy Combs MRN: [id number] DOB: 1966-04-02   Cancelled Treatment:       Pt still sedated and on vent but will continue to follow.  Rica Koyanagi  PTA Acute  Rehabilitation Services Office M-F          [phone] Weekend pager [phone]

## 2022-07-12 NOTE — Progress Notes (Signed)
Orthopedic Tech Progress Note Patient Details:  Randy Combs 18-Sep-1966 [id number]  Patient ID: Randy Combs, male   DOB: July 22, 1966, 56 y.o.   MRN: [id number]  Randy Combs 07/12/2022, 12:52 PM Cpm applied to left leg.

## 2022-07-13 ENCOUNTER — Inpatient Hospital Stay (HOSPITAL_COMMUNITY): Payer: 59

## 2022-07-13 DIAGNOSIS — J690 Pneumonitis due to inhalation of food and vomit: Secondary | ICD-10-CM | POA: Diagnosis not present

## 2022-07-13 DIAGNOSIS — Z9911 Dependence on respirator [ventilator] status: Secondary | ICD-10-CM | POA: Diagnosis not present

## 2022-07-13 DIAGNOSIS — J9601 Acute respiratory failure with hypoxia: Secondary | ICD-10-CM | POA: Diagnosis not present

## 2022-07-13 DIAGNOSIS — R609 Edema, unspecified: Secondary | ICD-10-CM | POA: Diagnosis not present

## 2022-07-13 DIAGNOSIS — K567 Ileus, unspecified: Secondary | ICD-10-CM

## 2022-07-13 DIAGNOSIS — K9189 Other postprocedural complications and disorders of digestive system: Secondary | ICD-10-CM

## 2022-07-13 DIAGNOSIS — G928 Other toxic encephalopathy: Secondary | ICD-10-CM | POA: Diagnosis not present

## 2022-07-13 DIAGNOSIS — N179 Acute kidney failure, unspecified: Secondary | ICD-10-CM | POA: Diagnosis not present

## 2022-07-13 DIAGNOSIS — M1712 Unilateral primary osteoarthritis, left knee: Secondary | ICD-10-CM | POA: Diagnosis not present

## 2022-07-13 DIAGNOSIS — D62 Acute posthemorrhagic anemia: Secondary | ICD-10-CM | POA: Diagnosis not present

## 2022-07-13 DIAGNOSIS — Z96652 Presence of left artificial knee joint: Secondary | ICD-10-CM | POA: Diagnosis not present

## 2022-07-13 LAB — GLUCOSE, CAPILLARY
Glucose-Capillary: 103 mg/dL — ABNORMAL HIGH (ref 70–99)
Glucose-Capillary: 112 mg/dL — ABNORMAL HIGH (ref 70–99)
Glucose-Capillary: 99 mg/dL (ref 70–99)
Glucose-Capillary: 107 mg/dL — ABNORMAL HIGH (ref 70–99)
Glucose-Capillary: 142 mg/dL — ABNORMAL HIGH (ref 70–99)
Glucose-Capillary: 144 mg/dL — ABNORMAL HIGH (ref 70–99)

## 2022-07-13 LAB — PHOSPHORUS: Phosphorus: 4 mg/dL (ref 2.5–4.6)

## 2022-07-13 LAB — BASIC METABOLIC PANEL
Anion gap: 12 (ref 5–15)
BUN: 47 mg/dL — ABNORMAL HIGH (ref 6–20)
CO2: 19 mmol/L — ABNORMAL LOW (ref 22–32)
Calcium: 8.1 mg/dL — ABNORMAL LOW (ref 8.9–10.3)
Chloride: 102 mmol/L (ref 98–111)
Creatinine, Ser: 1.78 mg/dL — ABNORMAL HIGH (ref 0.61–1.24)
GFR, Estimated: 44 mL/min — ABNORMAL LOW (ref >60)
Glucose, Bld: 113 mg/dL — ABNORMAL HIGH (ref 70–99)
Potassium: 3.9 mmol/L (ref 3.5–5.1)
Sodium: 133 mmol/L — ABNORMAL LOW (ref 135–145)

## 2022-07-13 LAB — CBC
HCT: 27.7 % — ABNORMAL LOW (ref 39.0–52.0)
Hemoglobin: 9 g/dL — ABNORMAL LOW (ref 13.0–17.0)
MCH: 34.9 pg — ABNORMAL HIGH (ref 26.0–34.0)
MCHC: 32.5 g/dL (ref 30.0–36.0)
MCV: 107.4 fL — ABNORMAL HIGH (ref 80.0–100.0)
Platelets: 263 10*3/uL (ref 150–400)
RBC: 2.58 MIL/uL — ABNORMAL LOW (ref 4.22–5.81)
RDW: 14.7 % (ref 11.5–15.5)
WBC: 4.7 10*3/uL (ref 4.0–10.5)
nRBC: 0 % (ref 0.0–0.2)

## 2022-07-13 LAB — CULTURE, RESPIRATORY W GRAM STAIN: Culture: NORMAL

## 2022-07-13 LAB — MAGNESIUM: Magnesium: 2.6 mg/dL — ABNORMAL HIGH (ref 1.7–2.4)

## 2022-07-13 MED ORDER — VITAL HIGH PROTEIN PO LIQD
1000.0000 mL | ORAL | Status: DC
  Administered 2022-07-13 – 2022-07-14 (×2): 1000 mL
  Administered 2022-07-14: 870 mL

## 2022-07-13 MED ORDER — ACETAMINOPHEN 650 MG RE SUPP
650.0000 mg | Freq: Three times a day (TID) | RECTAL | Status: DC
Start: 2022-07-13 — End: 2022-07-16
  Administered 2022-07-13 – 2022-07-16 (×6): 650 mg via RECTAL
  Filled 2022-07-13 (×8): qty 1

## 2022-07-13 MED ORDER — METHYLNALTREXONE BROMIDE 12 MG/0.6ML ~~LOC~~ SOLN
12.0000 mg | Freq: Every day | SUBCUTANEOUS | Status: DC
  Administered 2022-07-13 – 2022-07-16 (×4): 12 mg via SUBCUTANEOUS
  Filled 2022-07-13 (×4): qty 0.6

## 2022-07-13 MED ORDER — OXYCODONE HCL ER 15 MG PO T12A
15.0000 mg | EXTENDED_RELEASE_TABLET | Freq: Two times a day (BID) | ORAL | Status: DC
Start: 2022-07-13 — End: 2022-07-13

## 2022-07-13 MED ORDER — SODIUM CHLORIDE 0.9 % IV SOLN
3.0000 g | Freq: Four times a day (QID) | INTRAVENOUS | Status: AC
  Administered 2022-07-13 – 2022-07-15 (×9): 3 g via INTRAVENOUS
  Filled 2022-07-13 (×9): qty 8

## 2022-07-13 MED ORDER — METOCLOPRAMIDE HCL 5 MG/ML IJ SOLN
10.0000 mg | Freq: Three times a day (TID) | INTRAMUSCULAR | Status: DC
  Administered 2022-07-13 – 2022-07-19 (×18): 10 mg via INTRAVENOUS
  Filled 2022-07-13 (×18): qty 2

## 2022-07-13 MED ORDER — POTASSIUM CHLORIDE 10 MEQ/100ML IV SOLN
10.0000 meq | INTRAVENOUS | Status: AC
  Administered 2022-07-13 (×3): 10 meq via INTRAVENOUS
  Filled 2022-07-13 (×3): qty 100

## 2022-07-13 NOTE — Consult Note (Signed)
Consultation  Referring Provider:  CCM/ Carlis Abbott DO Primary Care Physician:  Owens Loffler, MD Primary Gastroenterologist:  Dr. Fuller Plan  Reason for Consultation: Ileus  HPI: Randy Combs is a 56 y.o. male, known to Dr. Fuller Plan from prior colonoscopies, last done January 2020 with removal of 5 polyps which were tubular adenomas and hyperplastic. We are currently asked to evaluate patient regarding colonic and small bowel ileus. Patient was initially admitted 07/07/2022 after an elective knee surgery which required overnight stay and was then complicated by postop ileus.  By 07/09/2022 he had developed restlessness, tremors and agitation, critical care became involved, transferred to the ICU, and has required sedation with Precedex and phenobarbital with vent support.  Symptoms felt secondary to EtOH withdrawal. He had an NG tube placed on 07/10/2022, it sounds as if this became displaced on 818, was replaced on 07/12/2022.  He also had a dose of Relistor on 07/12/2022. KUB yesterday showed unchanged diffuse gaseous distention of the bowel.  He had large volume output last evening, with 800 cc in canister this morning.  Since then he has really not had any more significant output from the NG.  He fortunately has started having bowel movements-with 2 bowel movements today.  Trickle feeds have been started at 10 cc an hour. He has been receiving Dulcolax suppositories, Colace per tube, MiraLAX per tube, and daily IV PPI  Labs today magnesium 2.6/potassium 3.9 BUN 47/creatinine 1.78 WBC 4.7/hemoglobin 9/hematocrit 27.7/MCV 107/platelets 263.  Has been receiving low-dose fentanyl as needed   Past Medical History:  Diagnosis Date   Alcohol abuse, in remission    quit in 2008   Arthritis    Bleeding ulcer    Blood transfusion without reported diagnosis    Glaucoma    Gout of multiple sites 03/01/2015   Hiatal hernia    Hypertension    Uncontrolled type 2 diabetes mellitus, without  long-term current use of insulin 06/12/2021    Past Surgical History:  Procedure Laterality Date   APPENDECTOMY  1970's   HERNIA REPAIR     abdominal   TOTAL KNEE ARTHROPLASTY Left 07/07/2022   Procedure: LEFT TOTAL KNEE ARTHROPLASTY;  Surgeon: Mcarthur Rossetti, MD;  Location: [facility];  Service: Orthopedics;  Laterality: Left;    Prior to Admission medications   Medication Sig Start Date End Date Taking? Authorizing Provider  cyclobenzaprine (FLEXERIL) 10 MG tablet Take 1 tablet (10 mg total) by mouth 3 (three) times daily as needed for muscle spasms. 06/28/22  Yes Mcarthur Rossetti, MD  indomethacin (INDOCIN) 50 MG capsule TAKE 1 CAPSULE BY MOUTH THREE TIMES A DAY AS NEEDED 11/25/21  Yes Copland, Frederico Hamman, MD  omeprazole (PRILOSEC) 20 MG capsule Take 1 capsule (20 mg total) by mouth daily. 12/16/21  Yes Kate Sable, MD  aspirin 81 MG chewable tablet Chew 1 tablet (81 mg total) by mouth 2 (two) times daily. 07/08/22   Mcarthur Rossetti, MD  colchicine 0.6 MG tablet Take 1 tablet (0.6 mg total) by mouth 2 (two) times daily. Patient not taking: Reported on 06/22/2022 04/03/22   Copland, Frederico Hamman, MD  methocarbamol (ROBAXIN) 500 MG tablet Take 1 tablet (500 mg total) by mouth every 6 (six) hours as needed for muscle spasms. 07/08/22   Mcarthur Rossetti, MD  oxyCODONE (OXY IR/ROXICODONE) 5 MG immediate release tablet Take 1-2 tablets (5-10 mg total) by mouth every 4 (four) hours as needed for moderate pain (pain score 4-6). 07/08/22   Mcarthur Rossetti, MD  traMADol (ULTRAM) 50 MG tablet Take 2 tablets (100 mg total) by mouth every 6 (six) hours as needed. 06/28/22   Mcarthur Rossetti, MD    Current Facility-Administered Medications  Medication Dose Route Frequency Provider Last Rate Last Admin   0.9 %  sodium chloride infusion   Intravenous PRN Julian Hy, DO   Paused at 07/11/22 0843   0.9 %  sodium chloride infusion  250 mL Intravenous Continuous Noemi Chapel  P, DO       0.9 %  sodium chloride infusion   Intravenous Continuous Gerald Leitz D, NP 100 mL/hr at 07/13/22 0901 Infusion Verify at 07/13/22 0901   acetaminophen (TYLENOL) suppository 650 mg  650 mg Rectal Q6H PRN Noemi Chapel P, DO       bisacodyl (DULCOLAX) suppository 10 mg  10 mg Rectal Daily PRN Vanetta Mulders, MD   10 mg at 07/10/22 0132   bisacodyl (DULCOLAX) suppository 10 mg  10 mg Rectal Daily Gerald Leitz D, NP   10 mg at 07/12/22 1007   Chlorhexidine Gluconate Cloth 2 % PADS 6 each  6 each Topical Daily Noemi Chapel P, DO   6 each at 07/13/22 0347   dexmedetomidine (PRECEDEX) 400 MCG/100ML (4 mcg/mL) infusion  0.4-1.2 mcg/kg/hr Intravenous Continuous Julian Hy, DO 15.48 mL/hr at 07/13/22 1205 0.5 mcg/kg/hr at 07/13/22 1205   docusate (COLACE) 50 MG/5ML liquid 100 mg  100 mg Per Tube BID Noemi Chapel P, DO   100 mg at 07/12/22 2135   feeding supplement (VITAL HIGH PROTEIN) liquid 1,000 mL  1,000 mL Per Tube Q24H Ollis, Brandi L, NP       fentaNYL (SUBLIMAZE) injection 50 mcg  50 mcg Intravenous Q15 min PRN Noemi Chapel P, DO   50 mcg at 07/11/22 1527   fentaNYL (SUBLIMAZE) injection 50-200 mcg  50-200 mcg Intravenous Q30 min PRN Noemi Chapel P, DO   100 mcg at 07/13/22 0353   heparin injection 5,000 Units  5,000 Units Subcutaneous Q8H Bonnielee Haff, MD   5,000 Units at 07/13/22 5397   insulin aspart (novoLOG) injection 1-3 Units  1-3 Units Subcutaneous Q4H Julian Hy, DO   1 Units at 07/11/22 1632   ketamine (KETALAR) 2,500 mg in sodium chloride 0.9 % 500 mL (5 mg/mL) infusion  2 mg/kg/hr Intravenous Continuous Julian Hy, DO 49.5 mL/hr at 07/13/22 0901 2 mg/kg/hr at 07/13/22 0901   labetalol (NORMODYNE) injection 5 mg  5 mg Intravenous Q2H PRN Vanetta Mulders, MD   5 mg at 07/10/22 0417   LORazepam (ATIVAN) tablet 1-4 mg  1-4 mg Per Tube Q1H PRN Julian Hy, DO       Or   LORazepam (ATIVAN) injection 1-4 mg  1-4 mg Intravenous Q1H PRN Julian Hy, DO        methylnaltrexone (RELISTOR) injection 12 mg  12 mg Subcutaneous Daily Noemi Chapel P, DO       midazolam (VERSED) injection 2 mg  2 mg Intravenous Q15 min PRN Noemi Chapel P, DO   2 mg at 07/12/22 1401   midazolam (VERSED) injection 2 mg  2 mg Intravenous Q2H PRN Noemi Chapel P, DO   2 mg at 07/12/22 1810   norepinephrine (LEVOPHED) '4mg'$  in 249m (0.016 mg/mL) premix infusion  2-10 mcg/min Intravenous Titrated CJulian Hy DO   Stopped at 07/13/22 0346   Oral care mouth rinse  15 mL Mouth Rinse Q2H KBonnielee Haff MD   15 mL at 07/13/22 1126  Oral care mouth rinse  15 mL Mouth Rinse PRN Bonnielee Haff, MD       pantoprazole (PROTONIX) injection 40 mg  40 mg Intravenous Daily Julian Hy, DO   40 mg at 07/13/22 0951   polyethylene glycol (MIRALAX / GLYCOLAX) packet 17 g  17 g Per Tube Daily Noemi Chapel P, DO       potassium chloride 10 mEq in 100 mL IVPB  10 mEq Intravenous Q1 Hr x 3 Ollis, Brandi L, NP 100 mL/hr at 07/13/22 1126 Infusion Verify at 07/13/22 1126   propofol (DIPRIVAN) 1000 MG/100ML infusion  0-50 mcg/kg/min Intravenous Continuous Julian Hy, DO 37.1 mL/hr at 07/13/22 1024 50 mcg/kg/min at 07/13/22 1024   thiamine (VITAMIN B1) injection 100 mg  100 mg Intravenous Daily Bonnielee Haff, MD   100 mg at 07/13/22 0951    Allergies as of 06/15/2022 - Review Complete 04/25/2022  Allergen Reaction Noted   Aspirin Other (See Comments) 03/01/2015    Family History  Adopted: Yes  Problem Relation Age of Onset   Lung cancer Mother    Healthy Father    Healthy Child    Healthy Child    Colon cancer Neg Hx    Esophageal cancer Neg Hx     Social History   Socioeconomic History   Marital status: Married    Spouse name: Not on file   Number of children: Not on file   Years of education: Not on file   Highest education level: Not on file  Occupational History   Not on file  Tobacco Use   Smoking status: Former    Types: Cigars    Quit date: 02/07/2013    Years since  quitting: 9.4   Smokeless tobacco: Current    Types: Snuff  Vaping Use   Vaping Use: Never used  Substance and Sexual Activity   Alcohol use: Yes    Comment: occas.   Drug use: No   Sexual activity: Not Currently  Other Topics Concern   Not on file  Social History Narrative   Grading work (runs equipment)      Married      Regular exercise      Quit alcohol;tobacco      No drugs   Social Determinants of Radio broadcast assistant Strain: Not on file  Food Insecurity: Not on file  Transportation Needs: Not on file  Physical Activity: Not on file  Stress: Not on file  Social Connections: Not on file  Intimate Partner Violence: Not on file    Review of Systems: Pt unable to provide  Physical Exam: Vital signs in last 24 hours: Temp:  [97.7 F (36.5 C)-99.3 F (37.4 C)] 97.9 F (36.6 C) (08/10 0725) Pulse Rate:  [38-81] 71 (08/10 1129) Resp:  [7-23] 22 (08/10 1129) BP: (93-149)/(54-128) 113/67 (08/10 1129) SpO2:  [94 %-100 %] 96 % (08/10 1129) FiO2 (%):  [30 %-40 %] 30 % (08/10 1129) Last BM Date : 07/13/22 General:   Alert,  Well-developed, older white male , sedated on vent, family member at bedside Head:  Normocephalic and atraumatic. Eyes:  Sclera clear, no icterus.   Conjunctiva pink. Ears:  Normal auditory acuity. Nose:  No deformity, discharge,  or lesions. Mouth:  No deformity or lesions.   Neck:  Supple; no masses or thyromegaly. Lungs:  Coarse breath sounds bilaterally  Heart:  Regular rate and rhythm; no murmurs, clicks, rubs,  or gallops. Abdomen: Protuberant, distended, bowel sounds quiet  but present unable to appreciate any tenderness, he is tympanitic.   Rectal: Not done Msk:  Symmetrical without gross deformities. . Pulses:  Normal pulses noted. Extremities:  Without clubbing or edema. Neurologic: Sedated on vent Skin:  Intact without significant lesions or rashes..   Intake/Output from previous day: 08/09 0701 - 08/10 0700 In: 3582.1  [I.V.:3287.1; NG/GT:120; IV Piggyback:150] Out: 3100 [Urine:1200; Emesis/NG output:1900] Intake/Output this shift: Total I/O In: 2246.1 [I.V.:1945.6; IV Piggyback:300.6] Out: 325 [Urine:150; Emesis/NG output:175]  Lab Results: Recent Labs    07/11/22 0214 07/13/22 0612  WBC 2.9* 4.7  HGB 10.3* 9.0*  HCT 30.2* 27.7*  PLT 244 263   BMET Recent Labs    07/11/22 0214 07/12/22 0557 07/13/22 0612  NA 132* 133* 133*  K 4.2 4.0 3.9  CL 97* 99 102  CO2 26 22 19*  GLUCOSE 159* 111* 113*  BUN 28* 48* 47*  CREATININE 1.80* 2.31* 1.78*  CALCIUM 8.6* 8.0* 8.1*   LFT Recent Labs    07/11/22 0214  PROT 6.7  ALBUMIN 3.0*  AST 31  ALT 22  ALKPHOS 71  BILITOT 1.7*  BILIDIR 0.5*  IBILI 1.2*   PT/INR No results for input(s): "LABPROT", "INR" in the last 72 hours. Hepatitis Panel No results for input(s): "HEPBSAG", "HCVAB", "HEPAIGM", "HEPBIGM" in the last 72 hours.   IMPRESSION:  #67 56 year old white male who underwent elective knee surgery on 07/07/2022, with local/regional spinal and general anesthesia- course complicated by postop abdominal pain and nausea and found to have ileus, NG placed  Ileus has been persistent, though appears to have made some progress over the past 24 hours with 2 bowel movements today  #2 postop also developed progressive agitation, tachycardia restlessness tremors, all felt secondary to acute alcohol withdrawal-required ICU transfer, vent support for adequate sedation  #3 acute kidney injury improving #4 adult onset diabetes mellitus #5 anemia multifactorial-no evidence for GI bleeding #6 nutrition-patient had been n.p.o. for 6 days, trickle feeds started this a.m.     PLAN: Okay to continue trickle feeds Try to eliminate narcotics Continue IV PPI daily Keep magnesium and potassium within normal range Daily enema/tapwater if no bowel movement, will not give today as he has had 2 bowel movements. Start IV Reglan 10 mg every 6 hours Repeat  abdominal films in a.m. Continue MiraLAX via tube daily and Colace 1-2 daily via tube Could consider second dose of Relistor  GI will follow with you     Tashi Andujo PA-C 07/13/2022, 12:09 PM

## 2022-07-13 NOTE — Progress Notes (Signed)
PT Cancellation Note  Patient Details Name: Randy Combs MRN: [id number] DOB: 1966-11-22   Cancelled Treatment:     pt still on VENT but will continue to follow.     Rica Koyanagi  PTA Acute  Rehabilitation Services Office M-F          [phone] Weekend pager [phone]

## 2022-07-13 NOTE — Progress Notes (Signed)
NUTRITION NOTE  Full assessment completed on 8/8. Able to talk with CCM MD and NP. Plan for Vital High Protein @ 10 ml/hr today (240 kcal, 21 grams protein, and 201 ml free water).  Plan to re-assess tomorrow to determine feasibility of advancement vs need for TPN.  RD will continue to follow and assist.     Jarome Matin, MS, RD, LDN, CNSC Registered Dietitian II Inpatient Clinical Nutrition RD pager # and on-call/weekend pager # available in [facility]

## 2022-07-13 NOTE — Progress Notes (Signed)
[place] Progress Note Patient Name: Randy Combs DOB: August 27, 1966 MRN: [id number]   Date of Service  07/13/2022  HPI/Events of Note  Agitation - Nursing request to renew restraint orders.   eICU Interventions  Will renew bilateral soft wrist restraints X 2 hours.      Intervention Category Major Interventions: Delirium, psychosis, severe agitation - evaluation and management  Britta Louth Eugene 07/13/2022, 7:01 AM

## 2022-07-13 NOTE — Progress Notes (Signed)
Informed e-link of new IAP of 20 during this shift. Per e-link to check residual of NG tube. This nurse collected a total of 40 mL of fluid from patient's NG tube, ensured the tube was not clogged with 10 mL of sterile water, and returned 40 mL residual back followed by a 20 mL flush to patient and reported findings to e-link. Per e-link to recheck IAP in 2 hours and report findings to e-link. Continue with current plan of care.

## 2022-07-13 NOTE — Progress Notes (Signed)
Subjective: 6 Days Post-Op Procedure(s) (LRB): LEFT TOTAL KNEE ARTHROPLASTY (Left) Patient intubated.   Objective: Vital signs in last 24 hours: Temp:  [97.7 F (36.5 C)-99.3 F (37.4 C)] 99.3 F (37.4 C) (08/10 1516) Pulse Rate:  [38-85] 85 (08/10 1600) Resp:  [7-23] 23 (08/10 1600) BP: (93-159)/(54-128) 159/79 (08/10 1600) SpO2:  [94 %-100 %] 100 % (08/10 1600) FiO2 (%):  [30 %-40 %] 30 % (08/10 1600)  Intake/Output from previous day: 08/09 0701 - 08/10 0700 In: 3582.1 [I.V.:3287.1; NG/GT:120; IV Piggyback:150] Out: 3100 [Urine:1200; Emesis/NG output:1900] Intake/Output this shift: Total I/O In: 3608.5 [I.V.:3178.3; NG/GT:30; IV Piggyback:400.2] Out: 1125 [Urine:950; Emesis/NG output:175]  Recent Labs    07/11/22 0214 07/13/22 0612  HGB 10.3* 9.0*   Recent Labs    07/11/22 0214 07/13/22 0612  WBC 2.9* 4.7  RBC 2.92*  2.92* 2.58*  HCT 30.2* 27.7*  PLT 244 263   Recent Labs    07/12/22 0557 07/13/22 0612  NA 133* 133*  K 4.0 3.9  CL 99 102  CO2 22 19*  BUN 48* 47*  CREATININE 2.31* 1.78*  GLUCOSE 111* 113*  CALCIUM 8.0* 8.1*   No results for input(s): "LABPT", "INR" in the last 72 hours.  Incision: scant drainage and wound benign.   Compartment soft   Assessment/Plan: 6 Days Post-Op Procedure(s) (LRB): LEFT TOTAL KNEE ARTHROPLASTY (Left) CPM Dressing changed.       Randy Combs 07/13/2022, 6:27 PM

## 2022-07-13 NOTE — Progress Notes (Signed)
[place] Progress Note Patient Name: Randy Combs DOB: 05/22/1966 MRN: [id number]   Date of Service  07/13/2022  HPI/Events of Note  Nursing reports increased Left arm swelling since beginning of shift.   eICU Interventions  Will order venous duplex ultrasound of L arm.      Intervention Category Major Interventions: Other:  Myosha Cuadras Cornelia Copa 07/13/2022, 5:25 AM

## 2022-07-13 NOTE — Progress Notes (Signed)
Pharmacy Antibiotic Note  Randy Combs is a 56 y.o. male admitted on 07/07/2022 for elective knee surgery, with postop course complicated by ileus, worsening symptoms thought to be related to alcohol withdrawal, AKI, and intubation on 8/7 for respiratory decompensation.  Pharmacy has been consulted for Unasyn dosing for aspiration pneumonia.   Plan: Unasyn 3 gr IV q6h  Follow up renal function, culture results, and clinical course.   Height: '5\' 10"'$  (177.8 cm) Weight: 123.8 kg (273 lb) IBW/kg (Calculated) : 73  Temp (24hrs), Avg:98.3 F (36.8 C), Min:97.7 F (36.5 C), Max:99.3 F (37.4 C)  Recent Labs  Lab 07/08/22 0242 07/09/22 1818 07/10/22 0749 07/11/22 0214 07/11/22 0750 07/11/22 1844 07/12/22 0557 07/13/22 0612  WBC 12.5* 9.3  --  2.9*  --   --   --  4.7  CREATININE 1.19 2.39* 1.53* 1.80*  --   --  2.31* 1.78*  LATICACIDVEN  --   --   --   --  1.9 1.6  --   --      Estimated Creatinine Clearance: 61.9 mL/min (A) (by C-G formula based on SCr of 1.78 mg/dL (H)).    No Active Allergies  Antimicrobials this admission: 8/4 Cefazolin perioperatively 8/8 Vancomycin >> 8/9 8/8 Cefepime >> 8/9 8/9 Zosyn >> 8/10 8/10 Unasyn >>   Dose adjustments this admission:   Microbiology results: 8/7 MRSA PCR: not detected 8/8 Resp cxt: gram stain: GPC, GNR, Cxt ip 8/8 BCx: ngtd  Thank you for allowing pharmacy to be a part of this patient's care.   Royetta Asal, PharmD, BCPS 07/13/2022 1:00 PM

## 2022-07-13 NOTE — Progress Notes (Signed)
[place] Progress Note Patient Name: Randy Combs DOB: July 07, 1966 MRN: [id number]   Date of Service  07/13/2022  HPI/Events of Note  Nursing reports desaturation post enama and patient more edematous. Sat = 97% and RR = 18.  eICU Interventions  Portable CXR now.      Intervention Category Major Interventions: Other:  Lysle Dingwall 07/13/2022, 5:16 AM

## 2022-07-13 NOTE — Progress Notes (Signed)
NAME:  Randy Combs, MRN:  [id number], DOB:  [birth year], LOS: 2 ADMISSION DATE:  07/07/2022, CONSULTATION DATE:  07/10/22 REFERRING MD:  Margarite Gouge, CHIEF COMPLAINT:  ETOH w/d   History of Present Illness:  56 year old M admitted for elective knee surgery on 07/07/2022.  He underwent local, regional, spinal, and general anesthesia for his procedure.  He was admitted overnight for observation.  Unfortunately postoperatively he has had a complicated course with postop ileus and associated distention causing shortness of breath.  NG tube placed.  He has developed worsened agitation, tachycardia, restlessness, and tremors.  Although initially he indicated he drink few days per week, family indicated that he drinks more than what he had initially endorsed.  Due to concern for significant alcohol withdrawal and DTs he was transferred to the intensive care unit.  He is unable to receive oral benzodiazepines to help control symptoms due to his ileus.  C/o being thirsty and wanting to get out of restraints.  He has had significant agitation and was only intermittently redirectable. Progressed to respiratory failure requiring intubation.   Pertinent  Medical History  Alcohol abuse Obesity DM 2 Gout Hiatal hernia Hypertension  Significant Hospital Events: Including procedures, antibiotic start and stop dates in addition to other pertinent events   8/4 Admit for elective knee surgery 8/5 Abdominal distention, post op ileus 8/6 NG tube placement for gastric decompression 8/7 Transfer to ICU, initiated on Precedex and phenobarbital. Intubated for respiratory failure. 8/9 Remains on vent sedated with distended abdomen. AKI worse today.  8L+  Interim History / Subjective:  Afebrile / WBC 4.7, on zosyn Vent - 40%, PEEP 5  Glucose range 103-113  I/O 1.2L UOP, NGT 1.9L, +419m in last 24 hours  RN reports BM this am, received enema overnight.    Objective   Blood pressure 102/62, pulse 70, temperature  97.7 F (36.5 C), temperature source Axillary, resp. rate (!) 22, height '5\' 10"'$  (1.778 m), weight 123.8 kg, SpO2 97 %.    Vent Mode: PRVC FiO2 (%):  [40 %] 40 % Set Rate:  [20 bmp] 20 bmp Vt Set:  [580 mL] 580 mL PEEP:  [5 cmH20] 5 cmH20 Plateau Pressure:  [22 cmH20-27 cmH20] 27 cmH20   Intake/Output Summary (Last 24 hours) at 07/13/2022 08676Last data filed at 07/13/2022 01950Gross per 24 hour  Intake 3582.08 ml  Output 3100 ml  Net 482.08 ml   Filed Weights   07/07/22 0656  Weight: 123.8 kg    Examination: General: ill appearing adult male lying in bed on vent in NAD HEENT: MM pink/moist, ETT, mild scleral edema, pupils 2-369m Neuro: sedate, raises eyebrows to voice, moves bilateral lower extremities spontaneously  CV: s1s2 RRR, no m/r/g PULM: non-labored at rest, lungs bilaterally clear GI: protuberant / taunt, decreased bowel sounds, NGT in place with green drainage, brown liquid stool on bed pad Extremities: warm/dry, 1+ generalized edema  Skin: no rashes or lesions MSK: left knee dressing clean / dry / intact   Resolved Hospital Problem list     Assessment & Plan:   Acute Respiratory Failure with Hypoxia Fever, Possible Aspiration Pneumonia / Pneumonitis  Concern ileus with significant abdominal distention is contributing to respiratory issues. No evidence of aspiration at time of intubation.  ABX started (cefepime + vanco) on 8/8, narrowed.  -PRVC with LTVV -may require lower Vt given abdominal distention  -wean PEEP / FiO2 for sats >90% -monitor plateau pressures, goal <30  -follow intermittent CXR  -SBT /  WUA when able -VAP prevention measures  -continue zosyn, D3/x abx  -follow up cultures, resp / blood pending   Acute Metabolic Encephalopathy in the setting of ETOH Withdrawal  -propofol + ketamine  -thiamine, folate, MVI  -follow CIWA score  Postoperative Ileus Prior to intubation NG tube was placed but this was dislodged, multiple attempt were made  to replace 8/8 but all failed. Radiology to assist in placement under floro 8/9.  History of large hiatal hernia that was repaired 5 years ago this is likely contributing to difficult NG/OG placement.  S/p Relistor 8/8. -goal K+>4, Mg+>2  -NGT to LIS, assess volume of output am 8/10, if decreased will attempt trickle feeding at 10 ml/hr -scheduled dulcolax suppositories  -NPO  -GI consult 8/10 -follow bladder pressure, ~14 over past few days -may need to place PICC for anticipated TPN need  AKI Hyponatremia Hypophosphatemia -continue IVF, NS at 100 ml/hr  -K/Mg+ goals as above  -Trend BMP / urinary output -Replace electrolytes as indicated -Avoid nephrotoxic agents, ensure adequate renal perfusion  Osteoarthritis, s/p left TKA -per Ortho -PT/OT as able  -CPM per Ortho  -PRN pain control, judicious narcotic use given ileus   Hyperbilirubinemia, likely due to Ileus vs Sepsis.   Normal liver and gallbladder on recent CT on 8/6 -follow, supportive care  Acute Postop Anemia + component of IDA & Critical Illness  -trend CBC  -transfuse for Hgb <7% or active bleeding   Hyperglycemia, Pre-DM Hgb A1c 6.3 -Glucose goal 140-180  Best Practice (right click and "Reselect all SmartList Selections" daily)  Diet/type: NPO DVT prophylaxis: prophylactic heparin  GI prophylaxis: PPI Lines: N/A Foley:  Yes, and it is still needed Code Status:  full code Last date of multidisciplinary goals of care discussion: 8/7 with wife.  Daughter, Estill Bamberg updated 8/10 on plan of care in detail, 21 minutes spent with family. Support offered.  Critical care time: 62 minutes    Noe Gens, MSN, APRN, NP-C, AGACNP-BC [facility] 07/13/2022, 7:47 AM   Please see [email or web address] for pager details.   From 7A-7P if no response, please call [phone] After hours, please call ELink [phone]

## 2022-07-13 NOTE — Progress Notes (Signed)
Wife updated at bedside on plan of care. Support offered.     Noe Gens, MSN, APRN, NP-C, AGACNP-BC [facility] 07/13/2022, 3:50 PM   Please see [email or web address] for pager details.   From 7A-7P if no response, please call [phone] After hours, please call ELink [phone]

## 2022-07-13 NOTE — Progress Notes (Signed)
LUE venous duplex has been completed.   Results can be found under chart review under CV PROC. 07/13/2022 3:09 PM Nevyn Bossman RVT, RDMS

## 2022-07-14 ENCOUNTER — Inpatient Hospital Stay (HOSPITAL_COMMUNITY): Payer: 59

## 2022-07-14 DIAGNOSIS — M1712 Unilateral primary osteoarthritis, left knee: Secondary | ICD-10-CM | POA: Diagnosis not present

## 2022-07-14 DIAGNOSIS — J690 Pneumonitis due to inhalation of food and vomit: Secondary | ICD-10-CM | POA: Diagnosis not present

## 2022-07-14 DIAGNOSIS — K567 Ileus, unspecified: Secondary | ICD-10-CM | POA: Diagnosis not present

## 2022-07-14 LAB — COMPREHENSIVE METABOLIC PANEL
ALT: 21 U/L (ref 0–44)
AST: 33 U/L (ref 15–41)
Albumin: 2.5 g/dL — ABNORMAL LOW (ref 3.5–5.0)
Alkaline Phosphatase: 82 U/L (ref 38–126)
Anion gap: 10 (ref 5–15)
BUN: 25 mg/dL — ABNORMAL HIGH (ref 6–20)
CO2: 20 mmol/L — ABNORMAL LOW (ref 22–32)
Calcium: 8.4 mg/dL — ABNORMAL LOW (ref 8.9–10.3)
Chloride: 107 mmol/L (ref 98–111)
Creatinine, Ser: 1.07 mg/dL (ref 0.61–1.24)
GFR, Estimated: >60 mL/min (ref >60)
Glucose, Bld: 127 mg/dL — ABNORMAL HIGH (ref 70–99)
Potassium: 3.6 mmol/L (ref 3.5–5.1)
Sodium: 137 mmol/L (ref 135–145)
Total Bilirubin: 1.1 mg/dL (ref 0.3–1.2)
Total Protein: 6.3 g/dL — ABNORMAL LOW (ref 6.5–8.1)

## 2022-07-14 LAB — OCCULT BLOOD X 1 CARD TO LAB, STOOL: Fecal Occult Bld: POSITIVE — AB

## 2022-07-14 LAB — BLOOD GAS, ARTERIAL
Acid-base deficit: 5.2 mmol/L — ABNORMAL HIGH (ref 0.0–2.0)
Acid-base deficit: 4 mmol/L — ABNORMAL HIGH (ref 0.0–2.0)
Bicarbonate: 18.9 mmol/L — ABNORMAL LOW (ref 20.0–28.0)
Bicarbonate: 19.8 mmol/L — ABNORMAL LOW (ref 20.0–28.0)
Drawn by: 59133
O2 Saturation: 97 %
O2 Saturation: 99.2 %
Patient temperature: 37
Patient temperature: 36.7
pCO2 arterial: 32 mmHg (ref 32–48)
pCO2 arterial: 32 mmHg (ref 32–48)
pH, Arterial: 7.38 (ref 7.35–7.45)
pH, Arterial: 7.4 (ref 7.35–7.45)
pO2, Arterial: 69 mmHg — ABNORMAL LOW (ref 83–108)
pO2, Arterial: 87 mmHg (ref 83–108)

## 2022-07-14 LAB — GLUCOSE, CAPILLARY
Glucose-Capillary: 125 mg/dL — ABNORMAL HIGH (ref 70–99)
Glucose-Capillary: 123 mg/dL — ABNORMAL HIGH (ref 70–99)
Glucose-Capillary: 130 mg/dL — ABNORMAL HIGH (ref 70–99)
Glucose-Capillary: 113 mg/dL — ABNORMAL HIGH (ref 70–99)
Glucose-Capillary: 133 mg/dL — ABNORMAL HIGH (ref 70–99)
Glucose-Capillary: 133 mg/dL — ABNORMAL HIGH (ref 70–99)

## 2022-07-14 LAB — CBC
HCT: 24.6 % — ABNORMAL LOW (ref 39.0–52.0)
Hemoglobin: 8.3 g/dL — ABNORMAL LOW (ref 13.0–17.0)
MCH: 35 pg — ABNORMAL HIGH (ref 26.0–34.0)
MCHC: 33.7 g/dL (ref 30.0–36.0)
MCV: 103.8 fL — ABNORMAL HIGH (ref 80.0–100.0)
Platelets: 295 10*3/uL (ref 150–400)
RBC: 2.37 MIL/uL — ABNORMAL LOW (ref 4.22–5.81)
RDW: 14.8 % (ref 11.5–15.5)
WBC: 8.6 10*3/uL (ref 4.0–10.5)
nRBC: 0 % (ref 0.0–0.2)

## 2022-07-14 LAB — TRIGLYCERIDES: Triglycerides: 307 mg/dL — ABNORMAL HIGH (ref <150)

## 2022-07-14 LAB — PHOSPHORUS: Phosphorus: 1.6 mg/dL — ABNORMAL LOW (ref 2.5–4.6)

## 2022-07-14 LAB — MAGNESIUM: Magnesium: 2.1 mg/dL (ref 1.7–2.4)

## 2022-07-14 MED ORDER — QUETIAPINE FUMARATE 50 MG PO TABS
50.0000 mg | ORAL_TABLET | Freq: Every day | ORAL | Status: DC
Start: 2022-07-14 — End: 2022-07-14

## 2022-07-14 MED ORDER — FENTANYL CITRATE PF 50 MCG/ML IJ SOSY
50.0000 ug | PREFILLED_SYRINGE | INTRAMUSCULAR | Status: DC | PRN
  Administered 2022-07-15 – 2022-07-17 (×4): 50 ug via INTRAVENOUS
  Filled 2022-07-14 (×5): qty 1

## 2022-07-14 MED ORDER — FENTANYL CITRATE (PF) 100 MCG/2ML IJ SOLN
INTRAMUSCULAR | Status: AC
  Filled 2022-07-14: qty 4

## 2022-07-14 MED ORDER — MIDAZOLAM HCL 2 MG/2ML IJ SOLN
2.0000 mg | Freq: Once | INTRAMUSCULAR | Status: AC
  Administered 2022-07-14: 2 mg via INTRAVENOUS

## 2022-07-14 MED ORDER — DEXMEDETOMIDINE HCL IN NACL 400 MCG/100ML IV SOLN
0.4000 ug/kg/h | INTRAVENOUS | Status: DC
  Administered 2022-07-14 (×2): 1.7 ug/kg/h via INTRAVENOUS
  Filled 2022-07-14: qty 100

## 2022-07-14 MED ORDER — HEPARIN SODIUM (PORCINE) 5000 UNIT/ML IJ SOLN
5000.0000 [IU] | Freq: Three times a day (TID) | INTRAMUSCULAR | Status: DC
  Administered 2022-07-14 – 2022-07-20 (×19): 5000 [IU] via SUBCUTANEOUS
  Filled 2022-07-14 (×18): qty 1

## 2022-07-14 MED ORDER — SIMETHICONE 40 MG/0.6ML PO SUSP
80.0000 mg | Freq: Once | ORAL | Status: AC
  Administered 2022-07-14: 80 mg
  Filled 2022-07-14: qty 1.2

## 2022-07-14 MED ORDER — MIDAZOLAM HCL 2 MG/2ML IJ SOLN
INTRAMUSCULAR | Status: AC
  Filled 2022-07-14: qty 4

## 2022-07-14 MED ORDER — ORAL CARE MOUTH RINSE: 15.0000 mL | OROMUCOSAL | Status: DC | PRN

## 2022-07-14 MED ORDER — ETOMIDATE 2 MG/ML IV SOLN
INTRAVENOUS | Status: AC
  Filled 2022-07-14: qty 10

## 2022-07-14 MED ORDER — ROCURONIUM BROMIDE 10 MG/ML (PF) SYRINGE
PREFILLED_SYRINGE | INTRAVENOUS | Status: DC
Start: 2022-07-14 — End: 2022-07-14
  Filled 2022-07-14: qty 10

## 2022-07-14 MED ORDER — ORAL CARE MOUTH RINSE
15.0000 mL | OROMUCOSAL | Status: DC
  Administered 2022-07-14 – 2022-07-15 (×2): 15 mL via OROMUCOSAL

## 2022-07-14 MED ORDER — CLONAZEPAM 0.5 MG PO TBDP
1.0000 mg | ORAL_TABLET | Freq: Two times a day (BID) | ORAL | Status: DC
  Filled 2022-07-14: qty 2

## 2022-07-14 MED ORDER — HALOPERIDOL LACTATE 5 MG/ML IJ SOLN
2.0000 mg | Freq: Once | INTRAMUSCULAR | Status: AC
Start: 2022-07-14 — End: 2022-07-14
  Administered 2022-07-14: 2 mg via INTRAVENOUS

## 2022-07-14 MED ORDER — DEXMEDETOMIDINE HCL IN NACL 200 MCG/50ML IV SOLN
0.4000 ug/kg/h | INTRAVENOUS | Status: DC
  Administered 2022-07-14 (×2): 1.2 ug/kg/h via INTRAVENOUS
  Administered 2022-07-14: 0.3 ug/kg/h via INTRAVENOUS
  Administered 2022-07-14: 0.7 ug/kg/h via INTRAVENOUS
  Administered 2022-07-15 (×5): 1.2 ug/kg/h via INTRAVENOUS
  Filled 2022-07-14 (×8): qty 50

## 2022-07-14 MED ORDER — PHENYLEPHRINE 80 MCG/ML (10ML) SYRINGE FOR IV PUSH (FOR BLOOD PRESSURE SUPPORT)
PREFILLED_SYRINGE | INTRAVENOUS | Status: AC
  Filled 2022-07-14: qty 10

## 2022-07-14 MED ORDER — FLEET ENEMA 7-19 GM/118ML RE ENEM
1.0000 | ENEMA | Freq: Once | RECTAL | Status: AC
  Administered 2022-07-14: 1 via RECTAL
  Filled 2022-07-14: qty 1

## 2022-07-14 MED ORDER — HALOPERIDOL LACTATE 5 MG/ML IJ SOLN
INTRAMUSCULAR | Status: AC
  Filled 2022-07-14: qty 1

## 2022-07-14 MED ORDER — FENTANYL CITRATE PF 50 MCG/ML IJ SOSY: 50.0000 ug | PREFILLED_SYRINGE | INTRAMUSCULAR | Status: DC | PRN

## 2022-07-14 MED ORDER — LABETALOL HCL 5 MG/ML IV SOLN
10.0000 mg | INTRAVENOUS | Status: DC | PRN
  Administered 2022-07-14 – 2022-07-18 (×7): 20 mg via INTRAVENOUS
  Filled 2022-07-14 (×7): qty 4

## 2022-07-14 MED ORDER — LABETALOL HCL 5 MG/ML IV SOLN
10.0000 mg | INTRAVENOUS | Status: DC | PRN
Start: 2022-07-14 — End: 2022-07-14
  Administered 2022-07-14: 10 mg via INTRAVENOUS
  Filled 2022-07-14: qty 4

## 2022-07-14 MED ORDER — FUROSEMIDE 10 MG/ML IJ SOLN
40.0000 mg | Freq: Once | INTRAMUSCULAR | Status: AC
  Administered 2022-07-14: 40 mg via INTRAVENOUS
  Filled 2022-07-14: qty 4

## 2022-07-14 MED ORDER — QUETIAPINE FUMARATE 50 MG PO TABS
50.0000 mg | ORAL_TABLET | Freq: Two times a day (BID) | ORAL | Status: DC
  Filled 2022-07-14: qty 1

## 2022-07-14 MED ORDER — POTASSIUM CHLORIDE 10 MEQ/100ML IV SOLN
10.0000 meq | INTRAVENOUS | Status: AC
  Administered 2022-07-14 (×4): 10 meq via INTRAVENOUS
  Filled 2022-07-14 (×4): qty 100

## 2022-07-14 NOTE — Progress Notes (Signed)
[place] Progress Note Patient Name: Randy Combs DOB: 1966-04-06 MRN: [id number]   Date of Service  07/14/2022  HPI/Events of Note  Nursing reports intraabdominal pressure = 20.   eICU Interventions  Plan: Hold trickle feeds. Simethicone 80 mg per tube now. Place gastric tube to LIS in 1 hour. Follow serial intraabdominal pressures.      Intervention Category Major Interventions: Other:  Lysle Dingwall 07/14/2022, 12:40 AM

## 2022-07-14 NOTE — Progress Notes (Signed)
155m of Ketamine wasted into stericycle. Witnessed by ZOrlie Dakin, RN

## 2022-07-14 NOTE — Progress Notes (Addendum)
Patient ID: Randy Combs, male   DOB: 05-31-1966, 56 y.o.   MRN: [id number]    Progress Note   Subjective   Day # 8  CC; postop ileus/EtOH withdrawal requiring sedation and vent support  Attempts to wean a.m. with increased agitation  Relistor 8 /8, and 8/10   KUB today -NG tube into the stomach-diffuse gaseous dilation of the small and large bowel with gas through into the rectum-no significant change  Trickle feeds held last night-restarted this a.m.  Small amount of bright red blood per rectum prior to enema, last p.m. No bowel movement reported today.   Objective   Vital signs in last 24 hours: Temp:  [98.5 F (36.9 C)-99.8 F (37.7 C)] 99.4 F (37.4 C) (08/11 0715) Pulse Rate:  [69-89] 80 (08/11 0800) Resp:  [16-26] 17 (08/11 0800) BP: (107-174)/(67-128) 153/84 (08/11 0800) SpO2:  [95 %-100 %] 98 % (08/11 0807) FiO2 (%):  [30 %] 30 % (08/11 0807) Weight:  [139.8 kg] 139.8 kg (08/11 0500) Last BM Date : 07/14/22 General:    Older white male, sedated on vent, family member at bedside Heart:  Regular rate and rhythm; no murmurs Lungs: Respirations even and unlabored, coarse, on vent Abdomen: Distended, tympanitic, bowel sounds very quiet-quite as tight as yesterday Extremities:  Without edema. Neurologic: Sedated,   Intake/Output from previous day: 08/10 0701 - 08/11 0700 In: 5938.6 [I.V.:4968.7; NG/GT:224.8; IV Piggyback:695.1] Out: 3325 [Urine:2850; Emesis/NG output:475] Intake/Output this shift: No intake/output data recorded.  Lab Results: Recent Labs    07/13/22 0612 07/14/22 0718  WBC 4.7 8.6  HGB 9.0* 8.3*  HCT 27.7* 24.6*  PLT 263 295   BMET Recent Labs    07/12/22 0557 07/13/22 0612 07/14/22 0718  NA 133* 133* 137  K 4.0 3.9 3.6  CL 99 102 107  CO2 22 19* 20*  GLUCOSE 111* 113* 127*  BUN 48* 47* 25*  CREATININE 2.31* 1.78* 1.07  CALCIUM 8.0* 8.1* 8.4*   LFT Recent Labs    07/14/22 0718  PROT 6.3*  ALBUMIN 2.5*  AST 33  ALT  21  ALKPHOS 82  BILITOT 1.1   PT/INR No results for input(s): "LABPROT", "INR" in the last 72 hours.  Studies/Results: DG CHEST PORT 1 VIEW  Result Date: 07/14/2022 CLINICAL DATA:  [id number] and [phone]. Acute respiratory failure with hypoxia. Ileus. EXAM: PORTABLE CHEST 1 VIEW; ABDOMEN 1 VIEW OBTAINED IN 2 FILMS COMPARISON:  Abdomen films of 07/12/2022, and portable chest yesterday at 5:10 a.m. FINDINGS: Chest: 4:52 a.m. ETT tip remains 3.1 cm from the carinal angle. NGT extends well into the stomach with the tip not filmed. Stable cardiomegaly. Increased central vascular prominence noted today without evidence of overt edema. No pleural effusion is seen. Once again body habitus limits assessment of the bases as well as low lung volumes. There are basilar atelectatic bands without a convincing focal pneumonia. Grossly intact thoracic cage. Abdomen: 4:58 a.m. Diffuse gas dilatation of the small and large bowel is again noted with bowel gas through into the rectum. Degree of dilatation is not significantly changed. The tip of the NGT is either in the most distal gastric antrum or duodenal bulb. There is no supine evidence of free air. Visceral shadows appear generally stable as far as seen, with limited subject contrast to body habitus. IMPRESSION: Increased central vascular prominence without overt edema. Stable cardiomegaly. Basilar atelectatic bands with low lung volumes. No convincing acute infiltrate. Stable diffuse gaseous dilatation of the bowel. Electronically Signed  By: Telford Nab M.D.   On: 07/14/2022 05:33   DG Abd 1 View  Result Date: 07/14/2022 CLINICAL DATA:  [id number] and [phone]. Acute respiratory failure with hypoxia. Ileus. EXAM: PORTABLE CHEST 1 VIEW; ABDOMEN 1 VIEW OBTAINED IN 2 FILMS COMPARISON:  Abdomen films of 07/12/2022, and portable chest yesterday at 5:10 a.m. FINDINGS: Chest: 4:52 a.m. ETT tip remains 3.1 cm from the carinal angle. NGT extends well into the stomach with the tip  not filmed. Stable cardiomegaly. Increased central vascular prominence noted today without evidence of overt edema. No pleural effusion is seen. Once again body habitus limits assessment of the bases as well as low lung volumes. There are basilar atelectatic bands without a convincing focal pneumonia. Grossly intact thoracic cage. Abdomen: 4:58 a.m. Diffuse gas dilatation of the small and large bowel is again noted with bowel gas through into the rectum. Degree of dilatation is not significantly changed. The tip of the NGT is either in the most distal gastric antrum or duodenal bulb. There is no supine evidence of free air. Visceral shadows appear generally stable as far as seen, with limited subject contrast to body habitus. IMPRESSION: Increased central vascular prominence without overt edema. Stable cardiomegaly. Basilar atelectatic bands with low lung volumes. No convincing acute infiltrate. Stable diffuse gaseous dilatation of the bowel. Electronically Signed   By: Telford Nab M.D.   On: 07/14/2022 05:33   VAS [place] UPPER EXTREMITY VENOUS DUPLEX  Result Date: 07/13/2022 UPPER VENOUS STUDY  Patient Name:  Randy Combs  Date of Exam:   07/13/2022 Medical Rec #: [id number]           Accession #:    [id number] Date of Birth: Sep 14, 1966            Patient Gender: M Patient Age:   20 years Exam Location:  [facility] Procedure:      VAS [place] UPPER EXTREMITY VENOUS DUPLEX Referring Phys: Remo Lipps SOMMER --------------------------------------------------------------------------------  Indications: Edema Other Indications: 2 IVs to LUE, patient in restraints, arm in dependent position. Limitations: Poor patient positioning, restraints. and line. Comparison Study: No previous exams Performing Technologist: Jody Hill RVT, RDMS  Examination Guidelines: A complete evaluation includes B-mode imaging, spectral Doppler, color Doppler, and power Doppler as needed of all accessible portions of each vessel. Bilateral  testing is considered an integral part of a complete examination. Limited examinations for reoccurring indications may be performed as noted.  Right Findings: +----------+------------+---------+-----------+----------+-------+ RIGHT     CompressiblePhasicitySpontaneousPropertiesSummary +----------+------------+---------+-----------+----------+-------+ Subclavian               No        Yes                      +----------+------------+---------+-----------+----------+-------+ Subclavian vein patent by color and doppler - unable to compression due to patient position. Doppler waveform appears pulsatile.  Left Findings: +----------+------------+---------+-----------+----------+-----------------+ LEFT      CompressiblePhasicitySpontaneousProperties     Summary      +----------+------------+---------+-----------+----------+-----------------+ IJV           Full       No        Yes                                +----------+------------+---------+-----------+----------+-----------------+ Subclavian    Full       No        Yes                                +----------+------------+---------+-----------+----------+-----------------+  Axillary      Full       No        Yes                                +----------+------------+---------+-----------+----------+-----------------+ Brachial      Full       No        Yes                                +----------+------------+---------+-----------+----------+-----------------+ Radial        Full                                                    +----------+------------+---------+-----------+----------+-----------------+ Ulnar         Full                                                    +----------+------------+---------+-----------+----------+-----------------+ Cephalic    Partial      No        No               Age Indeterminate +----------+------------+---------+-----------+----------+-----------------+  Basilic       Full       No        Yes                                +----------+------------+---------+-----------+----------+-----------------+ focal SVT in cephalic vein just below IV insertion site. Ulnar not well visualized due to patient positioning and restraints. Pulsatile doppler waveforms to LUE.  Summary:  Right: No evidence of thrombosis in the subclavian.  Left: No evidence of deep vein thrombosis in the upper extremity. Findings consistent with age indeterminate superficial vein thrombosis involving the left cephalic vein.  *See table(s) above for measurements and observations.     Preliminary    DG CHEST PORT 1 VIEW  Result Date: 07/13/2022 CLINICAL DATA:  [id number]. Bedside accounting for hypoxia. Ventilator dependent respiratory failure. EXAM: PORTABLE CHEST 1 VIEW COMPARISON:  Portable 07/10/2022 FINDINGS: 5:10 a.m. ETT tip is 3.3 cm from carina. NGT extends into the gastric antrum but the proximal side hole and tip are not seen. Cardiomegaly. The central vascular markings are normal caliber. The lungs are generally clear with limited view of the bases due to body habitus. Stable mediastinal configuration IMPRESSION: Stable chest with cardiomegaly. The lungs are generally clear but there is limited visualization of the bases due to body habitus. Support tubes as above. Electronically Signed   By: Telford Nab M.D.   On: 07/13/2022 07:27   DG Abd Portable 1V  Result Date: 07/12/2022 CLINICAL DATA:  Ileus EXAM: PORTABLE ABDOMEN - 1 VIEW COMPARISON:  07/11/2022 FINDINGS: Diffuse gaseous distention of bowel again noted, not significantly changed since prior study. NG tube is in place with the tip in the distal stomach. No free air organomegaly. IMPRESSION: Continued diffuse gaseous distention of bowel compatible with ileus, unchanged. Electronically Signed   By: Rolm Baptise M.D.   On: 07/12/2022 23:33  DG Naso G Tube Plc W/Fl W/Rad  Result Date: 07/12/2022 CLINICAL DATA:  Encounter  for NG tube placement EXAM: NASO G TUBE PLACEMENT WITH FL AND WITH RAD CONTRAST:  None FLUOROSCOPY: Fluoroscopy Time:  1 minute 0 seconds Radiation Exposure Index (if provided by the fluoroscopic device): 15.5 mGy Number of Acquired Spot Images: 2 fluoroscopic images COMPARISON:  Multiple recent abdominal radiograph. FINDINGS: The patient was placed supine on the fluoroscopy table. Initial fluoroscopic image of the abdomen demonstrates gaseous distention of bowel. A 16 Fr nasogastric tube was lubricated and placed into the patient's left nostril. Under fluoroscopy, the nasogastric tube was advanced through the esophagus and into the stomach. There was resistance at the level of the distal esophagus. With gentle rotation and pressure, the tube was successfully advanced into the stomach and there was immediate return of gastric contents through the tube. The tube was taped in place and an image saved demonstrating the nasogastric tube tip and side port overlying the stomach. The tube is ready for immediate use. IMPRESSION: Successful 16 Fr nasogastric tube placement into the stomach. Electronically Signed   By: Maurine Simmering M.D.   On: 07/12/2022 12:50       Assessment / Plan:    #57 56 year old male status postelective knee surgery 07/07/2022 with postop ileus, then development of EtOH withdrawal-agitation/delirium requiring ICU management, sedation /vent support  Ileus has been persistent over the past 5 days.  No improvement on abdominal imaging Patient was able to tolerate trickle feeds yesterday, then placed to low intermittent suction early this morning due to increased abdominal distention Had bowel movements yesterday, none today  He is still requiring low-dose fentanyl  IV Reglan initiated yesterday  Plan; suspect will have difficult time resolving the ileus while sedated, and requiring fentanyl even low-dose   Continue IV Reglan Continue MiraLAX via NG Unlikely to be able to tolerate  increase in tube feedings till we see significant improvement-may want to consider PICC line/TPN-t no that can continue using intermittent suction with NG  Will give him another enema today-noted there was a small amount of bright red blood with attempt last night, likely local anal rectal irritation and we need to continue to try to get his bowel decompressed  Dulcolax suppository daily   Principal Problem:   Unilateral primary osteoarthritis, left knee Active Problems:   Status post total left knee replacement   Acute respiratory failure with hypoxia (HCC)   Ileus, postoperative ([place])     LOS: 3 days   Keanan Melander PA-C 07/14/2022, 8:53 AM

## 2022-07-14 NOTE — Progress Notes (Addendum)
NAME:  Randy Combs, MRN:  [id number], DOB:  Nov 30, 1966, LOS: 3 ADMISSION DATE:  07/07/2022, CONSULTATION DATE:  07/10/22 REFERRING MD:  Margarite Gouge, CHIEF COMPLAINT:  ETOH w/d   History of Present Illness:  56 year old M admitted for elective knee surgery on 07/07/2022.  He underwent local, regional, spinal, and general anesthesia for his procedure.  He was admitted overnight for observation.  Unfortunately postoperatively he has had a complicated course with postop ileus and associated distention causing shortness of breath.  NG tube placed.  He has developed worsened agitation, tachycardia, restlessness, and tremors.  Although initially he indicated he drink few days per week, family indicated that he drinks more than what he had initially endorsed.  Due to concern for significant alcohol withdrawal and DTs he was transferred to the intensive care unit.  He is unable to receive oral benzodiazepines to help control symptoms due to his ileus.  C/o being thirsty and wanting to get out of restraints.  He has had significant agitation and was only intermittently redirectable. Progressed to respiratory failure requiring intubation.   Pertinent  Medical History  Alcohol abuse Obesity DM 2 Gout Hiatal hernia Hypertension  Significant Hospital Events: Including procedures, antibiotic start and stop dates in addition to other pertinent events   8/4 Admit for elective knee surgery 8/5 Abdominal distention, post op ileus 8/6 NG tube placement for gastric decompression 8/7 Transfer to ICU, initiated on Precedex and phenobarbital. Intubated for respiratory failure. 8/9 Remains on vent sedated with distended abdomen. AKI worse today.  8L+ 8/10 Trickle TF initiated.  BM early am.   Interim History / Subjective:  Tmax 99.4 / WBC 8.6  Vent - PEEP 5, FiO2 30% > changed to PSV wean 8/5, tolerating well  Glucose range 125-144  I/O 2.8L UOP, +2.6L in last 24 hours Respiratory culture negative, Blood  cultures pending RN reports small amt bright red blood noted overnight from rectum prior to attempt at enema   Objective   Blood pressure (!) 153/84, pulse 80, temperature 99.4 F (37.4 C), temperature source Axillary, resp. rate 17, height '5\' 10"'$  (1.778 m), weight (!) 139.8 kg, SpO2 98 %.    Vent Mode: PSV FiO2 (%):  [30 %-35 %] 30 % Set Rate:  [20 bmp] 20 bmp Vt Set:  [580 mL] 580 mL PEEP:  [5 cmH20] 5 cmH20 Pressure Support:  [8 cmH20] 8 cmH20 Plateau Pressure:  [19 cmH20-31 cmH20] 24 cmH20   Intake/Output Summary (Last 24 hours) at 07/14/2022 5208 Last data filed at 07/14/2022 0700 Gross per 24 hour  Intake 4261.78 ml  Output 3000 ml  Net 1261.78 ml   Filed Weights   07/07/22 0656 07/14/22 0500  Weight: 123.8 kg (!) 139.8 kg    Examination: General: critically ill appearing adult male lying in bed in NAD on vent   HEENT: MM pink/moist, ETT, pupils =/reactive, mild scleral edema  Neuro: sedate, raises eyebrows to voice  CV: s1s2 RRR, no m/r/g PULM: non-labored at rest on PSV, lungs bilaterally clear, pPlat 20  GI: protuberant, taunt, bsx4 hypoactive  Extremities: warm/dry, generalized 1+ edema  Skin: left knee surgical dressing intact   PCXR 8/11 > bibasilar atx, increased vascular prominence  PABD 8/11 > no change in gaseous distention of bowel    Resolved Hospital Problem list     Assessment & Plan:   Acute Respiratory Failure with Hypoxia Fever, Possible Aspiration Pneumonia / Pneumonitis  Concern ileus with significant abdominal distention is contributing to respiratory issues. No evidence  of aspiration at time of intubation.  ABX started (cefepime + vanco) on 8/8, narrowed.  -PRVC with LTVV  -follow mechanics, may need lower Vt given significant abdominal distention  -follow intermittent CXR  -wean PEEP / FiO2 for sats >90% -SBT / WUA as able  -D4/5 abx, currently on unasyn > stop date added for 8/12 -respiratory cultures negative  Acute Metabolic  Encephalopathy in the setting of ETOH Withdrawal  -continue propofol, precedex  -wean ketamine to off, may need versed pushes vs infusion  -thiamine, folate, MVI  -follow CIWA score   Postoperative Ileus Prior to intubation NG tube was placed but this was dislodged, multiple attempt were made to replace 8/8 but all failed. Radiology to assist in placement under floro 8/9.  History of large hiatal hernia that was repaired 5 years ago this is likely contributing to difficult NG/OG placement.  S/p Relistor 8/8. TF trial initiated 8/10.  -goal K+>4, Mg+>2  -continue trickle TF  -scheduled bowel regimen  -NPO  -appreciate GI   BRBPR  Small amt, suspect trauma related to rectal suppositories and enemas -supportive care  -avoid enemas / rectal insertion of medications as able -continue chemical DVT prophylaxis given risk for DVT & monitor closely   AKI Hyponatremia Hypophosphatemia -Trend BMP / urinary output -Replace electrolytes as indicated -Avoid nephrotoxic agents, ensure adequate renal perfusion -K/Mg+ goals as above  -lasix 40 mg x1 with KCL   Osteoarthritis, s/p left TKA -post operative rec's per Ortho  -PT/OT  -CPM  -judicious use of narcotics given ileus  -scheduled tylenol Q8 for pain  Hyperbilirubinemia, likely due to Ileus vs Sepsis.   Normal liver and gallbladder on recent CT on 8/6 -monitor intermittently   Acute Postop Anemia + component of IDA & Critical Illness  -follow CBC  -transfuse for Hgb <7%  Hyperglycemia, Pre-DM Hgb A1c 6.3 -glucose goal 140-180 -SSI, sensitive scale   LUE SVT of Cephalic Vein  -monitor closely  -elevated LUE  -warm compress to left arm   Best Practice (right click and "Reselect all SmartList Selections" daily)  Diet/type: tubefeeds and NPO DVT prophylaxis: prophylactic heparin  GI prophylaxis: PPI Lines: N/A Foley:  Yes, and it is still needed Code Status:  full code Last date of multidisciplinary goals of care  discussion: 8/7 with wife.  Daughter and wife updated on plan of care 8/10, will update on arrival 8/11  Critical care time: 59 minutes    Noe Gens, MSN, APRN, NP-C, AGACNP-BC [facility] 07/14/2022, 8:22 AM   Please see [email or web address] for pager details.   From 7A-7P if no response, please call [phone] After hours, please call ELink [phone]

## 2022-07-14 NOTE — Progress Notes (Signed)
Patient weaned from 0728 to 1145 am.  On WUA he became significantly agitated, spit out a bite block, increased work of breathing and diaphoretic.  Patient changed back to [facility].  One time '2mg'$  IV versed now. RT notified.  Support offered to family at bedside.     Noe Gens, MSN, APRN, NP-C, AGACNP-BC [facility] 07/14/2022, 11:47 AM   Please see [email or web address] for pager details.   From 7A-7P if no response, please call [phone] After hours, please call ELink [phone]

## 2022-07-14 NOTE — Progress Notes (Addendum)
[place] Progress Note Patient Name: Randy Combs DOB: [birth year] MRN: [id number]   Date of Service  07/14/2022  HPI/Events of Note  Patient short of breath and restless. His NG tube was dislodged and there is a concern for possible aspiration.Also blood pressure elevated.  eICU Interventions  Stat portable CXR and ABG ordered. PRN iv Labetalol ordered.        Frederik Pear 07/14/2022, 9:37 PM

## 2022-07-14 NOTE — Progress Notes (Addendum)
In preparation for an enema/medication  administration this nurse noted bright red rectal bleeding. This nurse informed the charge nurse, obtained an occult stool sample, informed e-link nurse/physician, and held rectal enema/medication. Per discussion with Dr. Levada Schilling to not administer medication rectally until further notice, hold heparin subQ, and order/place SCD's.  SCD's ordered and placed on patient and  continuing with current plan of care.

## 2022-07-14 NOTE — Progress Notes (Signed)
PT back to full support on ventilator (at approximately 1145). PT sedation was reduced and PT exhibited unsafe behavior per NP, Ollis.

## 2022-07-14 NOTE — Progress Notes (Signed)
[place] Progress Note Patient Name: Randy Combs DOB: 25-Apr-1966 MRN: [id number]   Date of Service  07/14/2022  HPI/Events of Note  CXR and ABG reviewed.  eICU Interventions  No intervention.        Frederik Pear 07/14/2022, 10:53 PM

## 2022-07-14 NOTE — Progress Notes (Signed)
ABG collected and send to lab for analysis. Lab notified.  

## 2022-07-14 NOTE — Progress Notes (Signed)
PT Cancellation Note  Patient Details Name: ADILSON GRAFTON MRN: [id number] DOB: 03-18-1966   Cancelled Treatment:    Reason Eval/Treat Not Completed: Medical issues which prohibited therapy, reamins on vent. [place] Office [phone] Weekend pager-[phone]    Claretha Cooper 07/14/2022, 8:17 AM

## 2022-07-14 NOTE — Progress Notes (Signed)
[place] Progress Note Patient Name: Randy Combs DOB: 06-24-1966 MRN: [id number]   Date of Service  07/14/2022  HPI/Events of Note  Nursing reports a small smear/trickle of BRBPR. Hgb = 9.0.  eICU Interventions  Plan: D/C Heparin [place]. Place SCD's/     Intervention Category Major Interventions: Other:  Lysle Dingwall 07/14/2022, 5:41 AM

## 2022-07-14 NOTE — Procedures (Signed)
Extubation Procedure Note  Patient Details:   Name: Randy Combs DOB: Jul 26, 1966 MRN: [id number]   Airway Documentation:    Vent end date: 07/14/22 Vent end time: 1536   Evaluation  O2 sats: currently acceptable Complications: No apparent complications Patient did tolerate procedure well. Bilateral Breath Sounds: Diminished, Clear   Yes (Whisper)  Baldwin [place] Nannette 07/14/2022, 3:37 PM  *Positive cuff leak pre extubation. *Placed on 5 LPM nasal cannula.

## 2022-07-14 NOTE — TOC Progression Note (Signed)
Transition of Care [facility]) - Progression Note    Patient Details  Name: Randy Combs MRN: [id number] Date of Birth: 1966-02-20  Transition of Care [facility]) CM/SW Contact  Leeroy Cha, RN Phone Number: 07/14/2022, 7:29 AM  Clinical Narrative:    Pod 7, remains on the vent.  Following for post op needs and toc needs. Resource for substance abuse added into the dc instructions.     Barriers to Discharge: Continued Medical Work up  Expected Discharge Plan and Services                           DME Arranged: [name] rolling DME Agency: [employer] Date DME Agency Contacted: 07/09/22 Time DME Agency Contacted: 1202 Representative spoke with at DME Agency: [place]: PT [place]: [place] Determinants of Health (SDOH) Interventions    Readmission Risk Interventions     No data to display

## 2022-07-15 ENCOUNTER — Inpatient Hospital Stay (HOSPITAL_COMMUNITY): Payer: 59

## 2022-07-15 DIAGNOSIS — M1712 Unilateral primary osteoarthritis, left knee: Secondary | ICD-10-CM | POA: Diagnosis not present

## 2022-07-15 DIAGNOSIS — N179 Acute kidney failure, unspecified: Secondary | ICD-10-CM | POA: Diagnosis not present

## 2022-07-15 DIAGNOSIS — J9601 Acute respiratory failure with hypoxia: Secondary | ICD-10-CM | POA: Diagnosis not present

## 2022-07-15 DIAGNOSIS — D62 Acute posthemorrhagic anemia: Secondary | ICD-10-CM | POA: Diagnosis not present

## 2022-07-15 DIAGNOSIS — K567 Ileus, unspecified: Secondary | ICD-10-CM | POA: Diagnosis not present

## 2022-07-15 DIAGNOSIS — G928 Other toxic encephalopathy: Secondary | ICD-10-CM | POA: Diagnosis not present

## 2022-07-15 DIAGNOSIS — J690 Pneumonitis due to inhalation of food and vomit: Secondary | ICD-10-CM | POA: Diagnosis not present

## 2022-07-15 DIAGNOSIS — K9189 Other postprocedural complications and disorders of digestive system: Secondary | ICD-10-CM | POA: Diagnosis not present

## 2022-07-15 LAB — GLUCOSE, CAPILLARY
Glucose-Capillary: 136 mg/dL — ABNORMAL HIGH (ref 70–99)
Glucose-Capillary: 141 mg/dL — ABNORMAL HIGH (ref 70–99)
Glucose-Capillary: 144 mg/dL — ABNORMAL HIGH (ref 70–99)
Glucose-Capillary: 145 mg/dL — ABNORMAL HIGH (ref 70–99)
Glucose-Capillary: 147 mg/dL — ABNORMAL HIGH (ref 70–99)
Glucose-Capillary: 149 mg/dL — ABNORMAL HIGH (ref 70–99)

## 2022-07-15 LAB — BASIC METABOLIC PANEL
Anion gap: 14 (ref 5–15)
Anion gap: 13 (ref 5–15)
Anion gap: 12 (ref 5–15)
BUN: 17 mg/dL (ref 6–20)
BUN: 14 mg/dL (ref 6–20)
BUN: 15 mg/dL (ref 6–20)
CO2: 20 mmol/L — ABNORMAL LOW (ref 22–32)
CO2: 17 mmol/L — ABNORMAL LOW (ref 22–32)
CO2: 18 mmol/L — ABNORMAL LOW (ref 22–32)
Calcium: 8.7 mg/dL — ABNORMAL LOW (ref 8.9–10.3)
Calcium: 7 mg/dL — ABNORMAL LOW (ref 8.9–10.3)
Calcium: 6.9 mg/dL — ABNORMAL LOW (ref 8.9–10.3)
Chloride: 107 mmol/L (ref 98–111)
Chloride: 86 mmol/L — ABNORMAL LOW (ref 98–111)
Chloride: 114 mmol/L — ABNORMAL HIGH (ref 98–111)
Creatinine, Ser: 1.04 mg/dL (ref 0.61–1.24)
Creatinine, Ser: 0.8 mg/dL (ref 0.61–1.24)
Creatinine, Ser: 0.86 mg/dL (ref 0.61–1.24)
GFR, Estimated: >60 mL/min (ref >60)
GFR, Estimated: >60 mL/min (ref >60)
GFR, Estimated: >60 mL/min (ref >60)
Glucose, Bld: 141 mg/dL — ABNORMAL HIGH (ref 70–99)
Glucose, Bld: 128 mg/dL — ABNORMAL HIGH (ref 70–99)
Glucose, Bld: 148 mg/dL — ABNORMAL HIGH (ref 70–99)
Potassium: 3.4 mmol/L — ABNORMAL LOW (ref 3.5–5.1)
Potassium: 2.6 mmol/L — CL (ref 3.5–5.1)
Potassium: 2.9 mmol/L — ABNORMAL LOW (ref 3.5–5.1)
Sodium: 141 mmol/L (ref 135–145)
Sodium: 116 mmol/L — CL (ref 135–145)
Sodium: 144 mmol/L (ref 135–145)

## 2022-07-15 LAB — CBC
HCT: 29.9 % — ABNORMAL LOW (ref 39.0–52.0)
Hemoglobin: 9.9 g/dL — ABNORMAL LOW (ref 13.0–17.0)
MCH: 34.7 pg — ABNORMAL HIGH (ref 26.0–34.0)
MCHC: 33.1 g/dL (ref 30.0–36.0)
MCV: 104.9 fL — ABNORMAL HIGH (ref 80.0–100.0)
Platelets: 356 10*3/uL (ref 150–400)
RBC: 2.85 MIL/uL — ABNORMAL LOW (ref 4.22–5.81)
RDW: 15.2 % (ref 11.5–15.5)
WBC: 12.8 10*3/uL — ABNORMAL HIGH (ref 4.0–10.5)
nRBC: 0 % (ref 0.0–0.2)

## 2022-07-15 LAB — AMMONIA: Ammonia: 15 umol/L (ref 9–35)

## 2022-07-15 LAB — MAGNESIUM: Magnesium: 1.8 mg/dL (ref 1.7–2.4)

## 2022-07-15 LAB — PHOSPHORUS
Phosphorus: 1.9 mg/dL — ABNORMAL LOW (ref 2.5–4.6)
Phosphorus: 1.5 mg/dL — ABNORMAL LOW (ref 2.5–4.6)

## 2022-07-15 MED ORDER — CARVEDILOL 12.5 MG PO TABS
12.5000 mg | ORAL_TABLET | Freq: Two times a day (BID) | ORAL | Status: DC
  Administered 2022-07-15 – 2022-07-16 (×3): 12.5 mg via ORAL
  Filled 2022-07-15 (×3): qty 1

## 2022-07-15 MED ORDER — SODIUM CHLORIDE 0.9 % IV SOLN
260.0000 mg | Freq: Once | INTRAVENOUS | Status: DC
  Filled 2022-07-15 (×2): qty 2

## 2022-07-15 MED ORDER — CLONIDINE HCL 0.1 MG PO TABS: 0.1000 mg | ORAL_TABLET | Freq: Three times a day (TID) | ORAL | Status: DC

## 2022-07-15 MED ORDER — DEXMEDETOMIDINE HCL IN NACL 400 MCG/100ML IV SOLN
0.4000 ug/kg/h | INTRAVENOUS | Status: DC
  Administered 2022-07-15 (×5): 1.2 ug/kg/h via INTRAVENOUS
  Administered 2022-07-16: 0.8 ug/kg/h via INTRAVENOUS
  Administered 2022-07-16: 0.6 ug/kg/h via INTRAVENOUS
  Administered 2022-07-16 (×4): 1.2 ug/kg/h via INTRAVENOUS
  Filled 2022-07-15 (×12): qty 100

## 2022-07-15 MED ORDER — MAGNESIUM SULFATE 2 GM/50ML IV SOLN
2.0000 g | Freq: Once | INTRAVENOUS | Status: AC
Start: 2022-07-15 — End: 2022-07-15
  Administered 2022-07-15: 2 g via INTRAVENOUS
  Filled 2022-07-15: qty 50

## 2022-07-15 MED ORDER — CLONIDINE HCL 0.1 MG PO TABS
0.1000 mg | ORAL_TABLET | Freq: Three times a day (TID) | ORAL | Status: DC
  Administered 2022-07-15 (×2): 0.1 mg via ORAL
  Filled 2022-07-15 (×2): qty 1

## 2022-07-15 MED ORDER — LACTULOSE 10 GM/15ML PO SOLN
20.0000 g | Freq: Three times a day (TID) | ORAL | Status: DC
Start: 2022-07-15 — End: 2022-07-16
  Administered 2022-07-15 – 2022-07-16 (×4): 20 g via ORAL
  Filled 2022-07-15 (×4): qty 30

## 2022-07-15 MED ORDER — MELATONIN 3 MG PO TABS: 3.0000 mg | ORAL_TABLET | Freq: Once | ORAL | Status: DC

## 2022-07-15 MED ORDER — SODIUM CHLORIDE 0.9 % IV SOLN: INTRAVENOUS | Status: DC | PRN

## 2022-07-15 MED ORDER — CLONIDINE HCL 0.1 MG PO TABS
0.2000 mg | ORAL_TABLET | Freq: Three times a day (TID) | ORAL | Status: DC
  Administered 2022-07-15 – 2022-07-17 (×5): 0.2 mg via ORAL
  Filled 2022-07-15 (×5): qty 2

## 2022-07-15 MED ORDER — POTASSIUM PHOSPHATES 15 MMOLE/5ML IV SOLN
30.0000 mmol | Freq: Once | INTRAVENOUS | Status: AC
  Administered 2022-07-15: 30 mmol via INTRAVENOUS
  Filled 2022-07-15: qty 10

## 2022-07-15 MED ORDER — CLEVIDIPINE BUTYRATE 0.5 MG/ML IV EMUL
0.0000 mg/h | INTRAVENOUS | Status: AC
  Administered 2022-07-15: 20 mg/h via INTRAVENOUS
  Administered 2022-07-15: 21 mg/h via INTRAVENOUS
  Administered 2022-07-15: 19 mg/h via INTRAVENOUS
  Administered 2022-07-15: 17 mg/h via INTRAVENOUS
  Administered 2022-07-15: 20 mg/h via INTRAVENOUS
  Administered 2022-07-15: 21 mg/h via INTRAVENOUS
  Administered 2022-07-15: 10.5 mg/h via INTRAVENOUS
  Administered 2022-07-15: 21 mg/h via INTRAVENOUS
  Administered 2022-07-15: 1 mg/h via INTRAVENOUS
  Administered 2022-07-15: 11 mg/h via INTRAVENOUS
  Administered 2022-07-16: 10.5 mg/h via INTRAVENOUS
  Filled 2022-07-15: qty 100
  Filled 2022-07-15 (×3): qty 50
  Filled 2022-07-15: qty 100
  Filled 2022-07-15: qty 50
  Filled 2022-07-15: qty 100
  Filled 2022-07-15 (×2): qty 50
  Filled 2022-07-15 (×5): qty 100
  Filled 2022-07-15: qty 50
  Filled 2022-07-15: qty 100

## 2022-07-15 MED ORDER — ORAL CARE MOUTH RINSE: 15.0000 mL | OROMUCOSAL | Status: DC | PRN

## 2022-07-15 MED ORDER — SENNA 8.6 MG PO TABS
1.0000 | ORAL_TABLET | Freq: Every day | ORAL | Status: DC
  Administered 2022-07-15 – 2022-07-20 (×6): 8.6 mg via ORAL
  Filled 2022-07-15 (×6): qty 1

## 2022-07-15 NOTE — Progress Notes (Signed)
[facility] Progress Note Patient Name: Randy Combs DOB: 04/28/1966 MRN: [id number]   Date of Service  07/15/2022  HPI/Events of Note  Patient with markedly elevated blood pressure.  eICU Interventions  Cleviprex gtt ordered.        Deija Buhrman U Silvino Selman 07/15/2022, 1:17 AM

## 2022-07-15 NOTE — Progress Notes (Signed)
[facility] Progress Note Patient Name: Randy Combs DOB: Feb 13, 1966 MRN: [id number]   Date of Service  07/15/2022  HPI/Events of Note  Insomnia .  eICU Interventions  Melatonin 3 mg po x 1 ordered.        Kerry Kass Blandina Renaldo 07/15/2022, 4:24 AM

## 2022-07-15 NOTE — Progress Notes (Signed)
[facility] ADULT ICU REPLACEMENT PROTOCOL   The patient does apply for the [facility] Adult ICU Electrolyte Replacment Protocol based on the criteria listed below:   1.Exclusion criteria: TCTS patients, ECMO patients, and Dialysis patients 2. Is GFR >/= 30 ml/min? Yes.    Patient's GFR today is >60 3. Is SCr </= 2? Yes.   Patient's SCr is 1.04 mg/dL 4. Did SCr increase >/= 0.5 in 24 hours? No. 5.Pt's weight >40kg  Yes.   6. Abnormal electrolyte(s): K+ 3.3, Phos 1.9, Mag 1.8  7. Electrolytes replaced per protocol 8.  Call MD STAT for K+ </= 2.5, Phos </= 1, or Mag </= 1 Physician:  Ignacia Marvel 07/15/2022 4:01 AM

## 2022-07-15 NOTE — Progress Notes (Signed)
NAME:  Randy Combs, MRN:  [id number], DOB:  1966-11-26, LOS: 4 ADMISSION DATE:  07/07/2022, CONSULTATION DATE:  07/10/22 REFERRING MD:  Margarite Gouge, CHIEF COMPLAINT:  ETOH w/d   History of Present Illness:  56 year old M admitted for elective knee surgery on 07/07/2022.  He underwent local, regional, spinal, and general anesthesia for his procedure.  He was admitted overnight for observation.  Unfortunately postoperatively he has had a complicated course with postop ileus and associated distention causing shortness of breath.  NG tube placed.  He has developed worsened agitation, tachycardia, restlessness, and tremors.  Although initially he indicated he drink few days per week, family indicated that he drinks more than what he had initially endorsed.  Due to concern for significant alcohol withdrawal and DTs he was transferred to the intensive care unit.  He is unable to receive oral benzodiazepines to help control symptoms due to his ileus.  C/o being thirsty and wanting to get out of restraints.  He has had significant agitation and was only intermittently redirectable. Progressed to respiratory failure requiring intubation.   Pertinent  Medical History  Alcohol abuse Obesity DM 2 Gout Hiatal hernia Hypertension  Significant Hospital Events: Including procedures, antibiotic start and stop dates in addition to other pertinent events   8/4 Admit for elective knee surgery 8/5 Abdominal distention, post op ileus 8/6 NG tube placement for gastric decompression 8/7 Transfer to ICU, initiated on Precedex and phenobarbital. Intubated for respiratory failure. 8/9 Remains on vent sedated with distended abdomen. AKI worse today.  8L+ 8/10 Trickle TF initiated.  BM early am.  8/11 extubated  Interim History / Subjective:  Extubated but clipping the trees. Hypertensive, tachycardic - concern remains that he's going through some kind of withdrawal though late in course for alcohol. Remains on  precedex. Started on cleviprex.   Objective   Blood pressure (!) 210/88, pulse 90, temperature 99.3 F (37.4 C), temperature source Axillary, resp. rate 19, height '5\' 10"'$  (1.778 m), weight (!) 139.7 kg, SpO2 95 %.    Vent Mode: Volume support FiO2 (%):  [30 %] 30 % Set Rate:  [16 bmp] 16 bmp Vt Set:  [580 mL-650 mL] 650 mL PEEP:  [5 cmH20] 5 cmH20 Pressure Support:  [8 cmH20] 8 cmH20   Intake/Output Summary (Last 24 hours) at 07/15/2022 0715 Last data filed at 07/15/2022 0500 Gross per 24 hour  Intake 1936.15 ml  Output 5850 ml  Net -3913.85 ml   Filed Weights   07/07/22 0656 07/14/22 0500 07/15/22 0500  Weight: 123.8 kg (!) 139.8 kg (!) 139.7 kg    Examination: General: intermittently agitated HEENT: dry MM Neuro: moves all ext spont, follows commands CV: tachy RR PULM: distant bl,  GI: protuberant/distended but not taut, bsx4 hypoactive  Extremities: warm/dry, generalized 1+ edema  Skin: left knee surgical dressing intact   No new culture data  CXR without new opacity, still with low lung volumes   Resolved Hospital Problem list     Assessment & Plan:   Acute Respiratory Failure with Hypoxia Fever, Possible Aspiration Pneumonia / Pneumonitis  Atelectasis Concern ileus with significant abdominal distention is contributing to respiratory issues. No evidence of aspiration at time of intubation.  ABX started (cefepime + vanco) on 8/8, narrowed.  -wean FiO2 for 88-92% -Stop unasyn today -PT/mobilize -chair position, IS -respiratory cultures negative  Acute Metabolic Encephalopathy in the setting of ETOH Withdrawal  -check ammonia, verify opioid use as outpatient in case there is component of opioid withdrawal  -  continue precedex -trial phenobarb although this is late in course, if good response then switch to taper and CIWA based protocol -thiamine  Postoperative Ileus Prior to intubation NG tube was placed but this was dislodged, multiple attempt were made to  replace 8/8 but all failed. Radiology to assisted in placement under floro 8/9.  History of large hiatal hernia that was repaired 5 years ago this is likely contributing to difficult NG/OG placement.  S/p Relistor 8/8. TF trial initiated 8/10.  -goal K+>4, Mg+>2  -continue trickle TF when able to get enteric access  -scheduled bowel regimen  -NPO except sips -appreciate GI  -PT/mobilize as able  BRBPR  Small amt, suspect trauma related to rectal suppositories and enemas -supportive care  -avoid enemas / rectal insertion of medications as able -continue chemical DVT prophylaxis given risk for DVT & monitor closely   AKI Hyponatremia Hypophosphatemia -Trend BMP / urinary output -Replace electrolytes as indicated -Avoid nephrotoxic agents, ensure adequate renal perfusion -K/Mg+ goals as above   Osteoarthritis, s/p left TKA -post operative rec's per Ortho  -PT/OT  -CPM  -judicious use of narcotics given ileus  -scheduled tylenol Q8 for pain  Hyperbilirubinemia, likely due to Ileus vs Sepsis.   Normal liver and gallbladder on recent CT on 8/6 -monitor intermittently   Acute Postop Anemia + component of IDA & Critical Illness  -follow CBC  -transfuse for Hgb <7%  Hyperglycemia, Pre-DM Hgb A1c 6.3 -glucose goal 140-180 -SSI, sensitive scale   LUE SVT of Cephalic Vein  -monitor closely  -elevated LUE  -warm compress to left arm   Best Practice (right click and "Reselect all SmartList Selections" daily)  Diet/type: tubefeeds and NPO DVT prophylaxis: prophylactic heparin  GI prophylaxis: PPI Lines: N/A Foley:  Yes, and it is still needed Code Status:  full code Last date of multidisciplinary goals of care discussion: wife updated at bedside 8/11.  Critical care time: 40 minutes    Fredirick [facility]lin Pulmonary/Critical Care  07/15/2022, 7:15 AM   Please see [email or web address] for pager details.   From 7A-7P if no response, please call [phone] After hours, please  call ELink [phone]

## 2022-07-15 NOTE — Progress Notes (Signed)
[facility] Progress Note Patient Name: Randy Combs DOB: [birth year] MRN: [id number]   Date of Service  07/15/2022  HPI/Events of Note  BP 205/59 despite Cleviprex at 16 mg / hour, raising questions about the reliability of the non-invasive measurements.  eICU Interventions  Arterial line ordered. EKG reviewed.        Kerry Kass Donshay Lupinski 07/15/2022, 6:32 AM

## 2022-07-15 NOTE — Progress Notes (Signed)
   Patient Name: Randy Combs Date of Encounter: 07/15/2022, 8:40 AM    Subjective  Sedated - suspected to still be withdrawing 1 stool overnight   Objective  BP (!) 174/55   Pulse 77   Temp 98.4 F (36.9 C) (Axillary)   Resp 19   Ht '5\' 10"'$  (1.778 m)   Wt (!) 139.7 kg   SpO2 93%   BMI 44.19 kg/m   sedated and asleep Abd obese, quiet soft and nontender ++ tympanitic  Recent Labs  Lab 07/11/22 0214 07/12/22 0557 07/13/22 0612 07/14/22 0718 07/15/22 0251 07/15/22 0651  NA 132* 133* 133* 137 141 116*  K 4.2 4.0 3.9 3.6 3.4* 2.6*  CL 97* 99 102 107 107 86*  CO2 26 22 19* 20* 20* 17*  GLUCOSE 159* 111* 113* 127* 141* 128*  BUN 28* 48* 47* 25* 17 14  CREATININE 1.80* 2.31* 1.78* 1.07 1.04 0.80  CALCIUM 8.6* 8.0* 8.1* 8.4* 8.7* 7.0*  MG 2.1 1.9 2.6* 2.1 1.8  --   PHOS 2.2* 3.1 4.0 1.6* 1.9* 1.5*   Recent Labs  Lab 07/13/22 0612 07/14/22 0718 07/15/22 0251  HGB 9.0* 8.3* 9.9*  HCT 27.7* 24.6* 29.9*  WBC 4.7 8.6 12.8*  PLT 263 295 356     Assessment and Plan  #26 56 year old male status postelective knee surgery 07/07/2022 with postop ileus, then development of EtOH withdrawal-agitation/delirium requiring ICU management, sedation /vent support - extubated yesterday   Ileus has been persistent over the past 6 days.   No improvement on abdominal imaging  today's films pending 1 BM overnight   On IV Reglan, po Senokot, bisacodyl suppository, MiraLax Lactulose added to start today (for encephalopathy) Also on Relistor No fentanyl yesterday   Unasyn dc today (empiric)  Liver was NL on imaging (CT) - no cirrhosis - cannot r/o dysfunction - lactulose added - may increase gaseousnous so beware - NH3 pending    BMP markedly different today - ? Spurious recheck pending  Gatha Mayer, MD, [facility] See Shea Evans on call - gastroenterology for best contact person 07/15/2022 8:40 AM

## 2022-07-15 NOTE — Progress Notes (Signed)
Physical Therapy Treatment Patient Details Name: Randy Combs MRN: [id number] DOB: [birth year] Today's Date: 07/15/2022   History of Present Illness 56 yo male S/P LTKA 07/07/22; 8/5 pt developed post op ileus with abdominal distention; 8/6 NG tube placed; 8/7 Pt to ICU, Precedex initiated, pt intubated 2* respiratory failure; 8/11 pt extubated PMH: gout, DM, glaucoma,  P    PT Comments    Pt extubated yesterday and RN reports cooperative, following cues and able to participate with limited PT - bed level exercises but OOB deferred at this point.  Pt performed therex program with assist and with multiple rest breaks required.  L LE positioned to promote knee extension in resting position - pt, spouse and RN educated on importance of same  Recommendations for follow up therapy are one component of a multi-disciplinary discharge planning process, led by the attending physician.  Recommendations may be updated based on patient status, additional functional criteria and insurance authorization.  Follow Up Recommendations  Follow physician's recommendations for discharge plan and follow up therapies     Assistance Recommended at Discharge Intermittent Supervision/Assistance  Patient can return home with the following A little help with walking and/or transfers;Assistance with cooking/housework;Assist for transportation;Help with stairs or ramp for entrance;A little help with bathing/dressing/bathroom   Equipment Recommendations  None recommended by PT    Recommendations for Other Services       Precautions / Restrictions Precautions Precautions: Fall;Knee Precaution Comments: no pillow under knee, NGT, currently on 5L O2 [place] Required Braces or Orthoses: Knee Immobilizer - Left Knee Immobilizer - Right: Discontinue once straight leg raise with < 10 degree lag Knee Immobilizer - Left: Discontinue once straight leg raise with < 10 degree lag Restrictions Weight Bearing Restrictions:  Yes LLE Weight Bearing: Weight bearing as tolerated     Mobility  Bed Mobility               General bed mobility comments: OOB deferred per RN    Transfers                        Ambulation/Gait                   Stairs             Wheelchair Mobility    Modified Rankin (Stroke Patients Only)       Balance                                            Cognition Arousal/Alertness: Awake/alert   Overall Cognitive Status: Within Functional Limits for tasks assessed                                 General Comments: Pt very pleasant and cooperative despite pain        Exercises Total Joint Exercises Ankle Circles/Pumps: AROM, Both, 20 reps, Supine Quad Sets: AROM, Both, 10 reps Heel Slides: Left, AAROM, Supine, Other (comment) (6 reps - limited by increasing BP) Straight Leg Raises: AAROM, Left, 15 reps, Supine Goniometric ROM: grossly -15 - 30 degrees pain limited    General Comments        Pertinent Vitals/Pain Pain Assessment Pain Assessment: Faces Faces Pain Scale: Hurts whole lot Pain Location: L knee with movement Pain Descriptors /  Indicators: Grimacing, Guarding, Sore, Throbbing Pain Intervention(s): Limited activity within patient's tolerance, Monitored during session (Pt unable to premed 2* ileus)    Home Living                          Prior Function            PT Goals (current goals can now be found in the care plan section) Acute Rehab PT Goals Patient Stated Goal: Regain IND PT Goal Formulation: With patient/family Time For Goal Achievement: 07/14/22 Potential to Achieve Goals: Good Progress towards PT goals: Progressing toward goals    Frequency    7X/week      PT Plan Current plan remains appropriate    Co-evaluation              AM-PAC PT "6 Clicks" Mobility   Outcome Measure  Help needed turning from your back to your side while in a flat  bed without using bedrails?: A Lot Help needed moving from lying on your back to sitting on the side of a flat bed without using bedrails?: A Lot Help needed moving to and from a bed to a chair (including a wheelchair)?: A Lot Help needed standing up from a chair using your arms (e.g., wheelchair or bedside chair)?: Total Help needed to walk in hospital room?: Total Help needed climbing 3-5 steps with a railing? : Total 6 Click Score: 9    End of Session Equipment Utilized During Treatment: Oxygen Activity Tolerance: Patient limited by fatigue;Patient limited by pain Patient left: in bed;with call bell/phone within reach;with family/visitor present Nurse Communication: Mobility status PT Visit Diagnosis: Difficulty in walking, not elsewhere classified (R26.2);Unsteadiness on feet (R26.81);Pain Pain - Right/Left: Left Pain - part of body: Knee     Time: 8341-9622 PT Time Calculation (min) (ACUTE ONLY): 22 min  Charges:  $Therapeutic Exercise: 8-22 mins                     Yonah Pager [phone] Office [phone]    [facility] 07/15/2022, 4:58 PM

## 2022-07-16 DIAGNOSIS — K9189 Other postprocedural complications and disorders of digestive system: Secondary | ICD-10-CM | POA: Diagnosis not present

## 2022-07-16 DIAGNOSIS — M1712 Unilateral primary osteoarthritis, left knee: Secondary | ICD-10-CM | POA: Diagnosis not present

## 2022-07-16 DIAGNOSIS — J690 Pneumonitis due to inhalation of food and vomit: Secondary | ICD-10-CM | POA: Diagnosis not present

## 2022-07-16 DIAGNOSIS — K567 Ileus, unspecified: Secondary | ICD-10-CM | POA: Diagnosis not present

## 2022-07-16 LAB — CULTURE, BLOOD (ROUTINE X 2)
Culture: NO GROWTH
Culture: NO GROWTH
Special Requests: ADEQUATE
Special Requests: ADEQUATE

## 2022-07-16 LAB — GLUCOSE, CAPILLARY
Glucose-Capillary: 141 mg/dL — ABNORMAL HIGH (ref 70–99)
Glucose-Capillary: 131 mg/dL — ABNORMAL HIGH (ref 70–99)
Glucose-Capillary: 149 mg/dL — ABNORMAL HIGH (ref 70–99)
Glucose-Capillary: 187 mg/dL — ABNORMAL HIGH (ref 70–99)
Glucose-Capillary: 131 mg/dL — ABNORMAL HIGH (ref 70–99)
Glucose-Capillary: 144 mg/dL — ABNORMAL HIGH (ref 70–99)

## 2022-07-16 LAB — COMPREHENSIVE METABOLIC PANEL
ALT: 31 U/L (ref 0–44)
AST: 49 U/L — ABNORMAL HIGH (ref 15–41)
Albumin: 2.9 g/dL — ABNORMAL LOW (ref 3.5–5.0)
Alkaline Phosphatase: 93 U/L (ref 38–126)
Anion gap: 12 (ref 5–15)
BUN: 16 mg/dL (ref 6–20)
CO2: 23 mmol/L (ref 22–32)
Calcium: 8.9 mg/dL (ref 8.9–10.3)
Chloride: 106 mmol/L (ref 98–111)
Creatinine, Ser: 0.81 mg/dL (ref 0.61–1.24)
GFR, Estimated: >60 mL/min (ref >60)
Glucose, Bld: 161 mg/dL — ABNORMAL HIGH (ref 70–99)
Potassium: 3.3 mmol/L — ABNORMAL LOW (ref 3.5–5.1)
Sodium: 141 mmol/L (ref 135–145)
Total Bilirubin: 1 mg/dL (ref 0.3–1.2)
Total Protein: 6.9 g/dL (ref 6.5–8.1)

## 2022-07-16 LAB — MAGNESIUM: Magnesium: 2.1 mg/dL (ref 1.7–2.4)

## 2022-07-16 LAB — CBC
HCT: 33.6 % — ABNORMAL LOW (ref 39.0–52.0)
Hemoglobin: 11.3 g/dL — ABNORMAL LOW (ref 13.0–17.0)
MCH: 34.2 pg — ABNORMAL HIGH (ref 26.0–34.0)
MCHC: 33.6 g/dL (ref 30.0–36.0)
MCV: 101.8 fL — ABNORMAL HIGH (ref 80.0–100.0)
Platelets: 388 10*3/uL (ref 150–400)
RBC: 3.3 MIL/uL — ABNORMAL LOW (ref 4.22–5.81)
RDW: 15.2 % (ref 11.5–15.5)
WBC: 14.6 10*3/uL — ABNORMAL HIGH (ref 4.0–10.5)
nRBC: 0.2 % (ref 0.0–0.2)

## 2022-07-16 MED ORDER — POTASSIUM CHLORIDE 10 MEQ/100ML IV SOLN
10.0000 meq | INTRAVENOUS | Status: AC
  Administered 2022-07-16 (×6): 10 meq via INTRAVENOUS
  Filled 2022-07-16 (×6): qty 100

## 2022-07-16 MED ORDER — ACETAMINOPHEN 325 MG PO TABS
650.0000 mg | ORAL_TABLET | Freq: Once | ORAL | Status: AC
  Administered 2022-07-16: 650 mg via ORAL
  Filled 2022-07-16: qty 2

## 2022-07-16 NOTE — Progress Notes (Signed)
NAME:  Randy Combs, MRN:  [id number], DOB:  05/20/1966, LOS: 5 ADMISSION DATE:  07/07/2022, CONSULTATION DATE:  07/10/22 REFERRING MD:  Margarite Gouge, CHIEF COMPLAINT:  ETOH w/d   History of Present Illness:  56 year old M admitted for elective knee surgery on 07/07/2022.  He underwent local, regional, spinal, and general anesthesia for his procedure.  He was admitted overnight for observation.  Unfortunately postoperatively he has had a complicated course with postop ileus and associated distention causing shortness of breath.  NG tube placed.  He has developed worsened agitation, tachycardia, restlessness, and tremors.  Although initially he indicated he drink few days per week, family indicated that he drinks more than what he had initially endorsed.  Due to concern for significant alcohol withdrawal and DTs he was transferred to the intensive care unit.  He is unable to receive oral benzodiazepines to help control symptoms due to his ileus.  C/o being thirsty and wanting to get out of restraints.  He has had significant agitation and was only intermittently redirectable. Progressed to respiratory failure requiring intubation.   Pertinent  Medical History  Alcohol abuse Obesity DM 2 Gout Hiatal hernia Hypertension  Significant Hospital Events: Including procedures, antibiotic start and stop dates in addition to other pertinent events   8/4 Admit for elective knee surgery 8/5 Abdominal distention, post op ileus 8/6 NG tube placement for gastric decompression 8/7 Transfer to ICU, initiated on Precedex and phenobarbital. Intubated for respiratory failure. 8/9 Remains on vent sedated with distended abdomen. AKI worse today.  8L+ 8/10 Trickle TF initiated.  BM early am.  8/11 extubated 8/12 cleviprex gtt started for HTN  Interim History / Subjective:  Titrating up clonidine for HTN, possible lingering component of withdrawal. Weaning off cleviprex today.   Objective   Blood pressure (!)  159/95, pulse 81, temperature 98.4 F (36.9 C), temperature source Axillary, resp. rate 16, height '5\' 10"'$  (1.778 m), weight (!) 138.5 kg, SpO2 100 %.        Intake/Output Summary (Last 24 hours) at 07/16/2022 0718 Last data filed at 07/16/2022 0701 Gross per 24 hour  Intake 3093.46 ml  Output 1700 ml  Net 1393.46 ml   Filed Weights   07/14/22 0500 07/15/22 0500 07/16/22 0500  Weight: (!) 139.8 kg (!) 139.7 kg (!) 138.5 kg    Examination: General: calm, NAD HEENT: dry MM Neuro: moves all ext spont, follows commands, more attentive today CV: RRR PULM: distant bl, equal chest rise GI: protuberant/distended but not taut, bsx4 hypoactive  Extremities: warm/dry, generalized 1+ edema  Skin: left knee surgical dressing intact   No new culture data  KUB 8/12 without significant change   Resolved Hospital Problem list    AKI  Assessment & Plan:   Acute Respiratory Failure with Hypoxia Fever, Possible Aspiration Pneumonia / Pneumonitis  Atelectasis Improving. Concern ileus with significant abdominal distention is contributing to poor resp mechanics. Component of aspiration pneumonia s/p course of unasyn. -wean FiO2 for 88-92% -PT/mobilize -chair position, IS  Hypertensive urgency - wean cleviprex for SBP 180 - titrating up clonidine as below - coreg 12.5 BID  Acute Metabolic Encephalopathy in the setting of ETOH Withdrawal  -wean precedex as able -titrating up clonidine -thiamine  Postoperative Ileus Prior to intubation NG tube was placed but this was dislodged, multiple attempt were made to replace 8/8 but all failed. Radiology to assisted in placement under floro 8/9.  History of large hiatal hernia that was repaired 5 years ago this is likely contributing  to difficult NG/OG placement.  S/p Relistor 8/8. TF trial initiated 8/10 and stopped 8/12 after dislodgment of NG.  -goal K+>4, Mg+>2  -trial clear liquid diet today -scheduled bowel regimen  -appreciate GI   -PT/mobilize as able  BRBPR  Small amt, suspect trauma related to rectal suppositories and enemas -supportive care  -avoid enemas / rectal insertion of medications as able -continue chemical DVT prophylaxis given risk for DVT & monitor closely   Osteoarthritis, s/p left TKA -post operative rec's per Ortho  -PT/OT  -CPM  -judicious use of narcotics given ileus  -scheduled tylenol Q8 for pain  Hyperbilirubinemia, likely due to Ileus vs Sepsis.   Normal liver and gallbladder on recent CT on 8/6 -monitor intermittently   Acute Postop Anemia + component of IDA & Critical Illness  -follow CBC  -transfuse for Hgb <7%  Hyperglycemia, Pre-DM Hgb A1c 6.3 -glucose goal 140-180 -SSI, sensitive scale   LUE SVT of Cephalic Vein  -monitor closely  -elevated LUE  -warm compress to left arm   Best Practice (right click and "Reselect all SmartList Selections" daily)  Diet/type: clear liquids DVT prophylaxis: prophylactic heparin  GI prophylaxis: PPI Lines: N/A Foley:  Yes, and it is still needed - will evaluate for purewick today Code Status:  full code Last date of multidisciplinary goals of care discussion: wife updated at bedside 8/11.  Critical care time: 34 minutes    Fredirick [facility] Pulmonary/Critical Care  07/16/2022, 7:18 AM   Please see [email or web address] for pager details.   From 7A-7P if no response, please call [phone] After hours, please call ELink [phone]

## 2022-07-16 NOTE — Progress Notes (Signed)
   Patient Name: Randy Combs Date of Encounter: 07/16/2022, 9:40 AM    Subjective  Weaning sedation today - not very interactive - large BM overnight per RN   Objective  BP (!) 164/89   Pulse 70   Temp 98.1 F (36.7 C) (Axillary)   Resp 16   Ht '5\' 10"'$  (1.778 m)   Wt (!) 138.5 kg   SpO2 100%   BMI 43.81 kg/m  Obese wm - sedated - opens eyes - non-verbal Abd obese soft, tympanitic and NT BS - few  Lab Results  Component Value Date   WBC 14.6 (H) 07/16/2022   HGB 11.3 (L) 07/16/2022   HCT 33.6 (L) 07/16/2022   MCV 101.8 (H) 07/16/2022   PLT 388 07/16/2022   Lab Results  Component Value Date   CREATININE 0.81 07/16/2022   BUN 16 07/16/2022   NA 141 07/16/2022   K 3.3 (L) 07/16/2022   CL 106 07/16/2022   CO2 23 07/16/2022   Mg 2.1  DG Abd Portable 1V CLINICAL DATA:  Nausea and vomiting.  Abdominal distention.  EXAM: PORTABLE ABDOMEN - 1 VIEW  COMPARISON:  July 14, 2022  FINDINGS: Dilated loops of bowel again identified. There clearly dilated loops of small bowel. There appears to be air within at least the proximal colon. No gas in the rectum. No other abnormalities.  IMPRESSION: Continued small bowel dilatation. There is also air in at least the proximal colon and a small amount of air in the distal descending colon. The findings likely represent ileus a as previously reported.  Electronically Signed   By: Dorise Bullion III M.D.   On: 07/15/2022 11:21      Assessment and Plan  #28 56 year old male status postelective knee surgery 07/07/2022 with postop ileus, then development of EtOH withdrawal-agitation/delirium requiring ICU management, sedation /vent support - extubated yesterday   Ileus has been persistent over the past 7 days.    1 BM overnight   On IV Reglan, po Senokot, bisacodyl suppository, MiraLax Lactulose added yesterday (for encephalopathy? - NH 3 15) Also on Relistor No fentanyl yesterday  K better, Mg ok  Agree w/  trial of clears  I am stopping lactulose due to gaseous distention potential and NL NH3   Gatha Mayer, MD, [facility] See Shea Evans on call - gastroenterology for best contact person 07/16/2022 9:40 AM

## 2022-07-16 NOTE — Progress Notes (Signed)
Physical Therapy Treatment Patient Details Name: Randy Combs MRN: [id number] DOB: January 06, 1966 Today's Date: 07/16/2022   History of Present Illness 56 yo male S/P LTKA 07/07/22; 8/5 pt developed post op ileus with abdominal distention; 8/6 NG tube placed; 8/7 Pt to ICU, Precedex initiated, pt intubated 2* respiratory failure; 8/11 pt extubated PMH: gout, DM, glaucoma,  P    PT Comments    Pt slighty more lethargic likely 2* recent Fentanyl  but agreeble to and participating with therex program.  OOB deferred at request of pt 2* pain, fatigue and ongoing diarrhea with rectal tube in place.  Will follow and progress as tolerated.  Recommendations for follow up therapy are one component of a multi-disciplinary discharge planning process, led by the attending physician.  Recommendations may be updated based on patient status, additional functional criteria and insurance authorization.  Follow Up Recommendations  Follow physician's recommendations for discharge plan and follow up therapies     Assistance Recommended at Discharge Frequent or constant Supervision/Assistance  Patient can return home with the following Two people to help with walking and/or transfers;Two people to help with bathing/dressing/bathroom;Assist for transportation;Help with stairs or ramp for entrance;Assistance with cooking/housework   Equipment Recommendations  None recommended by PT    Recommendations for Other Services       Precautions / Restrictions Precautions Precautions: Fall;Knee Precaution Comments: no pillow under knee, NGT, currently on 5L O2 [place] Required Braces or Orthoses: Knee Immobilizer - Left Knee Immobilizer - Right: Discontinue once straight leg raise with < 10 degree lag Knee Immobilizer - Left: Discontinue once straight leg raise with < 10 degree lag Restrictions Weight Bearing Restrictions: No LLE Weight Bearing: Weight bearing as tolerated Other Position/Activity Restrictions: WBAT      Mobility  Bed Mobility               General bed mobility comments: OOB deferred at pt request 2* pain, fatigue and ongoing diarrhea with rectal tube in place    Transfers                        Ambulation/Gait                   Stairs             Wheelchair Mobility    Modified Rankin (Stroke Patients Only)       Balance                                            Cognition Arousal/Alertness: Awake/alert Behavior During Therapy: Restless, Anxious Overall Cognitive Status: Within Functional Limits for tasks assessed                                 General Comments: Pt very pleasant and cooperative despite pain but more lethargic than last session - ?Fentanyl prior to session        Exercises Total Joint Exercises Ankle Circles/Pumps: AROM, Both, 20 reps, Supine Quad Sets: AROM, Both, 10 reps Heel Slides: Left, AAROM, Supine, 10 reps Straight Leg Raises: AAROM, Left, 15 reps, Supine Goniometric ROM: grossly -8 - 35 AAROM at L knee    General Comments        Pertinent Vitals/Pain Pain Assessment Pain Assessment: Faces Faces Pain Scale: Hurts whole  lot Pain Location: L knee with movement Pain Descriptors / Indicators: Grimacing, Guarding, Sore, Throbbing Pain Intervention(s): Limited activity within patient's tolerance, Monitored during session, Premedicated before session, Ice applied    Home Living                          Prior Function            PT Goals (current goals can now be found in the care plan section) Acute Rehab PT Goals Patient Stated Goal: Regain IND PT Goal Formulation: With patient/family Time For Goal Achievement: 07/14/22 Potential to Achieve Goals: Good Progress towards PT goals: Progressing toward goals    Frequency    7X/week      PT Plan Current plan remains appropriate    Co-evaluation              AM-PAC PT "6 Clicks" Mobility    Outcome Measure  Help needed turning from your back to your side while in a flat bed without using bedrails?: A Lot Help needed moving from lying on your back to sitting on the side of a flat bed without using bedrails?: A Lot Help needed moving to and from a bed to a chair (including a wheelchair)?: Total Help needed standing up from a chair using your arms (e.g., wheelchair or bedside chair)?: Total Help needed to walk in hospital room?: Total Help needed climbing 3-5 steps with a railing? : Total 6 Click Score: 8    End of Session Equipment Utilized During Treatment: Oxygen Activity Tolerance: Patient limited by fatigue;Patient limited by pain Patient left: in bed;with call bell/phone within reach;with family/visitor present Nurse Communication: Mobility status PT Visit Diagnosis: Difficulty in walking, not elsewhere classified (R26.2);Unsteadiness on feet (R26.81);Pain Pain - Right/Left: Left Pain - part of body: Knee     Time: 1350-1417 PT Time Calculation (min) (ACUTE ONLY): 27 min  Charges:  $Therapeutic Exercise: 8-22 mins                     Debe Coder PT Acute Rehabilitation Services Pager [phone] Office [phone]    [facility] 07/16/2022, 3:33 PM

## 2022-07-16 NOTE — Progress Notes (Signed)
[facility] ADULT ICU REPLACEMENT PROTOCOL   The patient does apply for the [facility] Adult ICU Electrolyte Replacment Protocol based on the criteria listed below:   1.Exclusion criteria: TCTS patients, ECMO patients, and Dialysis patients 2. Is GFR >/= 30 ml/min? Yes.    Patient's GFR today is >60 3. Is SCr </= 2? Yes.   Patient's SCr is 0.81 mg/dL 4. Did SCr increase >/= 0.5 in 24 hours? No. 5.Pt's weight >40kg  Yes.   6. Abnormal electrolyte(s): K  7. Electrolytes replaced per protocol 8.  Call MD STAT for K+ </= 2.5, Phos </= 1, or Mag </= 1 Physician:  Luci Bank [facility] 07/16/2022 4:19 AM

## 2022-07-17 ENCOUNTER — Inpatient Hospital Stay (HOSPITAL_COMMUNITY): Payer: 59

## 2022-07-17 DIAGNOSIS — J690 Pneumonitis due to inhalation of food and vomit: Secondary | ICD-10-CM | POA: Diagnosis not present

## 2022-07-17 DIAGNOSIS — K567 Ileus, unspecified: Secondary | ICD-10-CM | POA: Diagnosis not present

## 2022-07-17 DIAGNOSIS — M1712 Unilateral primary osteoarthritis, left knee: Secondary | ICD-10-CM | POA: Diagnosis not present

## 2022-07-17 LAB — COMPREHENSIVE METABOLIC PANEL
ALT: 46 U/L — ABNORMAL HIGH (ref 0–44)
AST: 75 U/L — ABNORMAL HIGH (ref 15–41)
Albumin: 2.5 g/dL — ABNORMAL LOW (ref 3.5–5.0)
Alkaline Phosphatase: 100 U/L (ref 38–126)
Anion gap: 8 (ref 5–15)
BUN: 14 mg/dL (ref 6–20)
CO2: 26 mmol/L (ref 22–32)
Calcium: 8.3 mg/dL — ABNORMAL LOW (ref 8.9–10.3)
Chloride: 102 mmol/L (ref 98–111)
Creatinine, Ser: 0.8 mg/dL (ref 0.61–1.24)
GFR, Estimated: >60 mL/min (ref >60)
Glucose, Bld: 151 mg/dL — ABNORMAL HIGH (ref 70–99)
Potassium: 3.2 mmol/L — ABNORMAL LOW (ref 3.5–5.1)
Sodium: 136 mmol/L (ref 135–145)
Total Bilirubin: 1.1 mg/dL (ref 0.3–1.2)
Total Protein: 6.6 g/dL (ref 6.5–8.1)

## 2022-07-17 LAB — CBC
HCT: 33.6 % — ABNORMAL LOW (ref 39.0–52.0)
Hemoglobin: 11.2 g/dL — ABNORMAL LOW (ref 13.0–17.0)
MCH: 35.2 pg — ABNORMAL HIGH (ref 26.0–34.0)
MCHC: 33.3 g/dL (ref 30.0–36.0)
MCV: 105.7 fL — ABNORMAL HIGH (ref 80.0–100.0)
Platelets: 332 10*3/uL (ref 150–400)
RBC: 3.18 MIL/uL — ABNORMAL LOW (ref 4.22–5.81)
RDW: 15.7 % — ABNORMAL HIGH (ref 11.5–15.5)
WBC: 14 10*3/uL — ABNORMAL HIGH (ref 4.0–10.5)
nRBC: 0.2 % (ref 0.0–0.2)

## 2022-07-17 LAB — GLUCOSE, CAPILLARY
Glucose-Capillary: 148 mg/dL — ABNORMAL HIGH (ref 70–99)
Glucose-Capillary: 126 mg/dL — ABNORMAL HIGH (ref 70–99)
Glucose-Capillary: 143 mg/dL — ABNORMAL HIGH (ref 70–99)
Glucose-Capillary: 166 mg/dL — ABNORMAL HIGH (ref 70–99)
Glucose-Capillary: 99 mg/dL (ref 70–99)

## 2022-07-17 LAB — MAGNESIUM: Magnesium: 1.6 mg/dL — ABNORMAL LOW (ref 1.7–2.4)

## 2022-07-17 MED ORDER — CLONIDINE HCL 0.3 MG PO TABS: 0.3000 mg | ORAL_TABLET | Freq: Three times a day (TID) | ORAL | Status: DC

## 2022-07-17 MED ORDER — MAGNESIUM SULFATE 4 GM/100ML IV SOLN
4.0000 g | Freq: Once | INTRAVENOUS | Status: AC
  Administered 2022-07-17: 4 g via INTRAVENOUS
  Filled 2022-07-17: qty 100

## 2022-07-17 MED ORDER — OXYCODONE HCL 5 MG PO TABS
5.0000 mg | ORAL_TABLET | ORAL | Status: DC | PRN
  Administered 2022-07-17 – 2022-07-20 (×12): 10 mg via ORAL
  Filled 2022-07-17 (×12): qty 2

## 2022-07-17 MED ORDER — FENTANYL CITRATE PF 50 MCG/ML IJ SOSY
50.0000 ug | PREFILLED_SYRINGE | INTRAMUSCULAR | Status: DC | PRN
  Administered 2022-07-17: 50 ug via INTRAVENOUS
  Filled 2022-07-17: qty 1

## 2022-07-17 MED ORDER — DOCUSATE SODIUM 50 MG/5ML PO LIQD
100.0000 mg | Freq: Two times a day (BID) | ORAL | Status: DC
  Administered 2022-07-17 – 2022-07-18 (×3): 100 mg via ORAL
  Filled 2022-07-17 (×2): qty 10

## 2022-07-17 MED ORDER — CLONIDINE HCL 0.1 MG PO TABS: 0.2000 mg | ORAL_TABLET | Freq: Three times a day (TID) | ORAL | Status: DC

## 2022-07-17 MED ORDER — POTASSIUM CHLORIDE 10 MEQ/100ML IV SOLN
10.0000 meq | INTRAVENOUS | Status: DC
  Administered 2022-07-17: 10 meq via INTRAVENOUS
  Filled 2022-07-17: qty 100

## 2022-07-17 MED ORDER — CARVEDILOL 25 MG PO TABS
25.0000 mg | ORAL_TABLET | Freq: Two times a day (BID) | ORAL | Status: DC
  Administered 2022-07-17 – 2022-07-20 (×7): 25 mg via ORAL
  Filled 2022-07-17: qty 1
  Filled 2022-07-17: qty 2
  Filled 2022-07-17 (×5): qty 1

## 2022-07-17 MED ORDER — CLONIDINE HCL 0.1 MG PO TABS
0.1000 mg | ORAL_TABLET | Freq: Two times a day (BID) | ORAL | Status: AC
  Administered 2022-07-17 – 2022-07-18 (×4): 0.1 mg via ORAL
  Filled 2022-07-17 (×3): qty 1

## 2022-07-17 MED ORDER — CLONIDINE HCL 0.1 MG PO TABS
0.1000 mg | ORAL_TABLET | Freq: Every day | ORAL | Status: AC
  Administered 2022-07-19 – 2022-07-20 (×2): 0.1 mg via ORAL
  Filled 2022-07-17 (×3): qty 1

## 2022-07-17 MED ORDER — FENTANYL CITRATE PF 50 MCG/ML IJ SOSY
50.0000 ug | PREFILLED_SYRINGE | INTRAMUSCULAR | Status: DC | PRN
  Administered 2022-07-17: 50 ug via INTRAVENOUS

## 2022-07-17 MED ORDER — AMLODIPINE BESYLATE 10 MG PO TABS
10.0000 mg | ORAL_TABLET | Freq: Every day | ORAL | Status: DC
  Administered 2022-07-17 – 2022-07-20 (×4): 10 mg via ORAL
  Filled 2022-07-17 (×4): qty 1

## 2022-07-17 MED ORDER — INSULIN ASPART 100 UNIT/ML IJ SOLN
0.0000 [IU] | Freq: Three times a day (TID) | INTRAMUSCULAR | Status: DC
  Administered 2022-07-17: 2 [IU] via SUBCUTANEOUS
  Administered 2022-07-17 – 2022-07-18 (×2): 3 [IU] via SUBCUTANEOUS
  Administered 2022-07-18: 2 [IU] via SUBCUTANEOUS
  Administered 2022-07-19: 3 [IU] via SUBCUTANEOUS
  Administered 2022-07-19 (×2): 2 [IU] via SUBCUTANEOUS

## 2022-07-17 MED ORDER — ACETAMINOPHEN 325 MG PO TABS
650.0000 mg | ORAL_TABLET | Freq: Four times a day (QID) | ORAL | Status: DC
  Administered 2022-07-17 – 2022-07-20 (×13): 650 mg via ORAL
  Filled 2022-07-17 (×13): qty 2

## 2022-07-17 MED ORDER — PANTOPRAZOLE SODIUM 40 MG PO TBEC
40.0000 mg | DELAYED_RELEASE_TABLET | Freq: Every day | ORAL | Status: DC
  Administered 2022-07-17 – 2022-07-20 (×4): 40 mg via ORAL
  Filled 2022-07-17 (×4): qty 1

## 2022-07-17 MED ORDER — POTASSIUM CHLORIDE CRYS ER 20 MEQ PO TBCR
40.0000 meq | EXTENDED_RELEASE_TABLET | Freq: Two times a day (BID) | ORAL | Status: AC
  Administered 2022-07-17 (×2): 40 meq via ORAL
  Filled 2022-07-17 (×2): qty 2

## 2022-07-17 MED ORDER — COLCHICINE 0.6 MG PO TABS
0.6000 mg | ORAL_TABLET | Freq: Two times a day (BID) | ORAL | Status: DC
  Administered 2022-07-17 – 2022-07-20 (×7): 0.6 mg via ORAL
  Filled 2022-07-17 (×8): qty 1

## 2022-07-17 MED ORDER — COLCHICINE 0.6 MG PO TABS
0.6000 mg | ORAL_TABLET | Freq: Two times a day (BID) | ORAL | Status: DC
  Filled 2022-07-17: qty 1

## 2022-07-17 NOTE — Progress Notes (Signed)
Physical Therapy Treatment Patient Details Name: Randy Combs MRN: [id number] DOB: February 09, 1966 Today's Date: 07/17/2022   History of Present Illness 56 yo male S/P LTKA 07/07/22; 8/5 pt developed post op ileus with abdominal distention; 8/6 NG tube placed; 8/7 Pt to ICU, Precedex initiated, pt intubated 2* respiratory failure; 8/11 pt extubated PMH: gout, DM, glaucoma,  P    PT Comments    POD # 10 L TKR + ILEUS + VDRF (intubated 8/7 then extubated 8/11) Seen in room 1325 as pt transferred out of ICU earlier today.  General bed mobility comments: pt required MAX Assist to transition to EOB due to weakness plus enlarged ABD girth as well as assist to move L LE off bed. General transfer comment: attempted sit to stand + 2 side by side assist and EVA at stand by however pt was unable to clear hips off bed enough.  "I just don't have enough confidence to got for it".  Used MAXI MOVE lift from seated EOB to seated recliner + 2 assist. Positioned in recliner then performed some TKR TE's mostly AAROM. Elevated L UE with pillows.    Recommendations for follow up therapy are one component of a multi-disciplinary discharge planning process, led by the attending physician.  Recommendations may be updated based on patient status, additional functional criteria and insurance authorization.  Follow Up Recommendations  Follow physician's recommendations for discharge plan and follow up therapies     Assistance Recommended at Discharge Frequent or constant Supervision/Assistance  Patient can return home with the following Two people to help with walking and/or transfers;Two people to help with bathing/dressing/bathroom;Assist for transportation;Help with stairs or ramp for entrance;Assistance with cooking/housework   Equipment Recommendations  None recommended by PT    Recommendations for Other Services       Precautions / Restrictions Precautions Precautions: Fall;Knee Precaution Comments: no pillow  under knee Restrictions Weight Bearing Restrictions: No LLE Weight Bearing: Weight bearing as tolerated     Mobility  Bed Mobility Overal bed mobility: Needs Assistance Bed Mobility: Supine to Sit     Supine to sit: Max assist     General bed mobility comments: pt required MAX Assist to transition to EOB due to weakness plus enlarged ABD girth as well as assist to move L LE off bed.    Transfers Overall transfer level: Needs assistance Equipment used: Rolling walker (2 wheels) Transfers: Sit to/from Stand Sit to Stand: +2 safety/equipment, +2 physical assistance           General transfer comment: attempted sit to stand + 2 side by side assist and EVA at stand by however pt was unable to clear hips off bed enough.  "I just don't have enough confidence to got for it".  Used MAXI MOVE lift from seated EOB to seated recliner + 2 assist.    Ambulation/Gait               General Gait Details: non amb at this time due to profound weakness   Stairs             Wheelchair Mobility    Modified Rankin (Stroke Patients Only)       Balance                                            Cognition Arousal/Alertness: Awake/alert Behavior During Therapy: Restless, Anxious Overall Cognitive  Status: Within Functional Limits for tasks assessed                                 General Comments: AxO x 3 very pleasant and cooperative.        Exercises   10 reps AP, knee presses, HS AAROM   General Comments        Pertinent Vitals/Pain Pain Assessment Pain Assessment: Faces Faces Pain Scale: Hurts little more Pain Location: L knee Pain Descriptors / Indicators: Grimacing, Guarding, Sore Pain Intervention(s): Monitored during session, Repositioned    Home Living                          Prior Function            PT Goals (current goals can now be found in the care plan section) Progress towards PT goals:  Progressing toward goals    Frequency    7X/week      PT Plan Current plan remains appropriate    Co-evaluation              AM-PAC PT "6 Clicks" Mobility   Outcome Measure  Help needed turning from your back to your side while in a flat bed without using bedrails?: A Lot Help needed moving from lying on your back to sitting on the side of a flat bed without using bedrails?: A Lot Help needed moving to and from a bed to a chair (including a wheelchair)?: A Lot Help needed standing up from a chair using your arms (e.g., wheelchair or bedside chair)?: Total Help needed to walk in hospital room?: Total Help needed climbing 3-5 steps with a railing? : Total 6 Click Score: 9    End of Session Equipment Utilized During Treatment: Gait belt Activity Tolerance: Patient limited by fatigue;Patient limited by pain Patient left: in chair;with chair alarm set;with call bell/phone within reach Nurse Communication: Mobility status PT Visit Diagnosis: Difficulty in walking, not elsewhere classified (R26.2);Unsteadiness on feet (R26.81);Pain Pain - Right/Left: Left Pain - part of body: Knee     Time: 1212-1250 PT Time Calculation (min) (ACUTE ONLY): 38 min  Charges:  $Therapeutic Exercise: 8-22 mins $Therapeutic Activity: 23-37 mins                     Rica Koyanagi  PTA Acute  Rehabilitation Services Office M-F          [phone] Weekend pager [phone]

## 2022-07-17 NOTE — Progress Notes (Signed)
Patient ID: Randy Combs, male   DOB: [birth year], 56 y.o.   MRN: [id number] I did arrive back in town this afternoon after being off for over a week.  I was informed of what had transpired with [name].  I greatly appreciate the hospitalist service taking such good care of this patient.  I came over this evening to see him and we talked in length about his situation.  His left operative knee remained stable.  He is quite weak from the experience that he has been through and he is tired but seems motivated to continue to improve from a health standpoint and from a recovery standpoint from his knee replacement.  Hopefully he will continue to improve and can increase his mobility working with therapy while he is still an inpatient.  We will continue to follow him along daily.

## 2022-07-17 NOTE — Progress Notes (Signed)
Subjective: 10 Days Post-Op Procedure(s) (LRB): LEFT TOTAL KNEE ARTHROPLASTY (Left) Patient reports pain as severe.   Extubated sitting on edge of bed with PT. Unable to stand on his own due to weakness.   Objective: Vital signs in last 24 hours: Temp:  [97.6 F (36.4 C)-100.5 F (38.1 C)] 97.8 F (36.6 C) (08/14 1036) Pulse Rate:  [71-94] 80 (08/14 1036) Resp:  [13-34] 17 (08/14 1036) BP: (131-190)/(75-115) 131/79 (08/14 1036) SpO2:  [83 %-100 %] 100 % (08/14 1036) Weight:  [211 kg] 138 kg (08/14 0500)  Intake/Output from previous day: 08/13 0701 - 08/14 0700 In: 923.4 [P.O.:240; I.V.:238.8; NG/GT:88.3; IV Piggyback:356.2] Out: 550 [Urine:450; Stool:100] Intake/Output this shift: Total I/O In: 200 [IV Piggyback:200] Out: -   Recent Labs    07/15/22 0251 07/16/22 0309 07/17/22 0255  HGB 9.9* 11.3* 11.2*   Recent Labs    07/16/22 0309 07/17/22 0255  WBC 14.6* 14.0*  RBC 3.30* 3.18*  HCT 33.6* 33.6*  PLT 388 332   Recent Labs    07/16/22 0309 07/17/22 0255  NA 141 136  K 3.3* 3.2*  CL 106 102  CO2 23 26  BUN 16 14  CREATININE 0.81 0.80  GLUCOSE 161* 151*  CALCIUM 8.9 8.3*   No results for input(s): "LABPT", "INR" in the last 72 hours. General:  Awake and alert. No acute distress.   Left lower extremity: Incision: dressing C/D/I Able to flex knee to approximately 85 degrees.   Assessment/Plan: 10 Days Post-Op Procedure(s) (LRB): LEFT TOTAL KNEE ARTHROPLASTY (Left)  Up with therapy to work on ambulation and range of motion left knee.      Randy Combs 07/17/2022, 12:25 PM

## 2022-07-17 NOTE — Progress Notes (Signed)
[facility] ADULT ICU REPLACEMENT PROTOCOL   The patient does apply for the [facility] Adult ICU Electrolyte Replacment Protocol based on the criteria listed below:   1.Exclusion criteria: TCTS patients, ECMO patients, and Dialysis patients 2. Is GFR >/= 30 ml/min? Yes.    Patient's GFR today is >60 3. Is SCr </= 2? Yes.   Patient's SCr is 0.80 mg/dL 4. Did SCr increase >/= 0.5 in 24 hours? No. 5.Pt's weight >40kg  Yes.   6. Abnormal electrolyte(s): K, Mag  7. Electrolytes replaced per protocol 8.  Call MD STAT for K+ </= 2.5, Phos </= 1, or Mag </= 1 Physician:  Joette Catching [facility] 07/17/2022 5:38 AM

## 2022-07-17 NOTE — Progress Notes (Signed)
NAME:  Randy Combs, MRN:  [id number], DOB:  [birth year], LOS: 6 ADMISSION DATE:  07/07/2022, CONSULTATION DATE:  07/10/22 REFERRING MD:  Margarite Gouge, CHIEF COMPLAINT:  ETOH w/d   History of Present Illness:  56 year old M admitted for elective knee surgery on 07/07/2022.  He underwent local, regional, spinal, and general anesthesia for his procedure.  He was admitted overnight for observation.  Unfortunately postoperatively he has had a complicated course with postop ileus and associated distention causing shortness of breath.  NG tube placed.  He has developed worsened agitation, tachycardia, restlessness, and tremors.  Although initially he indicated he drink few days per week, family indicated that he drinks more than what he had initially endorsed.  Due to concern for significant alcohol withdrawal and DTs he was transferred to the intensive care unit.  He is unable to receive oral benzodiazepines to help control symptoms due to his ileus.  C/o being thirsty and wanting to get out of restraints.  He has had significant agitation and was only intermittently redirectable. Progressed to respiratory failure requiring intubation.   Pertinent  Medical History  Alcohol abuse Obesity DM 2 Gout Hiatal hernia Hypertension  Significant Hospital Events: Including procedures, antibiotic start and stop dates in addition to other pertinent events   8/4 Admit for elective knee surgery 8/5 Abdominal distention, post op ileus 8/6 NG tube placement for gastric decompression 8/7 Transfer to ICU, initiated on Precedex and phenobarbital. Intubated for respiratory failure. 8/9 Remains on vent sedated with distended abdomen. AKI worse today.  8L+ 8/10 Trickle TF initiated.  BM early am.  8/11 extubated 8/12 cleviprex gtt started for HTN 8/14 No acute events overnight, remains off any continuous drips with continued BMs. BP remains elevated   Interim History / Subjective:  Alert and oriented this am,  reports some right knee pain   Objective   Blood pressure (!) 189/83, pulse 88, temperature 99.3 F (37.4 C), temperature source Oral, resp. rate (!) 23, height '5\' 10"'$  (1.778 m), weight (!) 138 kg, SpO2 97 %.        Intake/Output Summary (Last 24 hours) at 07/17/2022 0730 Last data filed at 07/16/2022 2000 Gross per 24 hour  Intake 895.56 ml  Output 0 ml  Net 895.56 ml    Filed Weights   07/15/22 0500 07/16/22 0500 07/17/22 0500  Weight: (!) 139.7 kg (!) 138.5 kg (!) 138 kg    Examination: General: Well appearing middle aged male lying in bed NAD HEENT: [place]/AT, MM pink/moist, PERRL,  Neuro: Alert and oriented x3, non-focal  CV: s1s2 regular rate and rhythm, no murmur, rubs, or gallops,  PULM:  Clear to ascultation, no increased work of breathing, on RA GI: soft, bowel sounds active in all 4 quadrants, non-tender, non-distended, tolerating oral diet  Extremities: warm/dry, no edema  Skin: no rashes or lesions  Resolved Hospital Problem list   AKI Acute Respiratory Failure with Hypoxia -Extubated 8/11 Possible Aspiration Pneumonia / Pneumonitis  -Completed 5 day course of ABT 8/12 Hyperbilirubinemia, likely due to Ileus vs Sepsis Acute Metabolic Encephalopathy in the setting of ETOH Withdrawal   Assessment & Plan:   Hypertensive urgency with Hx of HTN  -BP remains elevated this am 8/14. On chart review it appears that Norvasc was stopped by PCP April of this year as BP was well controlled.  P: Increased both Coreg and add Norvasc 8/14 Continue Clonidine but start taper, this was added for withdrawal management and would not be a good option for long  term hypertension management  PRN IV Labetalol for SBP greater than 180 Continuous telemetry   Postoperative Ileus -Prior to intubation NG tube was placed but this was dislodged, multiple attempt were made to replace 8/8 but all failed. Radiology to assisted in placement under floro 8/9.  History of large hiatal hernia that  was repaired 5 years ago this is likely contributing to difficult NG/OG placement.  S/p Relistor 8/8. TF trial initiated 8/10 and stopped 8/12 after dislodgment of NG.  P: Greatly appreciate GI assistance in management  Continue clear liquid diet, await GI reccs for advancement  Optimize electrolytes, K > 4, Mg > 2 Continue scheduled bowel regiment, flexi seal in place may need to back off regiment slightly  Mobilize as able   BRBPR  -Small amt, suspect trauma related to rectal suppositories and enemas P: Avoid any further enemas or suppositories  Remove flexi as able  Continue SubQ heparin   Osteoarthritis, s/p left TKA P: Primary management per Ortho  PT/OT evals  Encourage mobility  Schedule tylenol  Judicious use of narcotics given ileus   Acute Postop Anemia + component of IDA & Critical Illness  P: Trend CBC  Transfuse per protocol Hgb goal > 7  Hyperglycemia, Pre-DM -Hgb A1c 6.3 P: Continue SSI  CBG goal 140-180  LUE SVT of Cephalic Vein  P: Elevate LUE  Warm compresses    Best Practice (right click and "Reselect all SmartList Selections" daily)  Diet/type: clear liquids DVT prophylaxis: prophylactic heparin  GI prophylaxis: PPI Lines: N/A Foley:  Yes, and it is still needed - will evaluate for purewick today Code Status:  full code Last date of multidisciplinary goals of care discussion: Patient updated at bedside 8/14  Critical care time: NA  Nissi Doffing D. Kenton Kingfisher, NP-C  Pulmonary & Critical Care Personal contact information can be found on Amion  07/17/2022, 9:11 AM

## 2022-07-17 NOTE — Progress Notes (Signed)
Physical Therapy Treatment Patient Details Name: Randy Combs MRN: [id number] DOB: 1966/06/19 Today's Date: 07/17/2022   History of Present Illness 56 yo male S/P LTKA 07/07/22; 8/5 pt developed post op ileus with abdominal distention; 8/6 NG tube placed; 8/7 Pt to ICU, Precedex initiated, pt intubated 2* respiratory failure; 8/11 pt extubated PMH: gout, DM, glaucoma,  P    PT Comments    POD # 10 L TKR + ILEUS + VDRF (intubated 8/7 then extubated 8/11) Assisted back to bed via [place] then positioned to comfort.   Recommendations for follow up therapy are one component of a multi-disciplinary discharge planning process, led by the attending physician.  Recommendations may be updated based on patient status, additional functional criteria and insurance authorization.  Follow Up Recommendations  Follow physician's recommendations for discharge plan and follow up therapies     Assistance Recommended at Discharge Frequent or constant Supervision/Assistance  Patient can return home with the following Two people to help with walking and/or transfers;Two people to help with bathing/dressing/bathroom;Assist for transportation;Help with stairs or ramp for entrance;Assistance with cooking/housework   Equipment Recommendations  None recommended by PT    Recommendations for Other Services       Precautions / Restrictions Precautions Precautions: Fall;Knee Precaution Comments: no pillow under knee Restrictions Weight Bearing Restrictions: No LLE Weight Bearing: Weight bearing as tolerated                                                    Cognition Arousal/Alertness: Awake/alert Behavior During Therapy: Restless, Anxious Overall Cognitive Status: Within Functional Limits for tasks assessed                                 General Comments: AxO x 3 very pleasant and cooperative.        Exercises      General Comments         Pertinent Vitals/Pain Pain Assessment Pain Assessment: Faces Faces Pain Scale: Hurts little more Pain Location: L knee Pain Descriptors / Indicators: Grimacing, Guarding, Sore Pain Intervention(s): Monitored during session, Repositioned    Home Living                          Prior Function            PT Goals (current goals can now be found in the care plan section) Progress towards PT goals: Progressing toward goals    Frequency    7X/week      PT Plan Current plan remains appropriate    Co-evaluation              AM-PAC PT "6 Clicks" Mobility   Outcome Measure  Help needed turning from your back to your side while in a flat bed without using bedrails?: A Lot Help needed moving from lying on your back to sitting on the side of a flat bed without using bedrails?: A Lot Help needed moving to and from a bed to a chair (including a wheelchair)?: A Lot Help needed standing up from a chair using your arms (e.g., wheelchair or bedside chair)?: Total Help needed to walk in hospital room?: Total Help needed climbing 3-5 steps with a railing? : Total 6 Click Score:  9    End of Session Equipment Utilized During Treatment: Gait belt Activity Tolerance: Patient limited by fatigue;Patient limited by pain Patient left: in bed Nurse Communication: Mobility status PT Visit Diagnosis: Difficulty in walking, not elsewhere classified (R26.2);Unsteadiness on feet (R26.81);Pain Pain - Right/Left: Left Pain - part of body: Knee     Time: 1586-8257 PT Time Calculation (min) (ACUTE ONLY): 14 min  Charges:   $Therapeutic Activity: 8-22 mins                     {Adonias Demore  PTA Acute  Rehabilitation Services Office M-F          [phone] Weekend pager [phone]

## 2022-07-17 NOTE — Progress Notes (Addendum)
Patient ID: Randy Combs, male   DOB: Jun 03, 1966, 56 y.o.   MRN: [id number]    Progress Note   Subjective   Day # 11 CC; post op Ileus/ severe ETOH withdrawal requiring sedation/vent  Extubated 8/11  Labs-magnesium 1.6/potassium 3.2 Creatinine 0.8 AST 75/ALT 46 WBC 14.0/hemoglobin 11.2/hematocrit 33.6  Last KUB 07/15/2022  Patient says he is able to tolerate clear liquids, does not really have much of an appetite, no nausea or vomiting, he feels his abdomen is getting softer each day no significant complaints of pain Rectal tube in place with liquid stool    Objective   Vital signs in last 24 hours: Temp:  [97 F (36.1 C)-100.5 F (38.1 C)] 98.6 F (37 C) (08/14 0800) Pulse Rate:  [71-94] 88 (08/14 0600) Resp:  [13-34] 23 (08/14 0600) BP: (154-205)/(75-115) 189/83 (08/14 0600) SpO2:  [95 %-100 %] 97 % (08/14 0600) Weight:  [027 kg] 138 kg (08/14 0500) Last BM Date : 07/16/22 General: [name] in NAD Heart:  Regular rate and rhythm; no murmurs Lungs: Respirations even and unlabored, lungs CTA bilaterally Abdomen: Abdomen much softer than when last examined by myself 3 days ago bowel sounds are present. ,  Still somewhat distended no focal tenderness Extremities:  Without edema. Neurologic:  Alert and oriented,  grossly normal neurologically. Psych:  Cooperative. Normal mood and affect.  Intake/Output from previous day: 08/13 0701 - 08/14 0700 In: 923.4 [P.O.:240; I.V.:238.8; NG/GT:88.3; IV Piggyback:356.2] Out: 550 [Urine:450; Stool:100] Intake/Output this shift: No intake/output data recorded.  Lab Results: Recent Labs    07/15/22 0251 07/16/22 0309 07/17/22 0255  WBC 12.8* 14.6* 14.0*  HGB 9.9* 11.3* 11.2*  HCT 29.9* 33.6* 33.6*  PLT 356 388 332   BMET Recent Labs    07/15/22 0929 07/16/22 0309 07/17/22 0255  NA 144 141 136  K 2.9* 3.3* 3.2*  CL 114* 106 102  CO2 18* 23 26  GLUCOSE 148* 161* 151*  BUN '15 16 14  '$ CREATININE 0.86 0.81  0.80  CALCIUM 6.9* 8.9 8.3*   LFT Recent Labs    07/17/22 0255  PROT 6.6  ALBUMIN 2.5*  AST 75*  ALT 46*  ALKPHOS 100  BILITOT 1.1   PT/INR No results for input(s): "LABPROT", "INR" in the last 72 hours.  Studies/Results: DG Abd Portable 1V  Result Date: 07/15/2022 CLINICAL DATA:  Nausea and vomiting.  Abdominal distention. EXAM: PORTABLE ABDOMEN - 1 VIEW COMPARISON:  July 14, 2022 FINDINGS: Dilated loops of bowel again identified. There clearly dilated loops of small bowel. There appears to be air within at least the proximal colon. No gas in the rectum. No other abnormalities. IMPRESSION: Continued small bowel dilatation. There is also air in at least the proximal colon and a small amount of air in the distal descending colon. The findings likely represent ileus a as previously reported. Electronically Signed   By: Dorise Bullion III M.D.   On: 07/15/2022 11:21       Assessment / Plan:    #17 56 year old male status post knee replacement 07/07/2022 #2 postop ileus-large and small bowel which has been persistent over the past week-definitely making progress over the past few days since he has been able to be extubated and able to wean off narcotics  Having liquid stool/rectal tube in place Tolerating clears  #3 severe EtOH withdrawal requiring sedation and vent support-resolved #4 hypokalemia and hypomagnesemia-continue to replace to promote continued resolution of ileus  #5 adult onset diabetes mellitus #  6 anemia postop stable  Plan; abdominal film today-expect improvement, films look better can advance to full liquid diet For now will continue same bowel regimen    Principal Problem:   Unilateral primary osteoarthritis, left knee Active Problems:   Status post total left knee replacement   Acute respiratory failure with hypoxia (HCC)   Ileus, postoperative ([place])     LOS: 6 days   Randy Esterwood PA-C 07/17/2022, 8:33 AM   I have taken a history, reviewed the  chart and examined the patient. I performed a substantive portion of this encounter, including complete performance of at least one of the key components, in conjunction with the APP. I agree with the APP's note, impression and recommendations  Patient reports feeling much better, but still has significant abdominal distention, tympany and tinkling bowel sounds.  Would recommend continuing a clear liquid diet and consider advancing tomorrow if continuing to improve. Continue to encourage physical activity as tolerated, limit narcotics as much as possible, correct electrolytes Continue current bowel regimen for now, but can likely de-escalate tomorrow given his large output over the past 24 hours. Patient tells me he will never drink alcohol again.  Randy Ohlin E. Candis Schatz, MD [facility]

## 2022-07-17 NOTE — Progress Notes (Signed)
Orthopedic Tech Progress Note Patient Details:  Randy Combs 16-Dec-1965 [id number]  Patient ID: [name], male   DOB: 09-08-1966, 56 y.o.   MRN: [id number] Patient refused CPM, stated he does not want to do it today, but maybe tomorrow. Vernona Rieger 07/17/2022, 4:46 PM

## 2022-07-18 DIAGNOSIS — G9341 Metabolic encephalopathy: Secondary | ICD-10-CM

## 2022-07-18 DIAGNOSIS — K9189 Other postprocedural complications and disorders of digestive system: Secondary | ICD-10-CM | POA: Diagnosis not present

## 2022-07-18 DIAGNOSIS — K567 Ileus, unspecified: Secondary | ICD-10-CM | POA: Diagnosis not present

## 2022-07-18 DIAGNOSIS — I160 Hypertensive urgency: Secondary | ICD-10-CM

## 2022-07-18 DIAGNOSIS — F10939 Alcohol use, unspecified with withdrawal, unspecified: Secondary | ICD-10-CM

## 2022-07-18 LAB — CBC
HCT: 32.7 % — ABNORMAL LOW (ref 39.0–52.0)
Hemoglobin: 10.7 g/dL — ABNORMAL LOW (ref 13.0–17.0)
MCH: 34.6 pg — ABNORMAL HIGH (ref 26.0–34.0)
MCHC: 32.7 g/dL (ref 30.0–36.0)
MCV: 105.8 fL — ABNORMAL HIGH (ref 80.0–100.0)
Platelets: 366 10*3/uL (ref 150–400)
RBC: 3.09 MIL/uL — ABNORMAL LOW (ref 4.22–5.81)
RDW: 15.5 % (ref 11.5–15.5)
WBC: 10.7 10*3/uL — ABNORMAL HIGH (ref 4.0–10.5)
nRBC: 0 % (ref 0.0–0.2)

## 2022-07-18 LAB — BASIC METABOLIC PANEL
Anion gap: 10 (ref 5–15)
BUN: 14 mg/dL (ref 6–20)
CO2: 24 mmol/L (ref 22–32)
Calcium: 8.7 mg/dL — ABNORMAL LOW (ref 8.9–10.3)
Chloride: 103 mmol/L (ref 98–111)
Creatinine, Ser: 0.95 mg/dL (ref 0.61–1.24)
GFR, Estimated: >60 mL/min (ref >60)
Glucose, Bld: 141 mg/dL — ABNORMAL HIGH (ref 70–99)
Potassium: 3.9 mmol/L (ref 3.5–5.1)
Sodium: 137 mmol/L (ref 135–145)

## 2022-07-18 LAB — GLUCOSE, CAPILLARY
Glucose-Capillary: 133 mg/dL — ABNORMAL HIGH (ref 70–99)
Glucose-Capillary: 116 mg/dL — ABNORMAL HIGH (ref 70–99)
Glucose-Capillary: 165 mg/dL — ABNORMAL HIGH (ref 70–99)
Glucose-Capillary: 168 mg/dL — ABNORMAL HIGH (ref 70–99)

## 2022-07-18 LAB — MAGNESIUM: Magnesium: 2.1 mg/dL (ref 1.7–2.4)

## 2022-07-18 LAB — PHOSPHORUS: Phosphorus: 2.8 mg/dL (ref 2.5–4.6)

## 2022-07-18 MED ORDER — POLYETHYLENE GLYCOL 3350 17 G PO PACK
17.0000 g | PACK | Freq: Two times a day (BID) | ORAL | Status: DC
  Administered 2022-07-18 – 2022-07-19 (×3): 17 g via ORAL
  Filled 2022-07-18 (×4): qty 1

## 2022-07-18 MED ORDER — ENSURE ENLIVE PO LIQD
237.0000 mL | Freq: Two times a day (BID) | ORAL | Status: DC
  Administered 2022-07-18 – 2022-07-19 (×3): 237 mL via ORAL

## 2022-07-18 MED ORDER — ADULT MULTIVITAMIN W/MINERALS CH
1.0000 | ORAL_TABLET | Freq: Every day | ORAL | Status: DC
  Administered 2022-07-18 – 2022-07-20 (×3): 1 via ORAL
  Filled 2022-07-18 (×3): qty 1

## 2022-07-18 MED ORDER — BOOST / RESOURCE BREEZE PO LIQD CUSTOM
1.0000 | ORAL | Status: DC
  Administered 2022-07-19: 1 via ORAL

## 2022-07-18 NOTE — Progress Notes (Addendum)
Physical Therapy Treatment Patient Details Name: Randy Combs MRN: [id number] DOB: [birth year] Today's Date: 07/18/2022   History of Present Illness 56 yo male S/P LTKA 07/07/22; 8/5 pt developed post op ileus with abdominal distention; 8/6 NG tube placed; 8/7 Pt to ICU, Precedex initiated, pt intubated 2* respiratory failure; 8/11 pt extubated PMH: gout, DM, glaucoma.    PT Comments    Max assist for supine to sit. Pt reported dizziness sitting at edge of bed where BP was 177/122 (RN notified). Pt was able to sit at edge of bed ~15 minutes. Assisted pt with L TKA exercises. Maximove used to transfer to recliner. Pt puts forth good effort but was quite fatigued after sitting 15 minutes.  PT now recommending ST-SNF.  Recommendations for follow up therapy are one component of a multi-disciplinary discharge planning process, led by the attending physician.  Recommendations may be updated based on patient status, additional functional criteria and insurance authorization.  Follow Up Recommendations  Acute inpatient rehab Can patient physically be transported by private vehicle: No   Assistance Recommended at Discharge Frequent or constant Supervision/Assistance  Patient can return home with the following Two people to help with walking and/or transfers;Two people to help with bathing/dressing/bathroom;Assist for transportation;Help with stairs or ramp for entrance;Assistance with cooking/housework   Equipment Recommendations  None recommended by PT    Recommendations for Other Services       Precautions / Restrictions Precautions Precautions: Fall;Knee Precaution Comments: no pillow under knee Required Braces or Orthoses: Knee Immobilizer - Left Knee Immobilizer - Right: Discontinue once straight leg raise with < 10 degree lag Knee Immobilizer - Left: Discontinue once straight leg raise with < 10 degree lag Restrictions Weight Bearing Restrictions: No LLE Weight Bearing: Weight  bearing as tolerated Other Position/Activity Restrictions: WBAT     Mobility  Bed Mobility   Bed Mobility: Supine to Sit     Supine to sit: Max assist, HOB elevated     General bed mobility comments: pt required MAX Assist to transition to EOB due to weakness plus enlarged abdominal girth as well as assist to move L LE off bed.    Transfers     Transfers: Bed to chair/wheelchair/BSC             General transfer comment: deferred 2* elevated BP, fatigue and dizziness in sitting (BP 177/122 sitting EOB -RN notified); used maximove for bed to Licensed conveyancer: Maximove  Ambulation/Gait               General Gait Details: non amb at this time due to profound weakness   Stairs             Wheelchair Mobility    Modified Rankin (Stroke Patients Only)       Balance Overall balance assessment: Needs assistance Sitting-balance support: Feet supported, Single extremity supported Sitting balance-Leahy Scale: Good Sitting balance - Comments: dizzy sitting EOB, BP 177/122                                    Cognition Arousal/Alertness: Awake/alert Behavior During Therapy: WFL for tasks assessed/performed, Anxious Overall Cognitive Status: Within Functional Limits for tasks assessed                                 General Comments: AxO x 3 very pleasant  and cooperative.        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, chickens) Home Access: Stairs to enter Entrance Stairs-Rails: Left Entrance Stairs-Number of Steps: 2 Alternate Level Stairs-Number of Steps: slit with 1 step to BR-says he can hold onto things Home Layout: One level Home Equipment: Conservation officer, nature (2 wheels);Crutches;Grab bars - tub/shower;Toilet riser;Grab bars - toilet;Hand held shower head;Shower seat      Prior Function            PT Goals (current goals can now be found in the care plan section) Acute Rehab PT Goals Patient Stated Goal: take care of his cows and goats PT Goal Formulation: With patient Time For Goal Achievement: 07/14/22 Potential to Achieve Goals: Good Progress towards PT goals: Progressing toward goals    Frequency    7X/week      PT Plan Current plan remains appropriate    Co-evaluation PT/OT/SLP Co-Evaluation/Treatment: Yes Reason for Co-Treatment: Complexity of the patient's impairments (multi-system involvement) PT goals addressed during session: Mobility/safety with mobility;Balance;Strengthening/ROM OT goals addressed during session: ADL's and self-care      AM-PAC PT "6 Clicks" Mobility   Outcome Measure  Help needed turning from your back to your side while in a flat bed without using bedrails?: A Lot Help needed moving from lying on your back to sitting on the side of a flat bed without using bedrails?: A Lot Help needed moving to and from a bed to a chair (including a wheelchair)?: Total Help needed standing up from a chair using your arms (e.g., wheelchair or bedside chair)?: Total Help needed to walk in hospital room?: Total Help needed climbing 3-5 steps with a railing? : Total 6 Click Score: 8    End of Session Equipment Utilized During Treatment: Gait belt Activity Tolerance: Patient limited by fatigue;Patient limited by pain Patient left: in chair;with chair alarm set;with call bell/phone  within reach Nurse Communication: Mobility status PT Visit Diagnosis: Difficulty in walking, not elsewhere classified (R26.2);Unsteadiness on feet (R26.81);Pain Pain - Right/Left: Left Pain - part of body: Knee     Time: 7615-1834 PT Time Calculation (min) (ACUTE ONLY): 44 min  Charges:  $Therapeutic Exercise: 8-22 mins $Therapeutic Activity: 8-22 mins                    Blondell Reveal Kistler PT 07/18/2022  Acute Rehabilitation Services  Office [phone]

## 2022-07-18 NOTE — Progress Notes (Signed)
Patient ID: DEMITRIOUS MCCANNON, male   DOB: 1966-06-23, 56 y.o.   MRN: [id number] I came by again to check on Mr. Luu.  Cognitively he looks better today.  He does report swelling in his right knee and his left hand.  I did examine his right knee.  There is global swelling of his right lower extremity but there is no redness.  He does have painful range of motion of the right knee and I did clean the knee with alcohol swabs and place a 18-gauge needle down to the knee joint did not get any fluid.  I think more of the edema is third spacing and dependent edema.  Again, there is no effusion for me to aspirate.  His left operative knee continues to be stable fortunately.  Attempt should be still made to mobilize the patient getting him up with therapy for mobility and hopeful strengthening.  Hopefully he will improve physically as time progresses.  I will continue to follow him while he is here.

## 2022-07-18 NOTE — Progress Notes (Signed)
Inpatient Rehab Admissions Coordinator:   Per updated therapy recommendations pt was screened for CIR by Shann Medal, PT, DPT.  At this time, pt not yet demonstrating ability to tolerate 3 hrs/day of therapy, but has excellent potential to progress to that point.  I will follow for 1-2 more therapy sessions and rescreen at that time.  If pt medically stable to d/c before that time may require alternative rehab venues be pursued.    Shann Medal, PT, DPT Admissions Coordinator [phone] 07/18/22  1:13 PM

## 2022-07-18 NOTE — Evaluation (Signed)
Occupational Therapy Evaluation Patient Details Name: Randy Combs MRN: [id number] DOB: Apr 20, 1966 Today's Date: 07/18/2022   History of Present Illness 56 yo male S/P LTKA 07/07/22; 8/5 pt developed post op ileus with abdominal distention; 8/6 NG tube placed; 8/7 Pt to ICU, Precedex initiated, pt intubated 2* respiratory failure; 8/11 pt extubated PMH: gout, DM, glaucoma.   Clinical Impression   Patient is currently requiring assistance with ADLs including Total assist with bed level Lower body ADLs, Minimal assist with seated Upper body ADLs,  as well as  Maximum assist with bed mobility and inability to stand to work on functional transfers to toilet, with need of MAXI MOVE to recliner.  Current level of function is below patient's typical baseline.  During this evaluation, patient was limited by body stiffness, generalized weakness, impaired activity tolerance, and pain to LT knee as well as to LT hand and forearm which pt thinks may be a gout flair and gout meds initiated today per pt and Nurse as well as dizziness, all of which has the potential to impact patient's safety and independence during functional mobility, as well as performance for ADLs.  Patient [place] home [place]  [place]arm, with his wife who is able to provide 24/7 supervision and assistance.  Pt however, currently requires more assistance that his wife could safely provide at home at this time. Patient demonstrates good rehab potential, and should benefit from continued skilled occupational therapy services while in acute care to maximize safety, independence and quality of life at home.  Continued occupational therapy services in a AIR setting prior to return home is recommended.  ?       Recommendations for follow up therapy are one component of a multi-disciplinary discharge planning process, led by the attending physician.  Recommendations may be updated based on patient status, additional functional criteria and  insurance authorization.   Follow Up Recommendations  Acute inpatient rehab (3hours/day)    Assistance Recommended at Discharge  (TBD based on progress. Wife can provide 24/7)  Patient can return home with the following A lot of help with bathing/dressing/bathroom;Assist for transportation;Assistance with cooking/housework;Two people to help with walking and/or transfers;Two people to help with bathing/dressing/bathroom;A lot of help with walking and/or transfers    Functional Status Assessment  Patient has had a recent decline in their functional status and demonstrates the ability to make significant improvements in function in a reasonable and predictable amount of time.  Equipment Recommendations   (TBD)    Recommendations for Other Services Rehab consult     Precautions / Restrictions Precautions Precautions: Fall;Knee Precaution Comments: no pillow under knee Required Braces or Orthoses: Knee Immobilizer - Left Knee Immobilizer - Right: Discontinue once straight leg raise with < 10 degree lag Knee Immobilizer - Left: Discontinue once straight leg raise with < 10 degree lag Restrictions Weight Bearing Restrictions: No LLE Weight Bearing: Weight bearing as tolerated Other Position/Activity Restrictions: WBAT      Mobility Bed Mobility   Bed Mobility: Supine to Sit     Supine to sit: Max assist, HOB elevated          Transfers                          Balance Overall balance assessment: Needs assistance Sitting-balance support: Feet supported, Single extremity supported Sitting balance-Leahy Scale: Good  ADL either performed or assessed with clinical judgement   ADL Overall ADL's : Needs assistance/impaired Eating/Feeding: Set up Eating/Feeding Details (indicate cue type and reason): May need assist with containers due to impiared LT hand use. Grooming: Dance movement psychotherapist;Set up;Sitting   Upper Body  Bathing: Set up;Sitting   Lower Body Bathing: Maximal assistance;+2 for physical assistance;Bed level   Upper Body Dressing : Minimal assistance;Sitting;Cueing for sequencing Upper Body Dressing Details (indicate cue type and reason): Cued to dress LT UE first for pain management Lower Body Dressing: Total assistance;+2 for physical assistance;Bed level;Sitting/lateral leans Lower Body Dressing Details (indicate cue type and reason): Standing deferred today due to elevated BP. Toilet Transfer: Total assistance;+2 for physical assistance;+2 for safety/equipment Toilet Transfer Details (indicate cue type and reason): Use of MAXI MOVE from EOB to Recliner Toileting- Clothing Manipulation and Hygiene: Total assistance Toileting - Clothing Manipulation Details (indicate cue type and reason): Flexiseal and external catheter. Pt aware of when voiding bowel.     Functional mobility during ADLs: +2 for safety/equipment;+2 for physical assistance;Total assistance;Maximal assistance       Vision Baseline Vision/History: 0 No visual deficits Vision Assessment?: No apparent visual deficits     Perception     Praxis      Pertinent Vitals/Pain Pain Assessment Pain Assessment: 0-10 Pain Score: 4  Pain Location: L knee Pain Intervention(s): Premedicated before session, Monitored during session, Limited activity within patient's tolerance     Hand Dominance Right   Extremity/Trunk Assessment Upper Extremity Assessment Upper Extremity Assessment: LUE deficits/detail LUE Deficits / Details: Edema to hand/forearm. Painful to touch. Pt reports it could be gout and began gout medicine today.Unable to make composite grip. LUE: Unable to fully assess due to pain LUE Sensation: decreased light touch LUE Coordination: decreased fine motor   Lower Extremity Assessment Lower Extremity Assessment: Defer to PT evaluation   Cervical / Trunk Assessment Cervical / Trunk Assessment: Normal    Communication Communication Communication: No difficulties   Cognition Arousal/Alertness: Awake/alert Behavior During Therapy: WFL for tasks assessed/performed, Anxious Overall Cognitive Status: Within Functional Limits for tasks assessed                                 General Comments: AxO x 3 very pleasant and cooperative.     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, chickens) Home Access: Stairs to enter [employer] of Steps: 2 Entrance Stairs-Rails: Left Home Layout: One level Alternate Level Stairs-Number of Steps: slit with 1 step to [employer] he can hold onto things   Bathroom Shower/Tub: Occupational psychologist: Handicapped height ([place])     Home Equipment: Conservation officer, nature (2 wheels);Crutches;Grab bars - tub/shower;Toilet riser;Grab bars - toilet;Hand held shower head;Shower seat          Prior Functioning/Environment Prior Level of Function : Independent/Modified Independent;Driving;Working/employed             Mobility Comments: Independent ADLs Comments: Development worker, community for [employer]        OT Problem List: Pain;Decreased range of motion;Decreased strength;Decreased coordination;Cardiopulmonary status limiting activity;Impaired sensation;Increased edema;Decreased activity tolerance;Impaired balance (sitting and/or standing);Decreased knowledge of use of DME or AE;Obesity;Impaired  UE functional use      OT Treatment/Interventions: Self-care/ADL training;Therapeutic activities;Therapeutic exercise;DME and/or AE instruction;Patient/family education;Balance training;Manual therapy    OT Goals(Current goals can be found in the care plan section) Acute Rehab  OT Goals Patient Stated Goal: Get back to his [place] acre farm, his wife, and his job. OT Goal Formulation: With patient Time For Goal Achievement: 08/01/22 Potential to Achieve Goals: Good ADL Goals Pt Will Perform Lower Body Bathing: sitting/lateral leans;with adaptive equipment;bed level;with min assist Pt Will Perform Lower Body Dressing: with adaptive equipment;bed level;sitting/lateral leans;with min assist Pt Will Transfer to Toilet: with mod assist;stand pivot transfer Pt Will Perform Toileting - Clothing Manipulation and hygiene: with adaptive equipment;sitting/lateral leans;with min assist Pt/caregiver will Perform Home Exercise Program: Increased ROM;Increased strength;Left upper extremity (Including edema management) Additional ADL Goal #1: Pt will engage in at least 20 min functional activities at EOB without loss of sitting balance, in order to demonstrate intact activity tolerance and sitting balance needed to perform ADLs safely at home.  OT Frequency: Min 2X/week    Co-evaluation PT/OT/SLP Co-Evaluation/Treatment: Yes Reason for Co-Treatment: Complexity of the patient's impairments (multi-system involvement);For patient/therapist safety;To address functional/ADL transfers PT goals addressed during session: Mobility/safety with mobility;Strengthening/ROM OT goals addressed during session: ADL's and self-care      AM-PAC OT "6 Clicks" Daily Activity     Outcome Measure Help from another person eating meals?: A Little Help from another person taking care of personal grooming?: A Little Help from another person toileting, which includes using toliet, bedpan, or urinal?: Total Help from another person bathing (including washing, rinsing, drying)?: A Lot Help from another person to put on and taking off regular upper body clothing?: A Little Help from another person to put on and taking off regular lower body clothing?: Total 6 Click Score: 13   End of Session Equipment  Utilized During Treatment: Gait belt (MAXI MOVE) Nurse Communication: Mobility status;Other (comment) (Blood pressure and LT UE)  Activity Tolerance: Patient limited by pain;Patient limited by fatigue;Treatment limited secondary to medical complications (Comment) (HTN) Patient left: in chair;with call bell/phone within reach;with nursing/sitter in room  OT Visit Diagnosis: Pain;Unsteadiness on feet (R26.81);Muscle weakness (generalized) (M62.81) Pain - Right/Left: Left Pain - part of body: Hand;Knee                Time: 0981-1914 OT Time Calculation (min): 37 min Charges:  OT General Charges $OT Visit: 1 Visit OT Evaluation $OT Eval Moderate Complexity: 1 Mod  Harlei Lehrmann, OT Acute Rehab Services Office: [phone] 07/18/2022 Julien Girt 07/18/2022, 11:05 AM

## 2022-07-18 NOTE — Progress Notes (Signed)
PROGRESS NOTE    Randy Combs  ZSW:[id number] DOB: 29-Sep-1966 DOA: 07/07/2022 PCP: Owens Loffler, MD     Brief Narrative:  Randy Combs is a 56 year old male admitted for elective knee surgery on 07/07/2022.  He underwent local, regional, spinal, and general anesthesia for his procedure.  He was admitted overnight for observation.  Unfortunately postoperatively he has had a complicated course with postop ileus and associated distention causing shortness of breath.  NG tube placed.  He has developed worsened agitation, tachycardia, restlessness, and tremors.  Although initially he indicated he drink few days per week, family indicated that he drinks more than what he had initially endorsed.  Due to concern for significant alcohol withdrawal and DTs he was transferred to the intensive care unit.  He is unable to receive oral benzodiazepines to help control symptoms due to his ileus.  He has had significant agitation and was only intermittently redirectable. Progressed to respiratory failure requiring intubation.   8/4 Admit for elective knee surgery 8/5 Abdominal distention, post op ileus 8/6 NG tube placement for gastric decompression 8/7 Transfer to ICU, initiated on Precedex and phenobarbital. Intubated for respiratory failure. 8/9 Remains on vent sedated with distended abdomen. AKI worse today.  8L+ 8/10 Trickle TF initiated.  BM early am.  8/11 Extubated 8/12 Cleviprex gtt started for HTN 8/15 Transferred to [facility] [place] events last 24 hours / Subjective: Patient states that he is feeling well today.  Has ice around his left knee.  He is very motivated to increase his mobility.  Assessment & Plan:  Principal Problem:   Ileus, postoperative ([place]) Active Problems:   Essential hypertension   Uncontrolled type 2 diabetes mellitus with hyperglycemia, without long-term current use of insulin (HCC)   Unilateral primary osteoarthritis, left knee   Status post total left knee  replacement   Acute respiratory failure with hypoxia (HCC)   Hypertensive urgency   Acute metabolic encephalopathy   Alcohol withdrawal ([place])  Postop ileus -GI following -Slowly improving.  Advancing diet to full liquid today  Hypertensive urgency with history of hypertension -Off Cleviprex -Coreg, Norvasc, clonidine (this was added for withdrawal management)  Acute metabolic encephalopathy, alcohol withdrawal -Resolved  Status post left TKA -Per orthopedic surgery -PT OT   DVT prophylaxis:  heparin injection 5,000 Units Start: 07/14/22 0945 Place and maintain sequential compression device Start: 07/14/22 0539 SCDs Start: 07/07/22 1211  Code Status: Full Family Communication: None at bedside  Disposition Plan:  Status is: Inpatient Remains inpatient appropriate because: advancing diet   Antimicrobials:  Anti-infectives (From admission, onward)    Start     Dose/Rate Route Frequency Ordered Stop   07/13/22 1800  Ampicillin-Sulbactam (UNASYN) 3 g in sodium chloride 0.9 % 100 mL IVPB        3 g 200 mL/hr over 30 Minutes Intravenous Every 6 hours 07/13/22 1259 07/15/22 1837   07/12/22 1000  vancomycin (VANCOREADY) IVPB 1250 mg/250 mL  Status:  Discontinued        1,250 mg 166.7 mL/hr over 90 Minutes Intravenous Every 24 hours 07/11/22 0833 07/12/22 0728   07/12/22 0830  piperacillin-tazobactam (ZOSYN) IVPB 3.375 g  Status:  Discontinued        3.375 g 12.5 mL/hr over 240 Minutes Intravenous Every 8 hours 07/12/22 0753 07/13/22 1028   07/11/22 0900  vancomycin (VANCOREADY) IVPB 2000 mg/400 mL        2,000 mg 200 mL/hr over 120 Minutes Intravenous  Once 07/11/22 0743 07/11/22 1153  07/11/22 0830  ceFEPIme (MAXIPIME) 2 g in sodium chloride 0.9 % 100 mL IVPB  Status:  Discontinued        2 g 200 mL/hr over 30 Minutes Intravenous Every 8 hours 07/11/22 0743 07/12/22 0728   07/07/22 1430  ceFAZolin (ANCEF) IVPB 2g/100 mL premix        2 g 200 mL/hr over 30 Minutes  Intravenous Every 6 hours 07/07/22 1210 07/07/22 2031   07/07/22 0630  ceFAZolin (ANCEF) IVPB 3g/100 mL premix        3 g 200 mL/hr over 30 Minutes Intravenous On call to O.R. 07/07/22 0621 07/07/22 0835        Objective: Vitals:   07/17/22 2116 07/18/22 0603 07/18/22 1054 07/18/22 1142  BP: 111/65 125/69 (!) 172/122 106/66  Pulse: 83 92  79  Resp: 17 17    Temp: 98.5 F (36.9 C) 98.7 F (37.1 C)    TempSrc: Oral     SpO2: 99% 92% 97%   Weight:      Height:        Intake/Output Summary (Last 24 hours) at 07/18/2022 1237 Last data filed at 07/18/2022 1100 Gross per 24 hour  Intake 850 ml  Output 1150 ml  Net -300 ml   Filed Weights   07/15/22 0500 07/16/22 0500 07/17/22 0500  Weight: (!) 139.7 kg (!) 138.5 kg (!) 138 kg    Examination:  General exam: Appears calm and comfortable  Respiratory system: Clear to auscultation. Respiratory effort normal. No respiratory distress. No conversational dyspnea.  Cardiovascular system: S1 & S2 heard, RRR. No murmurs. No pedal edema. Gastrointestinal system: Abdomen is distended but not tender to palpation.  Flexi-Seal in place Central nervous system: Alert and oriented. No focal neurological deficits. Speech clear.  Extremities: Symmetric in appearance  Skin: No rashes, lesions or ulcers on exposed skin  Psychiatry: Judgement and insight appear normal. Mood & affect appropriate.   Data Reviewed: I have personally reviewed following labs and imaging studies  CBC: Recent Labs  Lab 07/14/22 0718 07/15/22 0251 07/16/22 0309 07/17/22 0255 07/18/22 0331  WBC 8.6 12.8* 14.6* 14.0* 10.7*  HGB 8.3* 9.9* 11.3* 11.2* 10.7*  HCT 24.6* 29.9* 33.6* 33.6* 32.7*  MCV 103.8* 104.9* 101.8* 105.7* 105.8*  PLT 295 356 388 332 767   Basic Metabolic Panel: Recent Labs  Lab 07/13/22 0612 07/14/22 0718 07/15/22 0251 07/15/22 0651 07/15/22 0929 07/16/22 0309 07/17/22 0255 07/18/22 0331  NA 133* 137 141 116* 144 141 136 137  K 3.9 3.6  3.4* 2.6* 2.9* 3.3* 3.2* 3.9  CL 102 107 107 86* 114* 106 102 103  CO2 19* 20* 20* 17* 18* '23 26 24  '$ GLUCOSE 113* 127* 141* 128* 148* 161* 151* 141*  BUN 47* 25* '17 14 15 16 14 14  '$ CREATININE 1.78* 1.07 1.04 0.80 0.86 0.81 0.80 0.95  CALCIUM 8.1* 8.4* 8.7* 7.0* 6.9* 8.9 8.3* 8.7*  MG 2.6* 2.1 1.8  --   --  2.1 1.6* 2.1  PHOS 4.0 1.6* 1.9* 1.5*  --   --   --  2.8   GFR: Estimated Creatinine Clearance: 123 mL/min (by C-G formula based on SCr of 0.95 mg/dL). Liver Function Tests: Recent Labs  Lab 07/14/22 0718 07/16/22 0309 07/17/22 0255  AST 33 49* 75*  ALT 21 31 46*  ALKPHOS 82 93 100  BILITOT 1.1 1.0 1.1  PROT 6.3* 6.9 6.6  ALBUMIN 2.5* 2.9* 2.5*   No results for input(s): "LIPASE", "AMYLASE" in the  last 168 hours. Recent Labs  Lab 07/15/22 0929  AMMONIA 15   Coagulation Profile: No results for input(s): "INR", "PROTIME" in the last 168 hours. Cardiac Enzymes: No results for input(s): "CKTOTAL", "CKMB", "CKMBINDEX", "TROPONINI" in the last 168 hours. BNP (last 3 results) No results for input(s): "PROBNP" in the last 8760 hours. HbA1C: No results for input(s): "HGBA1C" in the last 72 hours. CBG: Recent Labs  Lab 07/17/22 1202 07/17/22 1701 07/17/22 2117 07/18/22 0746 07/18/22 1139  GLUCAP 143* 166* 99 133* 116*   Lipid Profile: No results for input(s): "CHOL", "HDL", "LDLCALC", "TRIG", "CHOLHDL", "LDLDIRECT" in the last 72 hours. Thyroid Function Tests: No results for input(s): "TSH", "T4TOTAL", "FREET4", "T3FREE", "THYROIDAB" in the last 72 hours. Anemia Panel: No results for input(s): "VITAMINB12", "FOLATE", "FERRITIN", "TIBC", "IRON", "RETICCTPCT" in the last 72 hours. Sepsis Labs: Recent Labs  Lab 07/11/22 1844  LATICACIDVEN 1.6    Recent Results (from the past 240 hour(s))  MRSA Next Gen by PCR, Nasal     Status: None   Collection Time: 07/10/22  6:02 PM   Specimen: Nasal Mucosa; Nasal Swab  Result Value Ref Range Status   MRSA by PCR Next Gen  NOT DETECTED NOT DETECTED Final    Comment: (NOTE) The GeneXpert MRSA Assay (FDA approved for NASAL specimens only), is one component of a comprehensive MRSA colonization surveillance program. It is not intended to diagnose MRSA infection nor to guide or monitor treatment for MRSA infections. Test performance is not FDA approved in patients less than 79 years old. Performed at [facility], [place] [address] [place], [place] [address]   Culture, blood (Routine X 2) w Reflex to ID Panel     Status: None   Collection Time: 07/11/22  7:50 AM   Specimen: BLOOD  Result Value Ref Range Status   Specimen Description   Final    BLOOD BLOOD RIGHT FOREARM Performed at [facility] [address], [place], [place] [address]    Special Requests   Final    IN PEDIATRIC BOTTLE Blood Culture adequate volume Performed at [facility] [address], [place], [place] [address]    Culture   Final    NO GROWTH 5 DAYS Performed at [facility], [place] [address], [place], [place] [address]    Report Status 07/16/2022 FINAL  Final  Culture, Respiratory w Gram Stain     Status: None   Collection Time: 07/11/22  7:55 AM   Specimen: Tracheal Aspirate; Respiratory  Result Value Ref Range Status   Specimen Description   Final    TRACHEAL ASPIRATE Performed at [facility] [address], [place], [place] [address]    Special Requests   Final    NONE Performed at [facility], [place] [address] [place], [place] [address]    Gram Stain   Final    FEW WBC PRESENT, PREDOMINANTLY MONONUCLEAR FEW GRAM POSITIVE COCCI IN CLUSTERS MODERATE GRAM POSITIVE COCCI IN PAIRS AND CHAINS FEW GRAM NEGATIVE RODS    Culture   Final    MODERATE Normal respiratory flora-no Staph aureus or Pseudomonas seen Performed at [facility], [place] [address], [place], [place] [address]    Report Status 07/13/2022 FINAL  Final   Culture, blood (Routine X 2) w Reflex to ID Panel     Status: None   Collection Time: 07/11/22  6:44 PM   Specimen: BLOOD  Result Value Ref Range  Status   Specimen Description   Final    BLOOD BLOOD RIGHT FOREARM Performed at [facility] [address], [place], [place] [address]    Special Requests   Final    IN PEDIATRIC BOTTLE Blood Culture adequate volume Performed at [facility] [address] [place], [place] [address]    Culture   Final    NO GROWTH 5 DAYS Performed at [facility], [place] [address], [place], [place] [address]    Report Status 07/16/2022 FINAL  Final      Radiology Studies: DG Abd 2 Views  Result Date: 07/17/2022 CLINICAL DATA:  Ileus EXAM: ABDOMEN - 2 VIEW COMPARISON:  Radiograph 07/15/2022 FINDINGS: Diffuse gaseous distension of bowel, with some mild bowel wall thickening. Increased gaseous distention of the colon, particularly the ascending and proximal transverse colon. No acute osseous abnormality. IMPRESSION: Persistent diffuse gaseous distension of bowel, compatible with ileus, with increased distention of the colon in comparison to prior. Electronically Signed   By: Maurine Simmering M.D.   On: 07/17/2022 11:26      Scheduled Meds:  acetaminophen  650 mg Oral Q6H   amLODipine  10 mg Oral Daily   carvedilol  25 mg Oral BID WC   Chlorhexidine Gluconate Cloth  6 each Topical Daily   cloNIDine  0.1 mg Oral BID   Followed by   Derrill Memo ON 07/19/2022] cloNIDine  0.1 mg Oral Daily   colchicine  0.6 mg Oral BID   heparin injection (subcutaneous)  5,000 Units Subcutaneous Q8H   insulin aspart  0-15 Units Subcutaneous TID WC   metoCLOPramide (REGLAN) injection  10 mg Intravenous Q8H   pantoprazole  40 mg Oral Daily   polyethylene glycol  17 g Oral BID   senna  1 tablet Oral Daily   thiamine (VITAMIN B1) injection  100 mg Intravenous Daily   Continuous Infusions:  sodium chloride Stopped (07/11/22 0843)      LOS: 7 days     Dessa Phi, DO [facility] 07/18/2022, 12:37 PM   Available via Epic secure chat 7am-7pm After these hours, please refer to coverage provider listed on [email or web address]

## 2022-07-18 NOTE — Progress Notes (Addendum)
Patient ID: Randy Combs, male   DOB: 02/19/1966, 56 y.o.   MRN: [id number]    Progress Note   Subjective   Day# 12  CC;Ileus post OP /ETOH withdrawal requiring sedation/vent- resolved  WBC 10.7/hemoglobin 10.7/mHCT 32.7 Magnesium 2.1/K 3.9  Sitting on side of bed with PT, feeling dizzy currently, was able to get up to the chair yesterday-says he feels a bit weak, did not get his breakfast this morning,  Complaints of abdominal pain, abdomen feels a bit bloated but is not bothering him much Rectal tube with liquid stool   Objective   Vital signs in last 24 hours: Temp:  [97.8 F (36.6 C)-98.9 F (37.2 C)] 98.7 F (37.1 C) (08/15 0603) Pulse Rate:  [80-94] 92 (08/15 0603) Resp:  [17-22] 17 (08/15 0603) BP: (111-174)/(62-118) 125/69 (08/15 0603) SpO2:  [90 %-100 %] 92 % (08/15 0603) Last BM Date : 07/17/22 General:    Older white male in NAD Heart:  Regular rate and rhythm; no murmurs Lungs: Respirations even and unlabored, lungs CTA bilaterally Abdomen: Obese, protuberant, nontender still somewhat distended, some high-pitched bowel sounds  Neurologic:  Alert and oriented,  grossly normal neurologically. Psych:  Cooperative. Normal mood and affect.  Intake/Output from previous day: 08/14 0701 - 08/15 0700 In: 650 [P.O.:450; IV Piggyback:200] Out: 1150 [Urine:650; Stool:500] Intake/Output this shift: No intake/output data recorded.  Lab Results: Recent Labs    07/16/22 0309 07/17/22 0255 07/18/22 0331  WBC 14.6* 14.0* 10.7*  HGB 11.3* 11.2* 10.7*  HCT 33.6* 33.6* 32.7*  PLT 388 332 366   BMET Recent Labs    07/16/22 0309 07/17/22 0255 07/18/22 0331  NA 141 136 137  K 3.3* 3.2* 3.9  CL 106 102 103  CO2 '23 26 24  '$ GLUCOSE 161* 151* 141*  BUN '16 14 14  '$ CREATININE 0.81 0.80 0.95  CALCIUM 8.9 8.3* 8.7*   LFT Recent Labs    07/17/22 0255  PROT 6.6  ALBUMIN 2.5*  AST 75*  ALT 46*  ALKPHOS 100  BILITOT 1.1   PT/INR No results for input(s):  "LABPROT", "INR" in the last 72 hours.  Studies/Results: DG Abd 2 Views  Result Date: 07/17/2022 CLINICAL DATA:  Ileus EXAM: ABDOMEN - 2 VIEW COMPARISON:  Radiograph 07/15/2022 FINDINGS: Diffuse gaseous distension of bowel, with some mild bowel wall thickening. Increased gaseous distention of the colon, particularly the ascending and proximal transverse colon. No acute osseous abnormality. IMPRESSION: Persistent diffuse gaseous distension of bowel, compatible with ileus, with increased distention of the colon in comparison to prior. Electronically Signed   By: Maurine Simmering M.D.   On: 07/17/2022 11:26       Assessment / Plan:    #58 56 year old white male with persistent postop ileus, status post knee replacement 07/07/2022. Ileus complicated by EtOH withdrawal, requiring sedation, narcotics and vent support EtOH withdrawal has resolved  Ileus is slowly improving  #2 hypokalemia and hypomagnesemia-corrected #3 adult onset diabetes mellitus #4.  Anemia postop stable  Plan; will advance to full liquid diet, needs supplements 3 times daily-attrition is an issue at this point Stop Colace Change MiraLAX to 17 g p.o. twice daily in 8 ounces of water Not sure Reglan is helping much, will leave today, may discontinue tomorrow Continue to minimize any pain medications     Principal Problem:   Unilateral primary osteoarthritis, left knee Active Problems:   Status post total left knee replacement   Acute respiratory failure with hypoxia (HCC)   Ileus, postoperative ([place])  LOS: 7 days   Amy Esterwood PA-C 07/18/2022, 8:56 AM  GI ATTENDING  Interval history data reviewed.  Case discussed with Dr. Candis Schatz.  Agree with interval progress note as outlined above changes.  He continues to make slow but definite progress.  Docia Chuck. Geri Seminole., M.D. [facility] Division of Gastroenterology

## 2022-07-18 NOTE — Progress Notes (Signed)
Physical Therapy Treatment Patient Details Name: Randy Combs MRN: [id number] DOB: September 01, 1966 Today's Date: 07/18/2022   History of Present Illness 56 yo male S/P LTKA 07/07/22; 8/5 pt developed post op ileus with abdominal distention; 8/6 NG tube placed; 8/7 Pt to ICU, Precedex initiated, pt intubated 2* respiratory failure; 8/11 pt extubated PMH: gout, DM, glaucoma.    PT Comments    Pt reports R knee pain and RLE swelling which is new today. Noted pitting edema in B feet. Pt has LUE edema which he stated started ~4 days ago. RN and hospitalist notified. Performed L TKA exercises. Pt requires rest breaks after 3 repetitions of each exercise, he is fatiguing quickly. He puts forth good effort. HR 80s with activity, SaO2 96% on room air with activity.    Recommendations for follow up therapy are one component of a multi-disciplinary discharge planning process, led by the attending physician.  Recommendations may be updated based on patient status, additional functional criteria and insurance authorization.  Follow Up Recommendations  Acute inpatient rehab (3hours/day) Can patient physically be transported by private vehicle: No   Assistance Recommended at Discharge Frequent or constant Supervision/Assistance  Patient can return home with the following Two people to help with walking and/or transfers;Two people to help with bathing/dressing/bathroom;Assist for transportation;Help with stairs or ramp for entrance;Assistance with cooking/housework   Equipment Recommendations  None recommended by PT    Recommendations for Other Services       Precautions / Restrictions Precautions Precautions: Fall;Knee Precaution Comments: reviewed no pillow under knee Required Braces or Orthoses: Knee Immobilizer - Left Knee Immobilizer - Right: Discontinue once straight leg raise with < 10 degree lag Knee Immobilizer - Left: Discontinue once straight leg raise with < 10 degree  lag Restrictions Weight Bearing Restrictions: No LLE Weight Bearing: Weight bearing as tolerated Other Position/Activity Restrictions: WBAT     Mobility  Bed Mobility   Bed Mobility: Supine to Sit     Supine to sit: Max assist, HOB elevated     General bed mobility comments: pt required MAX Assist to transition to EOB due to weakness plus enlarged abdominal girth as well as assist to move L LE off bed.    Transfers     Transfers: Bed to chair/wheelchair/BSC             General transfer comment: deferred 2* elevated BP, fatigue and dizziness in sitting (BP 177/122 sitting EOB -RN notified); used maximove for bed to Counsellor via Lift Equipment: Maximove  Ambulation/Gait               General Gait Details: non amb at this time due to profound weakness   Stairs             Wheelchair Mobility    Modified Rankin (Stroke Patients Only)       Balance Overall balance assessment: Needs assistance Sitting-balance support: Feet supported, Single extremity supported Sitting balance-Leahy Scale: Good Sitting balance - Comments: dizzy sitting EOB, BP 177/122                                    Cognition Arousal/Alertness: Awake/alert Behavior During Therapy: WFL for tasks assessed/performed, Anxious Overall Cognitive Status: Within Functional Limits for tasks assessed  General Comments: AxO x 3 very pleasant and cooperative.        Exercises Total Joint Exercises Ankle Circles/Pumps: AROM, Both, 20 reps, Supine Quad Sets: AROM, Left, 10 reps, Supine Short Arc Quad: AAROM, Left, 10 reps, Supine Heel Slides: Left, AAROM, Supine, 10 reps Long Arc Quad: AAROM, Left, 5 reps, Seated Knee Flexion: AROM, Left, 10 reps, Seated    General Comments        Pertinent Vitals/Pain Pain Assessment Pain Score: 6  Pain Location: both knees (pt reports R knee pain and RLE edema is  new today, RN and MD notified) Pain Descriptors / Indicators: Grimacing, Guarding, Sore Pain Intervention(s): Limited activity within patient's tolerance, Monitored during session, Premedicated before session, Ice applied    Home Living       Type of Home:  ([place] a [place] farm with cows, chickens)                  Prior Function            PT Goals (current goals can now be found in the care plan section) Acute Rehab PT Goals Patient Stated Goal: take care of his cows and goats PT Goal Formulation: With patient Time For Goal Achievement: 07/14/22 Potential to Achieve Goals: Good Progress towards PT goals: Progressing toward goals    Frequency    7X/week      PT Plan Current plan remains appropriate    Co-evaluation PT/OT/SLP Co-Evaluation/Treatment: Yes Reason for Co-Treatment: Complexity of the patient's impairments (multi-system involvement) PT goals addressed during session: Mobility/safety with mobility;Balance;Strengthening/ROM OT goals addressed during session: ADL's and self-care      AM-PAC PT "6 Clicks" Mobility   Outcome Measure  Help needed turning from your back to your side while in a flat bed without using bedrails?: A Lot Help needed moving from lying on your back to sitting on the side of a flat bed without using bedrails?: A Lot Help needed moving to and from a bed to a chair (including a wheelchair)?: Total Help needed standing up from a chair using your arms (e.g., wheelchair or bedside chair)?: Total Help needed to walk in hospital room?: Total Help needed climbing 3-5 steps with a railing? : Total 6 Click Score: 8    End of Session Equipment Utilized During Treatment: Gait belt Activity Tolerance: Patient limited by fatigue;Patient limited by pain Patient left: in chair;with chair alarm set;with call bell/phone within reach Nurse Communication: Mobility status PT Visit Diagnosis: Difficulty in walking, not elsewhere classified  (R26.2);Unsteadiness on feet (R26.81);Pain Pain - Right/Left: Left Pain - part of body: Knee     Time: 7169-6789 PT Time Calculation (min) (ACUTE ONLY): 26 min  Charges:  $Therapeutic Exercise: 23-37 mins $Therapeutic Activity: 8-22 mins                     Blondell Reveal Kistler PT 07/18/2022  Acute Rehabilitation Services  Office [phone]

## 2022-07-18 NOTE — Progress Notes (Signed)
Nutrition Follow-up  DOCUMENTATION CODES:   Obesity unspecified  INTERVENTION:  - will order Boost Breeze once/day, each supplement provides 250 kcal and 9 grams of protein.  - will order Ensure Plus High Protein BID, each supplement provides 350 kcal and 20 grams of protein.  - will order 1 tablet multivitamin with minerals/day.  - ongoing diet advancement as medically feasible.    NUTRITION DIAGNOSIS:   Increased nutrient needs related to acute illness as evidenced by estimated needs. -revised, ongoing  GOAL:   Patient will meet greater than or equal to 90% of their needs -progressing  MONITOR:   PO intake, Supplement acceptance, Diet advancement, Labs, Weight trends  ASSESSMENT:   56 y.o. male with medical history of alcohol abuse, gout, HTN, hiatal hernia, glaucoma, uncontrolled type 2 DM, and arthritis. He has had pain to L knee area with associated functional disability d/t arthritis. He failed non-surgical management for >12 weeks. He presented to the hospital for planned L knee total arthroplasty.  Patient was extubated on 8/11. Estimated nutrition needs revised. Diet advanced to CLD on 8/13 at 0740 and to [facility] today at 1015. No documented intakes since diet advancement began.   Weight yesterday stable since 8/11; weight +31 lb from 8/4 to 8/11 though no documented weight recording between these two dates.   Mild pitting edema to RUE and moderate pitting edema to LUE, BLE, and perineal area.   CIR is evaluating patient for eligibility.    Labs reviewed; CBGs: 133 and 116 mg/dl, Ca: 8.7 mg/dl. Medications reviewed; sliding scale novolog, 10 mg IV reglan TID, 40 mg oral protonix/day, 17 g miralax BID, 40 mEq Klor-Con x2 doses 8/14, 1 tablet senokot/day, 100 mg IV thiamine/day.    Diet Order:   Diet Order             Diet full liquid Room service appropriate? Yes; Fluid consistency: Thin  Diet effective now                   EDUCATION NEEDS:   No  education needs have been identified at this time  Skin:  Skin Assessment: Skin Integrity Issues: Skin Integrity Issues:: Incisions Incisions: L knee (8/4)  Last BM:  8/14 (type 7 x2, both medium amounts)  Height:   Ht Readings from Last 1 Encounters:  07/13/22 '5\' 10"'$  (1.778 m)    Weight:   Wt Readings from Last 1 Encounters:  07/17/22 (!) 138 kg    BMI:  Body mass index is 43.65 kg/m.  Estimated Nutritional Needs:  Kcal:  2300-2500 kcal Protein:  115-130 grams Fluid:  >/= 2.2 L/day     Jarome Matin, MS, RD, LDN, CNSC Registered Dietitian II Inpatient Clinical Nutrition RD pager # and on-call/weekend pager # available in [facility]

## 2022-07-19 ENCOUNTER — Encounter (HOSPITAL_COMMUNITY): Payer: 59

## 2022-07-19 DIAGNOSIS — I160 Hypertensive urgency: Secondary | ICD-10-CM

## 2022-07-19 DIAGNOSIS — G9341 Metabolic encephalopathy: Secondary | ICD-10-CM | POA: Diagnosis not present

## 2022-07-19 DIAGNOSIS — I10 Essential (primary) hypertension: Secondary | ICD-10-CM | POA: Diagnosis not present

## 2022-07-19 DIAGNOSIS — F10931 Alcohol use, unspecified with withdrawal delirium: Secondary | ICD-10-CM | POA: Diagnosis not present

## 2022-07-19 DIAGNOSIS — K9189 Other postprocedural complications and disorders of digestive system: Secondary | ICD-10-CM | POA: Diagnosis not present

## 2022-07-19 LAB — CBC
HCT: 30.1 % — ABNORMAL LOW (ref 39.0–52.0)
Hemoglobin: 9.9 g/dL — ABNORMAL LOW (ref 13.0–17.0)
MCH: 34 pg (ref 26.0–34.0)
MCHC: 32.9 g/dL (ref 30.0–36.0)
MCV: 103.4 fL — ABNORMAL HIGH (ref 80.0–100.0)
Platelets: 379 10*3/uL (ref 150–400)
RBC: 2.91 MIL/uL — ABNORMAL LOW (ref 4.22–5.81)
RDW: 14.7 % (ref 11.5–15.5)
WBC: 14.5 10*3/uL — ABNORMAL HIGH (ref 4.0–10.5)
nRBC: 0 % (ref 0.0–0.2)

## 2022-07-19 LAB — GLUCOSE, CAPILLARY
Glucose-Capillary: 152 mg/dL — ABNORMAL HIGH (ref 70–99)
Glucose-Capillary: 144 mg/dL — ABNORMAL HIGH (ref 70–99)
Glucose-Capillary: 147 mg/dL — ABNORMAL HIGH (ref 70–99)
Glucose-Capillary: 114 mg/dL — ABNORMAL HIGH (ref 70–99)

## 2022-07-19 LAB — BASIC METABOLIC PANEL
Anion gap: 10 (ref 5–15)
BUN: 12 mg/dL (ref 6–20)
CO2: 27 mmol/L (ref 22–32)
Calcium: 8.3 mg/dL — ABNORMAL LOW (ref 8.9–10.3)
Chloride: 100 mmol/L (ref 98–111)
Creatinine, Ser: 0.88 mg/dL (ref 0.61–1.24)
GFR, Estimated: >60 mL/min (ref >60)
Glucose, Bld: 153 mg/dL — ABNORMAL HIGH (ref 70–99)
Potassium: 3.7 mmol/L (ref 3.5–5.1)
Sodium: 137 mmol/L (ref 135–145)

## 2022-07-19 MED ORDER — THIAMINE HCL 100 MG PO TABS
100.0000 mg | ORAL_TABLET | Freq: Every day | ORAL | Status: DC
  Administered 2022-07-20: 100 mg via ORAL
  Filled 2022-07-19: qty 1

## 2022-07-19 MED ORDER — KETOROLAC TROMETHAMINE 30 MG/ML IJ SOLN
30.0000 mg | Freq: Once | INTRAMUSCULAR | Status: AC
Start: 2022-07-19 — End: 2022-07-19
  Administered 2022-07-19: 30 mg via INTRAVENOUS
  Filled 2022-07-19: qty 1

## 2022-07-19 NOTE — Progress Notes (Signed)
Patient ID: Randy Combs, male   DOB: Apr 02, 1966, 56 y.o.   MRN: [id number] Patient ambulating with physical therapy in the hallway.  Reports that his left knee pain is minimal compared to his right leg pain.  He reports that he did manage to get up on his own last night and get a shower feels that this was a turning point for him.  Physical exam: General: No acute distress. Left knee: Surgical incisions healing well.  He is ambulating with therapy in the hallway and appears to be in no significant pain at this point.  Impression: Postop day 12 status post left total knee arthroplasty  Plan: Continue to work on gait balance, ambulation, left knee strengthening and range of motion.

## 2022-07-19 NOTE — Progress Notes (Addendum)
Patient ID: Randy Combs, male   DOB: 30-Nov-1966, 56 y.o.   MRN: [id number]    Progress Note   Subjective   Day # 12  CC; status post knee replacement, prolonged postop ileus complicated by EtOH withdrawal requiring vent support and sedation (now resolved)  No new labs this a.m.  Patient was able to get up and ambulate a bit yesterday, says he got himself to the shower today.  Overall feeling much better still not much appetite but tolerating full liquids without difficulty, no complaints of nausea, no vomiting abdomen does not feel distended or tight to the patient Uncertain about having the rectal tube removed because he is having a lot of problems with pain secondary to gout which is making it difficult for him to be mobile   Objective   Vital signs in last 24 hours: Temp:  [97.5 F (36.4 C)-98.1 F (36.7 C)] 97.5 F (36.4 C) (08/16 0603) Pulse Rate:  [79-99] 99 (08/16 0603) Resp:  [17-20] 17 (08/16 0603) BP: (104-172)/(50-122) 155/91 (08/16 0603) SpO2:  [91 %-99 %] 99 % (08/16 0603) Weight:  [134.9 kg] 134.9 kg (08/16 0500) Last BM Date : 07/18/22 General:    Older white male in NAD, pleasant Heart:  Regular rate and rhythm; no murmurs Lungs: Respirations even and unlabored, lungs CTA bilaterally Abdomen: Much softer, nondistended, bowel sounds present, nontender  -rectal tube in with loose stool Extremities: Right knee swollen-status post very recent replacement Neurologic:  Alert and oriented,  grossly normal neurologically. Psych:  Cooperative. Normal mood and affect.  Intake/Output from previous day: 08/15 0701 - 08/16 0700 In: 850 [P.O.:850] Out: 3230 [Urine:2100; Stool:1130] Intake/Output this shift: No intake/output data recorded.  Lab Results: Recent Labs    07/17/22 0255 07/18/22 0331  WBC 14.0* 10.7*  HGB 11.2* 10.7*  HCT 33.6* 32.7*  PLT 332 366   BMET Recent Labs    07/17/22 0255 07/18/22 0331  NA 136 137  K 3.2* 3.9  CL 102 103  CO2 26  24  GLUCOSE 151* 141*  BUN 14 14  CREATININE 0.80 0.95  CALCIUM 8.3* 8.7*   LFT Recent Labs    07/17/22 0255  PROT 6.6  ALBUMIN 2.5*  AST 75*  ALT 46*  ALKPHOS 100  BILITOT 1.1   PT/INR No results for input(s): "LABPROT", "INR" in the last 72 hours.  Studies/Results: DG Abd 2 Views  Result Date: 07/17/2022 CLINICAL DATA:  Ileus EXAM: ABDOMEN - 2 VIEW COMPARISON:  Radiograph 07/15/2022 FINDINGS: Diffuse gaseous distension of bowel, with some mild bowel wall thickening. Increased gaseous distention of the colon, particularly the ascending and proximal transverse colon. No acute osseous abnormality. IMPRESSION: Persistent diffuse gaseous distension of bowel, compatible with ileus, with increased distention of the colon in comparison to prior. Electronically Signed   By: Maurine Simmering M.D.   On: 07/17/2022 11:26       Assessment / Plan:    #74 56 year old white male status post right knee replacement 12/12/3788 complicated by postop ileus-prolonged secondary to additional complication of EtOH withdrawal requiring sedation and vent support  Patient overall doing much better EtOH withdrawal completely resolved  Ileus continues to improve, abdomen much softer tolerating full liquids and having loose to liquid stools on twice daily MiraLAX  Plan; advance to carb modified diet as tolerated We will discontinue IV Reglan Continue daily PPI Continue twice daily MiraLAX for now-can probably decrease to once daily tomorrow We will leave the rectal tube in today at patient  request can come out at any point once he feels he is able to get up to the bathroom quick enough  GI will sign off, please call for questions or problems       Principal Problem:   Ileus, postoperative ([place]) Active Problems:   Essential hypertension   Uncontrolled type 2 diabetes mellitus with hyperglycemia, without long-term current use of insulin (HCC)   Unilateral primary osteoarthritis, left knee   Status  post total left knee replacement   Acute respiratory failure with hypoxia (HCC)   Hypertensive urgency   Acute metabolic encephalopathy   Alcohol withdrawal ([place])     LOS: 8 days   Amy Esterwood PA-C 07/19/2022, 8:51 AM  GI ATTENDING  Interval history data reviewed.  Patient seen and examined.  Agree with comprehensive interval progress note as outlined above.  Significant clinical improvement of ileus.  Agree with recommendations as outlined above.  GI will sign off.  Docia Chuck. Geri [place]in[place]., M.D. [facility] Division of Gastroenterology

## 2022-07-19 NOTE — Plan of Care (Signed)
  Problem: Education: Goal: Knowledge of General Education information will improve Description: Including pain rating scale, medication(s)/side effects and non-pharmacologic comfort measures Outcome: Progressing   Problem: Activity: Goal: Risk for activity intolerance will decrease Outcome: Progressing   Problem: Skin Integrity: Goal: Risk for impaired skin integrity will decrease Outcome: Progressing   

## 2022-07-19 NOTE — Progress Notes (Signed)
Physical Therapy Treatment Patient Details Name: Randy Combs MRN: [id number] DOB: [birth year] Today's Date: 07/19/2022   History of Present Illness 56 yo male S/P LTKA 07/07/22; 8/5 pt developed post op ileus with abdominal distention; 8/6 NG tube placed; 8/7 Pt to ICU, Precedex initiated, pt intubated 2* respiratory failure; 8/11 pt extubated PMH: gout, DM, glaucoma.    PT Comments    Spouse present PM session General transfer comment: Mod Assist + 1 by pulling on safety belt shifting weight forward, pt was able to rise from recliner. Demenstarted and educated Spouse on this tech to asisst Pt. General Gait Details: with Spouse "hands on" assisted using safety belt under Therapist direction on safe handling.  Pt instructed on safety as well, NOT to let go of walker as he talks and avoid quick upperbody turns when he talks. Will chat MD to D/C Flexi Seal now that pt is more ambulatory.   Recommendations for follow up therapy are one component of a multi-disciplinary discharge planning process, led by the attending physician.  Recommendations may be updated based on patient status, additional functional criteria and insurance authorization.  Follow Up Recommendations  Acute inpatient rehab (3hours/day) Can patient physically be transported by private vehicle: No   Assistance Recommended at Discharge Frequent or constant Supervision/Assistance  Patient can return home with the following Two people to help with walking and/or transfers;Two people to help with bathing/dressing/bathroom;Assist for transportation;Help with stairs or ramp for entrance;Assistance with cooking/housework   Equipment Recommendations  None recommended by PT    Recommendations for Other Services       Precautions / Restrictions Precautions Precautions: Fall;Knee Precaution Comments: reviewed no pillow under knee Restrictions Weight Bearing Restrictions: No LLE Weight Bearing: Weight bearing as tolerated      Mobility  Bed Mobility Overal bed mobility: Needs Assistance Bed Mobility: Supine to Sit     Supine to sit: Supervision, Min guard     General bed mobility comments: OOB in recliner    Transfers Overall transfer level: Needs assistance Equipment used: Rolling walker (2 wheels) Transfers: Sit to/from Stand Sit to Stand: Mod assist           General transfer comment: Mod Assist + 1 by pulling on safety belt shifting weight forward, pt was able to rise from recliner. Demenstarted and educated Spouse on this tech to asisst Pt.    Ambulation/Gait Ambulation/Gait assistance: Supervision, Min guard Gait Distance (Feet): 109 Feet Assistive device: Rolling walker (2 wheels) Gait Pattern/deviations: Step-to pattern, Decreased stance time - left, Antalgic, Knee flexed in stance - left Gait velocity: decreased     General Gait Details: with Spouse "hands on" assisted using safety belt under Therapist direction on safe handling.  Pt instructed on safety as well, NOT to let go of walker as he talks and avoid quick upperbody turns when he talks.   Stairs             Wheelchair Mobility    Modified Rankin (Stroke Patients Only)       Balance                                            Cognition Arousal/Alertness: Awake/alert Behavior During Therapy: WFL for tasks assessed/performed, Anxious Overall Cognitive Status: Within Functional Limits for tasks assessed  General Comments: AxO x 3 very pleasant and cooperative.        Exercises      General Comments        Pertinent Vitals/Pain Pain Assessment Pain Assessment: 0-10 Pain Score: 7  Pain Location: R knee 7/10 and L knee 5/10 Pain Descriptors / Indicators: Grimacing, Guarding, Sore Pain Intervention(s): Monitored during session, Premedicated before session, Repositioned, Ice applied    Home Living                           Prior Function            PT Goals (current goals can now be found in the care plan section) Progress towards PT goals: Progressing toward goals    Frequency    7X/week      PT Plan Current plan remains appropriate    Co-evaluation              AM-PAC PT "6 Clicks" Mobility   Outcome Measure  Help needed turning from your back to your side while in a flat bed without using bedrails?: A Little Help needed moving from lying on your back to sitting on the side of a flat bed without using bedrails?: A Little Help needed moving to and from a bed to a chair (including a wheelchair)?: A Little Help needed standing up from a chair using your arms (e.g., wheelchair or bedside chair)?: A Little Help needed to walk in hospital room?: A Little Help needed climbing 3-5 steps with a railing? : A Lot 6 Click Score: 17    End of Session Equipment Utilized During Treatment: Gait belt Activity Tolerance: Patient tolerated treatment well Patient left: in chair;with chair alarm set;with call bell/phone within reach Nurse Communication: Mobility status PT Visit Diagnosis: Difficulty in walking, not elsewhere classified (R26.2);Unsteadiness on feet (R26.81);Pain Pain - Right/Left: Left Pain - part of body: Knee     Time: 4503-8882 PT Time Calculation (min) (ACUTE ONLY): 24 min  Charges:  $Gait Training: 8-22 mins $Therapeutic Activity: 8-22 mins                     Rica Koyanagi  PTA Acute  Rehabilitation Services Office M-F          [phone] Weekend pager [phone]

## 2022-07-19 NOTE — Progress Notes (Signed)
Inpatient Rehab Admissions Coordinator:   Pt appears to be mobilizing much better, supervision/min guard for gait up to 115' with RW and reportedly able to ambulate to shower last night without assist.  Feel he likely will not require an intense rehab stay at this level of function.  Recommend other rehab venues be pursued.    Shann Medal, PT, DPT Admissions Coordinator [phone] 07/19/22  2:59 PM

## 2022-07-19 NOTE — Progress Notes (Signed)
Physical Therapy Treatment Patient Details Name: Randy Combs MRN: [id number] DOB: Oct 21, 1966 Today's Date: 07/19/2022   History of Present Illness 56 yo male S/P LTKA 07/07/22; 8/5 pt developed post op ileus with abdominal distention; 8/6 NG tube placed; 8/7 Pt to ICU, Precedex initiated, pt intubated 2* respiratory failure; 8/11 pt extubated PMH: gout, DM, glaucoma.    PT Comments    POD # 12 L TKR s/p ILUES, VDRF Pt AxO x 3 and had quite the night.  Pt stated he got OOB by himself last night and walked into the bathroom (using walker) and got himself in the shower.  "I stayed there for a long time before they knew I was in there".  Pt c/o being soiled from rectal tube.  "I couldn't take it any long, so I took matters onto my own hands". After sharing this information with me, I grabbed a [place] walker instead of LIFT and asissted pt with amb in hallway.  General transfer comment: + 2 side by side assist and elevated bed, pt was able to rise using forward momentum while holding onto Bare walker. General Gait Details: pt tolerated amb in hallway with Bari walker at [place] following for safety.  Met [employer] PA in hallway during pt's walk.  Pt c/o R knee hurting more then his L TKR.  B LE present with third spacing edema. Returned to room in recliner.  Will see pt again this afternoon. Pt IS mobile enough to remove rectal Tube.   Recommendations for follow up therapy are one component of a multi-disciplinary discharge planning process, led by the attending physician.  Recommendations may be updated based on patient status, additional functional criteria and insurance authorization.  Follow Up Recommendations  Acute inpatient rehab (3hours/day) Can patient physically be transported by private vehicle: No   Assistance Recommended at Discharge Frequent or constant Supervision/Assistance  Patient can return home with the following Two people to help with walking  and/or transfers;Two people to help with bathing/dressing/bathroom;Assist for transportation;Help with stairs or ramp for entrance;Assistance with cooking/housework   Equipment Recommendations  None recommended by PT    Recommendations for Other Services       Precautions / Restrictions Precautions Precautions: Fall;Knee Precaution Comments: reviewed no pillow under knee Restrictions Weight Bearing Restrictions: No LLE Weight Bearing: Weight bearing as tolerated     Mobility  Bed Mobility Overal bed mobility: Needs Assistance Bed Mobility: Supine to Sit     Supine to sit: Supervision, Min guard     General bed mobility comments: pt self able using belt to support L LE off bed.  "I got to do this".    Transfers Overall transfer level: Needs assistance Equipment used: Rolling walker (2 wheels) (bari) Transfers: Sit to/from Stand Sit to Stand: +2 safety/equipment, Min assist, Mod assist           General transfer comment: + 2 side by side assist and elevated bed, pt was able to rise using forward momentum while holding onto Bare walker.    Ambulation/Gait Ambulation/Gait assistance: Supervision, Min guard Gait Distance (Feet): 115 Feet Assistive device: Rolling walker (2 wheels) Judie Petit) Gait Pattern/deviations: Step-to pattern, Decreased stance time - left, Antalgic, Knee flexed in stance - left Gait velocity: decreased     General Gait Details: pt tolerated amb in hallway with Bari walker at [place] and recliner following for safety.  Met [employer] PA in hallway during pt's walk.  Pt c/o R knee  hurting more then his L TKR.  B LE present with third spacing edema.   Stairs             Wheelchair Mobility    Modified Rankin (Stroke Patients Only)       Balance                                            Cognition Arousal/Alertness: Awake/alert Behavior During Therapy: WFL for tasks assessed/performed, Anxious Overall  Cognitive Status: Within Functional Limits for tasks assessed                                 General Comments: AxO x 3 very pleasant and cooperative.        Exercises      General Comments        Pertinent Vitals/Pain Pain Assessment Pain Assessment: 0-10 Pain Score: 7  Pain Location: R knee 7/10 and L knee 5/10 Pain Descriptors / Indicators: Grimacing, Guarding, Sore Pain Intervention(s): Monitored during session, Premedicated before session, Repositioned, Ice applied    Home Living                          Prior Function            PT Goals (current goals can now be found in the care plan section) Progress towards PT goals: Progressing toward goals    Frequency    7X/week      PT Plan Current plan remains appropriate    Co-evaluation              AM-PAC PT "6 Clicks" Mobility   Outcome Measure  Help needed turning from your back to your side while in a flat bed without using bedrails?: A Little Help needed moving from lying on your back to sitting on the side of a flat bed without using bedrails?: A Little Help needed moving to and from a bed to a chair (including a wheelchair)?: A Little Help needed standing up from a chair using your arms (e.g., wheelchair or bedside chair)?: A Little Help needed to walk in hospital room?: A Little Help needed climbing 3-5 steps with a railing? : A Lot 6 Click Score: 17    End of Session Equipment Utilized During Treatment: Gait belt Activity Tolerance: Patient tolerated treatment well Patient left: in chair;with chair alarm set;with call bell/phone within reach Nurse Communication: Mobility status PT Visit Diagnosis: Difficulty in walking, not elsewhere classified (R26.2);Unsteadiness on feet (R26.81);Pain Pain - Right/Left: Left Pain - part of body: Knee     Time: 5732-2025 PT Time Calculation (min) (ACUTE ONLY): 26 min  Charges:  $Gait Training: 8-22 mins $Therapeutic  Activity: 8-22 mins                     Rica Koyanagi  PTA Acute  Rehabilitation Services Office M-F          [phone] Weekend pager [phone]

## 2022-07-19 NOTE — Progress Notes (Addendum)
[facility]  PROGRESS NOTE  Randy Combs CNO:[id number] DOB: 1966-02-06 DOA: 07/07/2022 PCP: Owens Loffler, MD   Brief HPI:   56 year old male admitted for elective knee surgery on 07/07/2022.  He was mated overnight for observation.  Unfortunately postoperatively he had a complicated course with postop ileus and associated distention causing shortness of breath.  NG tube was placed.  Patient developed worsening agitation, tachycardia, restlessness and tremors.  Patient drinks alcohol and there was concern for significant alcohol withdrawal and DTs so he was transferred to ICU.  Patient was unable to receive oral benzodiazepines to help control symptoms due to his ileus.  He had significant agitation was only intermittently redirectable.  Progressed to respiratory failure requiring intubation.  8/4 Admit for elective knee surgery 8/5 Abdominal distention, post op ileus 8/6 NG tube placement for gastric decompression 8/7 Transfer to ICU, initiated on Precedex and phenobarbital. Intubated for respiratory failure. 8/9 Remains on vent sedated with distended abdomen. AKI worse today.  8L+ 8/10 Trickle TF initiated.  BM early am.  8/11 Extubated 8/12 Cleviprex gtt started for HTN 8/15 Transferred to [facility]   Subjective   Patient complains of worsening of swelling in left upper extremity and right lower extremity.  Also complains of pain in the right knee joint and left first metacarpophalangeal joint.  Patient does have history of gout and takes indomethacin and colchicine at home.  Colchicine was started yesterday with no significant improvement.   Assessment/Plan:    Postop ileus -Significantly improved, diet has been advanced to carb modified diet -Patient has rectal tube in place for liquid stools -GI has signed off, recommends to remove rectal tube once patient able to ambulate to bathroom -Reglan has been discontinued -GI recommends to change MiraLAX to once a day from  tomorrow morning -Continue Protonix 40 mg p.o. daily  Hypertensive urgency with history of hypertension -He was started on Cleviprex which has been weaned off -Continue Coreg, Norvasc, clonidine(clonidine was added to also manage alcohol withdrawal) -Patient's blood pressure has been labile, clonidine will be tapered off over the next 2 days  Acute metabolic encephalopathy/due to alcohol withdrawal -Resolved  Gout -Patient has flare of acute gout -Started on colchicine 0.6 mg p.o. twice daily -We will give 1 dose of ketorolac 30 mg IV -If no improvement consider prednisone  S/p left TKA -Orthopedic surgery following -PT/OT consulted  Diabetes mellitus type 2 -Continue sliding scale insulin with NovoLog, moderate sliding scale -CBG well controlled    Medications     acetaminophen  650 mg Oral Q6H   amLODipine  10 mg Oral Daily   carvedilol  25 mg Oral BID WC   Chlorhexidine Gluconate Cloth  6 each Topical Daily   cloNIDine  0.1 mg Oral Daily   colchicine  0.6 mg Oral BID   feeding supplement  1 Container Oral Q24H   feeding supplement  237 mL Oral BID BM   heparin injection (subcutaneous)  5,000 Units Subcutaneous Q8H   insulin aspart  0-15 Units Subcutaneous TID WC   multivitamin with minerals  1 tablet Oral Daily   pantoprazole  40 mg Oral Daily   polyethylene glycol  17 g Oral BID   senna  1 tablet Oral Daily   [START ON 07/20/2022] thiamine  100 mg Oral Daily     Data Reviewed:   CBG:  Recent Labs  Lab 07/18/22 1139 07/18/22 1658 07/18/22 2116 07/19/22 0814 07/19/22 1217  GLUCAP 116* 165* 168* 152* 144*  SpO2: 100 % O2 Flow Rate (L/min): 2 L/min FiO2 (%): 30 %    Vitals:   07/18/22 2115 07/19/22 0500 07/19/22 0603 07/19/22 1324  BP: (!) 104/50  (!) 155/91 115/67  Pulse: 91  99 84  Resp: '17  17 18  '$ Temp: 97.8 F (36.6 C)  (!) 97.5 F (36.4 C) 98.2 F (36.8 C)  TempSrc: Oral  Oral Oral  SpO2: 95%  99% 100%  Weight:  134.9 kg    Height:           Data Reviewed:  Basic Metabolic Panel: Recent Labs  Lab 07/13/22 0612 07/14/22 0718 07/15/22 0251 07/15/22 6503 07/15/22 0929 07/16/22 0309 07/17/22 0255 07/18/22 0331 07/19/22 0905  NA 133* 137 141 116* 144 141 136 137 137  K 3.9 3.6 3.4* 2.6* 2.9* 3.3* 3.2* 3.9 3.7  CL 102 107 107 86* 114* 106 102 103 100  CO2 19* 20* 20* 17* 18* '23 26 24 27  '$ GLUCOSE 113* 127* 141* 128* 148* 161* 151* 141* 153*  BUN 47* 25* '17 14 15 16 14 14 12  '$ CREATININE 1.78* 1.07 1.04 0.80 0.86 0.81 0.80 0.95 0.88  CALCIUM 8.1* 8.4* 8.7* 7.0* 6.9* 8.9 8.3* 8.7* 8.3*  MG 2.6* 2.1 1.8  --   --  2.1 1.6* 2.1  --   PHOS 4.0 1.6* 1.9* 1.5*  --   --   --  2.8  --     CBC: Recent Labs  Lab 07/15/22 0251 07/16/22 0309 07/17/22 0255 07/18/22 0331 07/19/22 0905  WBC 12.8* 14.6* 14.0* 10.7* 14.5*  HGB 9.9* 11.3* 11.2* 10.7* 9.9*  HCT 29.9* 33.6* 33.6* 32.7* 30.1*  MCV 104.9* 101.8* 105.7* 105.8* 103.4*  PLT 356 388 332 366 379    LFT Recent Labs  Lab 07/14/22 0718 07/16/22 0309 07/17/22 0255  AST 33 49* 75*  ALT 21 31 46*  ALKPHOS 82 93 100  BILITOT 1.1 1.0 1.1  PROT 6.3* 6.9 6.6  ALBUMIN 2.5* 2.9* 2.5*     Antibiotics: Anti-infectives (From admission, onward)    Start     Dose/Rate Route Frequency Ordered Stop   07/13/22 1800  Ampicillin-Sulbactam (UNASYN) 3 g in sodium chloride 0.9 % 100 mL IVPB        3 g 200 mL/hr over 30 Minutes Intravenous Every 6 hours 07/13/22 1259 07/15/22 1837   07/12/22 1000  vancomycin (VANCOREADY) IVPB 1250 mg/250 mL  Status:  Discontinued        1,250 mg 166.7 mL/hr over 90 Minutes Intravenous Every 24 hours 07/11/22 0833 07/12/22 0728   07/12/22 0830  piperacillin-tazobactam (ZOSYN) IVPB 3.375 g  Status:  Discontinued        3.375 g 12.5 mL/hr over 240 Minutes Intravenous Every 8 hours 07/12/22 0753 07/13/22 1028   07/11/22 0900  vancomycin (VANCOREADY) IVPB 2000 mg/400 mL        2,000 mg 200 mL/hr over 120 Minutes Intravenous  Once 07/11/22  0743 07/11/22 1153   07/11/22 0830  ceFEPIme (MAXIPIME) 2 g in sodium chloride 0.9 % 100 mL IVPB  Status:  Discontinued        2 g 200 mL/hr over 30 Minutes Intravenous Every 8 hours 07/11/22 0743 07/12/22 0728   07/07/22 1430  ceFAZolin (ANCEF) IVPB 2g/100 mL premix        2 g 200 mL/hr over 30 Minutes Intravenous Every 6 hours 07/07/22 1210 07/07/22 2031   07/07/22 0630  ceFAZolin (ANCEF) IVPB 3g/100 mL premix  3 g 200 mL/hr over 30 Minutes Intravenous On call to O.R. 07/07/22 0960 07/07/22 0835        DVT prophylaxis: Heparin  Code Status: Full code  Family Communication: No family at bedside   CONSULTS gastroenterology, orthopedics   Objective    Physical Examination:   General-appears in no acute distress Heart-S1-S2, regular, no murmur auscultated Lungs-clear to auscultation bilaterally, no wheezing or crackles auscultated Abdomen-soft, nontender, no organomegaly Extremities-no edema in the lower extremities Neuro-alert, oriented x3, no focal deficit noted   Status is: Inpatient: Postop ileus         [place]   [facility] If 7PM-7AM, please contact night-coverage at [email or web address], Office  [phone]   07/19/2022, 1:45 PM  LOS: 8 days

## 2022-07-20 ENCOUNTER — Encounter: Payer: 59 | Admitting: Orthopaedic Surgery

## 2022-07-20 DIAGNOSIS — I10 Essential (primary) hypertension: Secondary | ICD-10-CM | POA: Diagnosis not present

## 2022-07-20 DIAGNOSIS — I160 Hypertensive urgency: Secondary | ICD-10-CM | POA: Diagnosis not present

## 2022-07-20 DIAGNOSIS — F10931 Alcohol use, unspecified with withdrawal delirium: Secondary | ICD-10-CM | POA: Diagnosis not present

## 2022-07-20 DIAGNOSIS — K9189 Other postprocedural complications and disorders of digestive system: Secondary | ICD-10-CM | POA: Diagnosis not present

## 2022-07-20 LAB — GLUCOSE, CAPILLARY
Glucose-Capillary: 116 mg/dL — ABNORMAL HIGH (ref 70–99)
Glucose-Capillary: 120 mg/dL — ABNORMAL HIGH (ref 70–99)

## 2022-07-20 MED ORDER — THIAMINE HCL 100 MG PO TABS: 100.0000 mg | ORAL_TABLET | Freq: Every day | ORAL | 0 refills | Status: DC

## 2022-07-20 MED ORDER — SENNA 8.6 MG PO TABS: 1.0000 | ORAL_TABLET | Freq: Every day | ORAL | 0 refills | Status: DC | PRN

## 2022-07-20 MED ORDER — POLYETHYLENE GLYCOL 3350 17 G PO PACK: 17.0000 g | PACK | Freq: Every day | ORAL | 0 refills | Status: DC | PRN

## 2022-07-20 MED ORDER — COLCHICINE 0.6 MG PO TABS: 0.6000 mg | ORAL_TABLET | Freq: Two times a day (BID) | ORAL | 2 refills | Status: DC | PRN

## 2022-07-20 MED ORDER — CARVEDILOL 25 MG PO TABS: 25.0000 mg | ORAL_TABLET | Freq: Two times a day (BID) | ORAL | 2 refills | Status: DC

## 2022-07-20 MED ORDER — AMLODIPINE BESYLATE 10 MG PO TABS: 10.0000 mg | ORAL_TABLET | Freq: Every day | ORAL | 2 refills | Status: DC

## 2022-07-20 NOTE — Discharge Summary (Signed)
Physician Discharge Summary   Patient: Randy Combs MRN: [id number] DOB: 07-Feb-1966  Admit date:     07/07/2022  Discharge date: 07/20/22  Discharge Physician: Oswald Hillock   PCP: Owens Loffler, MD   Recommendations at discharge:   Follow-up orthopedics surgery as outpatient in 1 week  Discharge Diagnoses: Principal Problem:   Ileus, postoperative ([place]) Active Problems:   Essential hypertension   Uncontrolled type 2 diabetes mellitus with hyperglycemia, without long-term current use of insulin (HCC)   Unilateral primary osteoarthritis, left knee   Status post total left knee replacement   Acute respiratory failure with hypoxia (HCC)   Hypertensive urgency   Acute metabolic encephalopathy   Alcohol withdrawal ([place])  Resolved Problems:   * No resolved hospital problems. *  Hospital Course: 56 year old male admitted for elective knee surgery on 07/07/2022.  He was mated overnight for observation.  Unfortunately postoperatively he had a complicated course with postop ileus and associated distention causing shortness of breath.  NG tube was placed.  Patient developed worsening agitation, tachycardia, restlessness and tremors.  Patient drinks alcohol and there was concern for significant alcohol withdrawal and DTs so he was transferred to ICU.  Patient was unable to receive oral benzodiazepines to help control symptoms due to his ileus.  He had significant agitation was only intermittently redirectable.  Progressed to respiratory failure requiring intubation.   8/4 Admit for elective knee surgery 8/5 Abdominal distention, post op ileus 8/6 NG tube placement for gastric decompression 8/7 Transfer to ICU, initiated on Precedex and phenobarbital. Intubated for respiratory failure. 8/9 Remains on vent sedated with distended abdomen. AKI worse today.  8L+ 8/10 Trickle TF initiated.  BM early am.  8/11 Extubated 8/12 Cleviprex gtt started for HTN 8/15 Transferred to [facility]  and Plan:  Postop ileus -Significantly improved, diet has been advanced to carb modified diet -Patient has rectal tube in place for liquid stools -GI has signed off, recommends to remove rectal tube once patient able to ambulate to bathroom -Reglan has been discontinued -GI recommends to change MiraLAX to once a day from tomorrow morning -We will discharge home on MiraLAX daily as needed   Hypertensive urgency with history of hypertension -He was started on Cleviprex which has been weaned off -Continue Coreg, Norvasc, clonidine(clonidine was added to also manage alcohol withdrawal) -Patient's blood pressure has been labile, clonidine has been tapered off   Acute metabolic encephalopathy/due to alcohol withdrawal -Resolved   Gout -Patient has flare of acute gout -Started on colchicine 0.6 mg p.o. twice daily -Resolved after 1 dose of ketorolac 30 g IV x1 -Continue taking colchicine 0.6 mg twice daily as needed   S/p left TKA -Orthopedic surgery following -PT/OT consulted -Patient go home with home health PT -No need of DVT prophylaxis as patient received heparin in the hospital for 2 weeks after surgery, discussed with Dr. Ninfa Linden  -Follow-up Dr. Sherlene Shams in 1 week as outpatient   Diabetes mellitus type 2 -Hemoglobin A1c 6.3 -Patient does not taking medication for diabetes mellitus -Follow-up PCP to discuss starting taking medication for diabetes.         Consultants:  Procedures performed:  Disposition: Home Diet recommendation:  Discharge Diet Orders (From admission, onward)     Start     Ordered   07/20/22 0000  Diet - low sodium heart healthy        07/20/22 1331           Regular diet DISCHARGE MEDICATION: Allergies as  of 07/20/2022   No Active Allergies      Medication List     STOP taking these medications    cyclobenzaprine 10 MG tablet Commonly known as: FLEXERIL   omeprazole 20 MG capsule Commonly known as: PRILOSEC       TAKE  these medications    amLODipine 10 MG tablet Commonly known as: NORVASC Take 1 tablet (10 mg total) by mouth daily. Start taking on: July 21, 2022   carvedilol 25 MG tablet Commonly known as: COREG Take 1 tablet (25 mg total) by mouth 2 (two) times daily with a meal.   colchicine 0.6 MG tablet Take 1 tablet (0.6 mg total) by mouth 2 (two) times daily as needed (Gout). What changed:  when to take this reasons to take this   indomethacin 50 MG capsule Commonly known as: INDOCIN TAKE 1 CAPSULE BY MOUTH THREE TIMES A DAY AS NEEDED   methocarbamol 500 MG tablet Commonly known as: ROBAXIN Take 1 tablet (500 mg total) by mouth every 6 (six) hours as needed for muscle spasms.   oxyCODONE 5 MG immediate release tablet Commonly known as: Oxy IR/ROXICODONE Take 1-2 tablets (5-10 mg total) by mouth every 4 (four) hours as needed for moderate pain (pain score 4-6).   polyethylene glycol 17 g packet Commonly known as: MIRALAX / GLYCOLAX Take 17 g by mouth daily as needed for moderate constipation.   senna 8.6 MG Tabs tablet Commonly known as: SENOKOT Take 1 tablet (8.6 mg total) by mouth daily as needed for mild constipation.   thiamine 100 MG tablet Commonly known as: VITAMIN B1 Take 1 tablet (100 mg total) by mouth daily. Start taking on: July 21, 2022   traMADol 50 MG tablet Commonly known as: ULTRAM Take 2 tablets (100 mg total) by mouth every 6 (six) hours as needed.               Durable Medical Equipment  (From admission, onward)           Start     Ordered   07/07/22 1211  DME 3 n 1  Once        07/07/22 1210   07/07/22 1211  DME Walker rolling  Once       Question Answer Comment  Walker: With 5 Inch Wheels   Patient needs a walker to treat with the following condition Status post total left knee replacement      07/07/22 1210            Follow-up Information     Mcarthur Rossetti, MD. Schedule an appointment as soon as possible for a  visit in 1 week(s).   Specialty: Orthopedic Surgery Contact information: [place] [address] [place], [place] Follow up.   Specialty: [facility] Why: providing home physical therapy visits Contact information: [place] [address] [phone]                Discharge Exam: Filed Weights   07/16/22 0500 07/17/22 0500 07/19/22 0500  Weight: (!) 138.5 kg (!) 138 kg 134.9 kg   General-appears in no acute distress Heart-S1-S2, regular, no murmur auscultated Lungs-clear to auscultation bilaterally, no wheezing or crackles auscultated Abdomen-soft, nontender, no organomegaly Extremities-no edema in the lower extremities Neuro-alert, oriented x3, no focal deficit noted  Condition at discharge: good  The results of significant diagnostics from this hospitalization (including imaging,  microbiology, ancillary and laboratory) are listed below for reference.   Imaging Studies: DG Abd 2 Views  Result Date: 07/17/2022 CLINICAL DATA:  Ileus EXAM: ABDOMEN - 2 VIEW COMPARISON:  Radiograph 07/15/2022 FINDINGS: Diffuse gaseous distension of bowel, with some mild bowel wall thickening. Increased gaseous distention of the colon, particularly the ascending and proximal transverse colon. No acute osseous abnormality. IMPRESSION: Persistent diffuse gaseous distension of bowel, compatible with ileus, with increased distention of the colon in comparison to prior. Electronically Signed   By: Maurine Simmering M.D.   On: 07/17/2022 11:26   DG Abd Portable 1V  Result Date: 07/15/2022 CLINICAL DATA:  Nausea and vomiting.  Abdominal distention. EXAM: PORTABLE ABDOMEN - 1 VIEW COMPARISON:  July 14, 2022 FINDINGS: Dilated loops of bowel again identified. There clearly dilated loops of small bowel. There appears to be air within at least the proximal colon. No gas in the rectum. No other abnormalities. IMPRESSION: Continued  small bowel dilatation. There is also air in at least the proximal colon and a small amount of air in the distal descending colon. The findings likely represent ileus a as previously reported. Electronically Signed   By: Dorise Bullion III M.D.   On: 07/15/2022 11:21   DG Chest Port 1 View  Result Date: 07/14/2022 CLINICAL DATA:  Acute respiratory failure, possible aspiration EXAM: PORTABLE CHEST 1 VIEW COMPARISON:  07/14/2022 FINDINGS: Previously seen gastric catheter and endotracheal tube have been removed in the interval. Cardiac shadow is enlarged. Stable left basilar atelectasis is noted. No other focal abnormality is seen. IMPRESSION: Stable left basilar atelectasis. Electronically Signed   By: Inez Catalina M.D.   On: 07/14/2022 22:19   VAS [place] UPPER EXTREMITY VENOUS DUPLEX  Result Date: 07/14/2022 UPPER VENOUS STUDY  Patient Name:  CONG HIGHTOWER  Date of Exam:   07/13/2022 Medical Rec #: [id number]           Accession #:    [id number] Date of Birth: 1966/11/06            Patient Gender: M Patient Age:   73 years Exam Location:  [facility] Procedure:      VAS [place] UPPER EXTREMITY VENOUS DUPLEX Referring Phys: Remo Lipps SOMMER --------------------------------------------------------------------------------  Indications: Edema Other Indications: 2 IVs to LUE, patient in restraints, arm in dependent position. Limitations: Poor patient positioning, restraints. and line. Comparison Study: No previous exams Performing Technologist: Jody Hill RVT, RDMS  Examination Guidelines: A complete evaluation includes B-mode imaging, spectral Doppler, color Doppler, and power Doppler as needed of all accessible portions of each vessel. Bilateral testing is considered an integral part of a complete examination. Limited examinations for reoccurring indications may be performed as noted.  Right Findings: +----------+------------+---------+-----------+----------+-------+ RIGHT      CompressiblePhasicitySpontaneousPropertiesSummary +----------+------------+---------+-----------+----------+-------+ Subclavian               No        Yes                      +----------+------------+---------+-----------+----------+-------+ Subclavian vein patent by color and doppler - unable to compression due to patient position. Doppler waveform appears pulsatile.  Left Findings: +----------+------------+---------+-----------+----------+-----------------+ LEFT      CompressiblePhasicitySpontaneousProperties     Summary      +----------+------------+---------+-----------+----------+-----------------+ IJV           Full       No        Yes                                +----------+------------+---------+-----------+----------+-----------------+  Subclavian    Full       No        Yes                                +----------+------------+---------+-----------+----------+-----------------+ Axillary      Full       No        Yes                                +----------+------------+---------+-----------+----------+-----------------+ Brachial      Full       No        Yes                                +----------+------------+---------+-----------+----------+-----------------+ Radial        Full                                                    +----------+------------+---------+-----------+----------+-----------------+ Ulnar         Full                                                    +----------+------------+---------+-----------+----------+-----------------+ Cephalic    Partial      No        No               Age Indeterminate +----------+------------+---------+-----------+----------+-----------------+ Basilic       Full       No        Yes                                +----------+------------+---------+-----------+----------+-----------------+ focal SVT in cephalic vein just below IV insertion site. Ulnar not well visualized  due to patient positioning and restraints. Pulsatile doppler waveforms to LUE.  Summary:  Right: No evidence of thrombosis in the subclavian.  Left: No evidence of deep vein thrombosis in the upper extremity. Findings consistent with age indeterminate superficial vein thrombosis involving the left cephalic vein.  *See table(s) above for measurements and observations.  Diagnosing physician: Orlie Pollen Electronically signed by Orlie Pollen on 07/14/2022 at 5:44:50 PM.    Final    DG CHEST PORT 1 VIEW  Result Date: 07/14/2022 CLINICAL DATA:  [id number] and [id number]. Acute respiratory failure with hypoxia. Ileus. EXAM: PORTABLE CHEST 1 VIEW; ABDOMEN 1 VIEW OBTAINED IN 2 FILMS COMPARISON:  Abdomen films of 07/12/2022, and portable chest yesterday at 5:10 a.m. FINDINGS: Chest: 4:52 a.m. ETT tip remains 3.1 cm from the carinal angle. NGT extends well into the stomach with the tip not filmed. Stable cardiomegaly. Increased central vascular prominence noted today without evidence of overt edema. No pleural effusion is seen. Once again body habitus limits assessment of the bases as well as low lung volumes. There are basilar atelectatic bands without a convincing focal pneumonia. Grossly intact thoracic cage. Abdomen: 4:58 a.m. Diffuse gas dilatation of the small and large bowel is again noted with bowel  gas through into the rectum. Degree of dilatation is not significantly changed. The tip of the NGT is either in the most distal gastric antrum or duodenal bulb. There is no supine evidence of free air. Visceral shadows appear generally stable as far as seen, with limited subject contrast to body habitus. IMPRESSION: Increased central vascular prominence without overt edema. Stable cardiomegaly. Basilar atelectatic bands with low lung volumes. No convincing acute infiltrate. Stable diffuse gaseous dilatation of the bowel. Electronically Signed   By: Telford Nab M.D.   On: 07/14/2022 05:33   DG Abd 1 View  Result Date:  07/14/2022 CLINICAL DATA:  [id number] and [phone]. Acute respiratory failure with hypoxia. Ileus. EXAM: PORTABLE CHEST 1 VIEW; ABDOMEN 1 VIEW OBTAINED IN 2 FILMS COMPARISON:  Abdomen films of 07/12/2022, and portable chest yesterday at 5:10 a.m. FINDINGS: Chest: 4:52 a.m. ETT tip remains 3.1 cm from the carinal angle. NGT extends well into the stomach with the tip not filmed. Stable cardiomegaly. Increased central vascular prominence noted today without evidence of overt edema. No pleural effusion is seen. Once again body habitus limits assessment of the bases as well as low lung volumes. There are basilar atelectatic bands without a convincing focal pneumonia. Grossly intact thoracic cage. Abdomen: 4:58 a.m. Diffuse gas dilatation of the small and large bowel is again noted with bowel gas through into the rectum. Degree of dilatation is not significantly changed. The tip of the NGT is either in the most distal gastric antrum or duodenal bulb. There is no supine evidence of free air. Visceral shadows appear generally stable as far as seen, with limited subject contrast to body habitus. IMPRESSION: Increased central vascular prominence without overt edema. Stable cardiomegaly. Basilar atelectatic bands with low lung volumes. No convincing acute infiltrate. Stable diffuse gaseous dilatation of the bowel. Electronically Signed   By: Telford Nab M.D.   On: 07/14/2022 05:33   DG CHEST PORT 1 VIEW  Result Date: 07/13/2022 CLINICAL DATA:  [id number]. Bedside accounting for hypoxia. Ventilator dependent respiratory failure. EXAM: PORTABLE CHEST 1 VIEW COMPARISON:  Portable 07/10/2022 FINDINGS: 5:10 a.m. ETT tip is 3.3 cm from carina. NGT extends into the gastric antrum but the proximal side hole and tip are not seen. Cardiomegaly. The central vascular markings are normal caliber. The lungs are generally clear with limited view of the bases due to body habitus. Stable mediastinal configuration IMPRESSION: Stable chest with  cardiomegaly. The lungs are generally clear but there is limited visualization of the bases due to body habitus. Support tubes as above. Electronically Signed   By: Telford Nab M.D.   On: 07/13/2022 07:27   DG Abd Portable 1V  Result Date: 07/12/2022 CLINICAL DATA:  Ileus EXAM: PORTABLE ABDOMEN - 1 VIEW COMPARISON:  07/11/2022 FINDINGS: Diffuse gaseous distention of bowel again noted, not significantly changed since prior study. NG tube is in place with the tip in the distal stomach. No free air organomegaly. IMPRESSION: Continued diffuse gaseous distention of bowel compatible with ileus, unchanged. Electronically Signed   By: Rolm Baptise M.D.   On: 07/12/2022 23:33   DG [place] Dys Tube Plc W/Fl W/Rad  Result Date: 07/12/2022 CLINICAL DATA:  Encounter for NG tube placement EXAM: NASO G TUBE PLACEMENT WITH FL AND WITH RAD CONTRAST:  None FLUOROSCOPY: Fluoroscopy Time:  1 minute 0 seconds Radiation Exposure Index (if provided by the fluoroscopic device): 15.5 mGy Number of Acquired Spot Images: 2 fluoroscopic images COMPARISON:  Multiple recent abdominal radiograph. FINDINGS: The patient was placed supine  on the fluoroscopy table. Initial fluoroscopic image of the abdomen demonstrates gaseous distention of bowel. A 16 Fr nasogastric tube was lubricated and placed into the patient's left nostril. Under fluoroscopy, the nasogastric tube was advanced through the esophagus and into the stomach. There was resistance at the level of the distal esophagus. With gentle rotation and pressure, the tube was successfully advanced into the stomach and there was immediate return of gastric contents through the tube. The tube was taped in place and an image saved demonstrating the nasogastric tube tip and side port overlying the stomach. The tube is ready for immediate use. IMPRESSION: Successful 16 Fr nasogastric tube placement into the stomach. Electronically Signed   By: Maurine Simmering M.D.   On: 07/12/2022 12:50   DG Abd 1  View  Result Date: 07/11/2022 CLINICAL DATA:  NG tube placement. EXAM: ABDOMEN - 1 VIEW COMPARISON:  Earlier film, same date. FINDINGS: The NG tube is not observed on this radiograph. Persistent bowel distention. No free air. IMPRESSION: 1. NG tube is not observed on this radiograph. 2. Persistent bowel distention. Electronically Signed   By: Marijo Sanes M.D.   On: 07/11/2022 17:16   DG Abd 1 View  Result Date: 07/11/2022 CLINICAL DATA:  Orogastric placement EXAM: ABDOMEN - 1 VIEW COMPARISON:  07/11/2022 FINDINGS: Orogastric tube tip is only in the distal esophagus, approximately 10 cm proximal to the stomach. IMPRESSION: Orogastric tube tip only in the distal esophagus, approximately 10 cm proximal to the stomach. Electronically Signed   By: Nelson Chimes M.D.   On: 07/11/2022 14:27   DG Abd 1 View  Result Date: 07/11/2022 CLINICAL DATA:  56 year old male, assess NG tube placement. EXAM: ABDOMEN - 1 VIEW COMPARISON:  Earlier the same day FINDINGS: Interval advancement of gastric decompression tube, now with the distal tip and proximal side hole coiled within the gastric body. No evidence of significant gastric distension. Similar appearing multifocal loops of small and large bowel which demonstrate gaseous distension. IMPRESSION: 1. Interval placement of gastric decompression tube within the stomach. 2. Similar appearing ileus. Electronically Signed   By: Ruthann Cancer M.D.   On: 07/11/2022 09:06   DG Abd Portable 2V  Result Date: 07/11/2022 CLINICAL DATA:  Abdominal distention. EXAM: PORTABLE ABDOMEN - 2 VIEW COMPARISON:  KUB 07/10/2022; CT abdomen and pelvis without contrast 07/09/2022 FINDINGS: There are again dilated loops of air-filled small and large bowel. Left lower quadrant small bowel distention up to 5.5 cm is similar to prior. Air is again seen within the ascending and transverse colon. There are air-fluid levels within the small and large bowel. No free air is seen on the right side up  decubitus views. No portal venous gas or pneumatosis is seen. IMPRESSION: Air-filled loops of distended small and large bowel are similar to prior. This is again consistent with postoperative ileus, as suggested on prior CT. Electronically Signed   By: Yvonne Kendall M.D.   On: 07/11/2022 08:13   Portable Chest x-ray  Result Date: 07/10/2022 CLINICAL DATA:  Endotracheal tube placement EXAM: PORTABLE CHEST 1 VIEW COMPARISON:  07/10/2022 FINDINGS: Endotracheal tube tip 3 cm above the carina. Orogastric or nasogastric tube enters the stomach. Cardiomegaly and tortuous aorta as seen previously. Lungs are clear. IMPRESSION: Endotracheal tube tip 3 cm above the carina. Electronically Signed   By: Nelson Chimes M.D.   On: 07/10/2022 19:25   DG Abd Portable 1V  Result Date: 07/10/2022 CLINICAL DATA:  Recent knee surgery, abdominal distension EXAM: PORTABLE  ABDOMEN - 1 VIEW COMPARISON:  07/09/2022 FINDINGS: 2 supine frontal views of the abdomen and pelvis excludes the right flank by collimation. Enteric catheter tip projects at the gastric cardia. There is diffuse gaseous distension of the large and small bowel not appreciably changed, most consistent with postoperative ileus. No masses or abnormal calcifications. IMPRESSION: 1. Continued diffuse gaseous distension of large and small bowel, consistent with postoperative ileus. Electronically Signed   By: Randa Ngo M.D.   On: 07/10/2022 18:06   DG CHEST PORT 1 VIEW  Result Date: 07/10/2022 CLINICAL DATA:  Recent knee surgery, hypoxia, abdominal distension EXAM: PORTABLE CHEST 1 VIEW COMPARISON:  07/09/2022 FINDINGS: Frontal view of the chest demonstrates enteric catheter passing below diaphragm, tip coiled over the gastric cardia. Cardiac silhouette remains enlarged. Lung volumes are diminished, with bibasilar consolidation most consistent with atelectasis. No effusion or pneumothorax. Continued gaseous distention of the colon. IMPRESSION: 1. Stable  hypoventilatory changes. 2. Enteric catheter tip projecting over the gastric cardia. Electronically Signed   By: Randa Ngo M.D.   On: 07/10/2022 18:05   VAS [place] LOWER EXTREMITY VENOUS (DVT)  Result Date: 07/10/2022  Lower Venous DVT Study Patient Name:  DELOREAN KNUTZEN  Date of Exam:   07/10/2022 Medical Rec #: [id number]           Accession #:    [id number] Date of Birth: 01/24/1966            Patient Gender: M Patient Age:   89 years Exam [facility]:  [facility] Procedure:      VAS [place] LOWER EXTREMITY VENOUS (DVT) Referring Phys: Bonnielee Haff --------------------------------------------------------------------------------  Indications: Swelling.  Risk Factors: Surgery. Limitations: Body habitus, poor ultrasound/tissue interface, bandages and open wound. Comparison Study: No prior studies. Performing Technologist: Oliver Hum RVT  Examination Guidelines: A complete evaluation includes B-mode imaging, spectral Doppler, color Doppler, and power Doppler as needed of all accessible portions of each vessel. Bilateral testing is considered an integral part of a complete examination. Limited examinations for reoccurring indications may be performed as noted. The reflux portion of the exam is performed with the patient in reverse Trendelenburg.  +---------+---------------+---------+-----------+----------+-------------------+ RIGHT    CompressibilityPhasicitySpontaneityPropertiesThrombus Aging      +---------+---------------+---------+-----------+----------+-------------------+ CFV      Full           Yes      Yes                                      +---------+---------------+---------+-----------+----------+-------------------+ SFJ      Full                                                             +---------+---------------+---------+-----------+----------+-------------------+ FV Prox  Full                                                              +---------+---------------+---------+-----------+----------+-------------------+ FV Mid   Full                                                             +---------+---------------+---------+-----------+----------+-------------------+  FV DistalFull                                                             +---------+---------------+---------+-----------+----------+-------------------+ PFV      Full                                                             +---------+---------------+---------+-----------+----------+-------------------+ POP      Full           Yes      Yes                                      +---------+---------------+---------+-----------+----------+-------------------+ PTV      Full                                                             +---------+---------------+---------+-----------+----------+-------------------+ PERO                                                  Not well visualized +---------+---------------+---------+-----------+----------+-------------------+   +---------+---------------+---------+-----------+----------+-------------------+ LEFT     CompressibilityPhasicitySpontaneityPropertiesThrombus Aging      +---------+---------------+---------+-----------+----------+-------------------+ CFV      Full           Yes      Yes                                      +---------+---------------+---------+-----------+----------+-------------------+ SFJ      Full                                                             +---------+---------------+---------+-----------+----------+-------------------+ FV Prox  Full                                                             +---------+---------------+---------+-----------+----------+-------------------+ FV Mid                  Yes      Yes                                      +---------+---------------+---------+-----------+----------+-------------------+ FV  Distal  Yes      Yes                                      +---------+---------------+---------+-----------+----------+-------------------+ PFV      Full                                                             +---------+---------------+---------+-----------+----------+-------------------+ POP      Full           Yes      Yes                                      +---------+---------------+---------+-----------+----------+-------------------+ PTV      Full                                                             +---------+---------------+---------+-----------+----------+-------------------+ PERO                                                  Not well visualized +---------+---------------+---------+-----------+----------+-------------------+     Summary: RIGHT: - There is no evidence of deep vein thrombosis in the lower extremity. However, portions of this examination were limited- see technologist comments above.  - No cystic structure found in the popliteal fossa.  LEFT: - There is no evidence of deep vein thrombosis in the lower extremity. However, portions of this examination were limited- see technologist comments above.  - No cystic structure found in the popliteal fossa.  *See table(s) above for measurements and observations. Electronically signed by Servando Snare MD on 07/10/2022 at 1:35:09 PM.    Final    NM Pulmonary Perf and Vent  Result Date: 07/10/2022 CLINICAL DATA:  Positive D-dimer. Pulmonary embolism suspected. Status post knee surgery 03/23/2022. Shortness of breath following surgery. EXAM: NUCLEAR MEDICINE PERFUSION LUNG SCAN TECHNIQUE: Perfusion images were obtained in multiple projections after intravenous injection of radiopharmaceutical. Ventilation scans intentionally deferred if perfusion scan and chest x-ray adequate for interpretation during COVID 19 epidemic. RADIOPHARMACEUTICALS:  4.36 mCi Tc-71mMAA IV COMPARISON:  None Available;  correlation is made with AP chest 07/09/2022 chest two views 12/12/2021. FINDINGS: There is homogeneous distribution of perfusion radiotracer throughout the bilateral lungs. No segmental perfusion defect is seen. IMPRESSION: Very low probability perfusion scintigraphy scan (PE absent). Electronically Signed   By: RYvonne KendallM.D.   On: 07/10/2022 12:11   CT ABDOMEN PELVIS WO CONTRAST  Result Date: 07/09/2022 CLINICAL DATA:  Abdominal bloating, recent knee surgery 07/07/2022, abdominal tenderness, nausea EXAM: CT ABDOMEN AND PELVIS WITHOUT CONTRAST TECHNIQUE: Multidetector CT imaging of the abdomen and pelvis was performed following the standard protocol without IV contrast. RADIATION DOSE REDUCTION: This exam was performed according to the departmental dose-optimization program which includes automated exposure control, adjustment of the mA and/or kV according to  patient size and/or use of iterative reconstruction technique. COMPARISON:  07/09/2022, 08/17/2019 FINDINGS: Lower chest: Hypoventilatory changes are seen at the lung bases. No acute pleural or parenchymal lung disease. Hepatobiliary: Unremarkable unenhanced appearance of the liver and gallbladder. Pancreas: Unremarkable unenhanced appearance. Spleen: Unremarkable unenhanced appearance. Adrenals/Urinary Tract: No urinary tract calculi or obstructive uropathy within either kidney. The bladder is decompressed, limiting its evaluation. The adrenals are unremarkable. Stomach/Bowel: There is gaseous distension of the large and small bowel to the level of the splenic flexure of the colon, with multiple gas fluid levels. Maximal diameter of the mid jejunum measures approximate 4.5 cm. Overall, I would favor postoperative ileus given recent orthopedic surgery. No bowel wall thickening or inflammatory change. The appendix is surgically absent. Small hiatal hernia. Vascular/Lymphatic: No significant vascular findings are present. No enlarged abdominal or pelvic  lymph nodes. Reproductive: Prostate is unremarkable. Other: No free intraperitoneal fluid or free gas. No abdominal wall hernia. Musculoskeletal: There are no acute or destructive bony lesions. Intramuscular and soft tissue edema within the proximal left lower extremity may be related to recent left knee surgery. If there is concern for underlying DVT, Doppler ultrasound could be performed. Reconstructed images demonstrate no additional findings. IMPRESSION: 1. Diffuse gaseous distension of the large and small bowel with numerous gas fluid levels, most compatible with postoperative ileus. Continued radiographic follow-up is recommended. 2. Intramuscular and soft tissue edema within the visualized left lower extremity, which could be related to recent orthopedic surgery. If underlying DVT is suspected, Doppler ultrasound could be performed. 3. Small hiatal hernia. Electronically Signed   By: Randa Ngo M.D.   On: 07/09/2022 22:33   DG CHEST PORT 1 VIEW  Result Date: 07/09/2022 CLINICAL DATA:  Shortness of breath EXAM: PORTABLE CHEST 1 VIEW COMPARISON:  12/12/2021 FINDINGS: Low lung volumes. Bibasilar atelectasis. Heart is normal size. No effusions or acute bony abnormality. IMPRESSION: Low lung volumes with bibasilar atelectasis. Electronically Signed   By: Rolm Baptise M.D.   On: 07/09/2022 18:17   DG Abd Portable 1V  Result Date: 07/09/2022 CLINICAL DATA:  Shortness of breath EXAM: PORTABLE ABDOMEN - 1 VIEW COMPARISON:  None Available. FINDINGS: Dilated bowel appears to mostly represent small-bowel although right colon may also may be dilated. No organomegaly or free air. No visible suspicious calcification. IMPRESSION: Dilated bowel which I favor is a combination of small bowel and right colon. This could reflect ileus although colonic obstruction cannot be excluded. Electronically Signed   By: Rolm Baptise M.D.   On: 07/09/2022 18:16   DG Knee Left Port  Result Date: 07/07/2022 CLINICAL DATA:  Status  post total left knee arthroplasty. EXAM: PORTABLE LEFT KNEE - 1-2 VIEW COMPARISON:  Left knee radiographs 04/25/2022 FINDINGS: Interval total left knee arthroplasty. No perihardware lucency is seen to indicate hardware failure or loosening. Moderate irregularity of the medial aspect of the medial tibial plateau at the level of the metaphysis is unchanged from prior and likely represents degenerative osteophytosis. Expected postoperative changes including anterior soft tissue swelling and subcutaneous air. Mild intra-articular fluid and air. Anterior surgical skin staples. No acute fracture or dislocation. IMPRESSION: Status post total left knee arthroplasty without evidence of hardware failure. Electronically Signed   By: Yvonne Kendall M.D.   On: 07/07/2022 10:40   XR Lumbar Spine 2-3 Views  Result Date: 06/28/2022 2 views of the lumbar spine show significant degenerative disc disease and facet joint arthritis between L4-L5 and L5-S1.   Microbiology: Results for orders placed or performed  during the hospital encounter of 07/07/22  MRSA Next Gen by PCR, Nasal     Status: None   Collection Time: 07/10/22  6:02 PM   Specimen: Nasal Mucosa; Nasal Swab  Result Value Ref Range Status   MRSA by PCR Next Gen NOT DETECTED NOT DETECTED Final    Comment: (NOTE) The GeneXpert MRSA Assay (FDA approved for NASAL specimens only), is one component of a comprehensive MRSA colonization surveillance program. It is not intended to diagnose MRSA infection nor to guide or monitor treatment for MRSA infections. Test performance is not FDA approved in patients less than 59 years old. Performed at [facility], [place] [address], [place], [place] [address]   Culture, blood (Routine X 2) w Reflex to ID Panel     Status: None   Collection Time: 07/11/22  7:50 AM   Specimen: BLOOD  Result Value Ref Range Status   Specimen Description   Final    BLOOD BLOOD RIGHT FOREARM Performed at [facility] [address] [place], [place] [address]    Special Requests   Final    IN PEDIATRIC BOTTLE Blood Culture adequate volume Performed at [facility] [address], [place], [place] [address]    Culture   Final    NO GROWTH 5 DAYS Performed at [facility], [place] [address] [place], [place] [address]    Report Status 07/16/2022 FINAL  Final  Culture, Respiratory w Gram Stain     Status: None   Collection Time: 07/11/22  7:55 AM   Specimen: Tracheal Aspirate; Respiratory  Result Value Ref Range Status   Specimen Description   Final    TRACHEAL ASPIRATE Performed at [facility] [address], [place], [place] [address]    Special Requests   Final    NONE Performed at [facility], [place] [address] [place], [place] [address]    Gram Stain   Final    FEW WBC PRESENT, PREDOMINANTLY MONONUCLEAR FEW GRAM POSITIVE COCCI IN CLUSTERS MODERATE GRAM POSITIVE COCCI IN PAIRS AND CHAINS FEW GRAM NEGATIVE RODS    Culture   Final    MODERATE Normal respiratory flora-no Staph aureus or Pseudomonas seen Performed at [facility], [place] [address] [place], [place] [address]    Report Status 07/13/2022 FINAL  Final  Culture, blood (Routine X 2) w Reflex to ID Panel     Status: None   Collection Time: 07/11/22  6:44 PM   Specimen: BLOOD  Result Value Ref Range Status   Specimen Description   Final    BLOOD BLOOD RIGHT FOREARM Performed at [facility] [address], [place], [place] [address]    Special Requests   Final    IN PEDIATRIC BOTTLE Blood Culture adequate volume Performed at [facility] [address], [place], [place] [address]    Culture   Final    NO GROWTH 5 DAYS Performed at [facility], [place] [address], [place], [place] [address]    Report Status 07/16/2022 FINAL  Final    Labs: CBC: Recent Labs  Lab 07/15/22 0251 07/16/22 0309  07/17/22 0255 07/18/22 0331 07/19/22 0905  WBC 12.8* 14.6* 14.0* 10.7* 14.5*  HGB 9.9* 11.3* 11.2* 10.7* 9.9*  HCT 29.9* 33.6* 33.6* 32.7* 30.1*  MCV 104.9* 101.8* 105.7* 105.8* 103.4*  PLT 356 388 332 366 163   Basic Metabolic Panel: Recent Labs  Lab 07/14/22 0718 07/15/22 0251 07/15/22 4166 07/15/22 0929 07/16/22 0309 07/17/22 0255 07/18/22 0331 07/19/22 0905  NA 137 141 116* 144 141 136 137 137  K 3.6 3.4* 2.6* 2.9* 3.3* 3.2* 3.9 3.7  CL 107 107 86* 114* 106 102 103 100  CO2 20* 20* 17* 18* '23 26 24 27  '$ GLUCOSE 127* 141* 128* 148* 161* 151* 141* 153*  BUN 25* '17 14 15 16 14 14 12  '$ CREATININE 1.07 1.04 0.80 0.86 0.81 0.80 0.95 0.88  CALCIUM 8.4* 8.7* 7.0* 6.9* 8.9 8.3* 8.7* 8.3*  MG 2.1 1.8  --   --  2.1 1.6* 2.1  --   PHOS 1.6* 1.9* 1.5*  --   --   --  2.8  --    Liver Function Tests: Recent Labs  Lab 07/14/22 0718 07/16/22 0309 07/17/22 0255  AST 33 49* 75*  ALT 21 31 46*  ALKPHOS 82 93 100  BILITOT 1.1 1.0 1.1  PROT 6.3* 6.9 6.6  ALBUMIN 2.5* 2.9* 2.5*   CBG: Recent Labs  Lab 07/19/22 1217 07/19/22 1811 07/19/22 2045 07/20/22 0754 07/20/22 1103  GLUCAP 144* 147* 114* 116* 120*    Discharge time spent: greater than 30 minutes.  Signed: Oswald Hillock, MD [facility] 07/20/2022

## 2022-07-20 NOTE — Progress Notes (Signed)
Physical Therapy Treatment Patient Details Name: Randy Combs MRN: [id number] DOB: 18-Apr-1966 Today's Date: 07/20/2022   History of Present Illness 56 yo male S/P LTKA 07/07/22; 8/5 pt developed post op ileus with abdominal distention; 8/6 NG tube placed; 8/7 Pt to ICU, Precedex initiated, pt intubated 2* respiratory failure; 8/11 pt extubated PMH: gout, DM, glaucoma.    PT Comments    POD # 13 am session MUCH IMPROVED.  Pt eager to D/C to home today.  Pt self able to rise from recliner/bed level as well as amb a functional distance.  Pt safely navigating stairs.  Pain after activity was 7/10.  Pain meds requested.  Applied [employer] and will return shortly later to [employer].    Recommendations for follow up therapy are one component of a multi-disciplinary discharge planning process, led by the attending physician.  Recommendations may be updated based on patient status, additional functional criteria and insurance authorization.  Follow Up Recommendations  Home health PT Can patient physically be transported by private vehicle: Yes   Assistance Recommended at Discharge Frequent or constant Supervision/Assistance  Patient can return home with the following Two people to help with walking and/or transfers;Two people to help with bathing/dressing/bathroom;Assist for transportation;Help with stairs or ramp for entrance;Assistance with cooking/housework   Equipment Recommendations  None recommended by PT    Recommendations for Other Services       Precautions / Restrictions Precautions Precautions: Fall;Knee Precaution Comments: reviewed no pillow under knee Restrictions Weight Bearing Restrictions: No LLE Weight Bearing: Weight bearing as tolerated     Mobility  Bed Mobility               General bed mobility comments: OOB in recliner    Transfers Overall transfer level: Needs assistance Equipment used: Rolling walker (2 wheels) Transfers:  Sit to/from Stand Sit to Stand: Supervision           General transfer comment: pt self able to rise using forward momentum and good use of hands.  Much improved.    Ambulation/Gait Ambulation/Gait assistance: Supervision Gait Distance (Feet): 115 Feet Assistive device: Rolling walker (2 wheels) Gait Pattern/deviations: Step-to pattern, Decreased stance time - left, Antalgic, Knee flexed in stance - left Gait velocity: decreased     General Gait Details: much improved amb in hallway with regular size walker   Stairs Stairs: Yes Stairs assistance: Supervision, Min guard Stair Management: Two rails, Forwards Number of Stairs: 2 General stair comments: 25% VC's on proper sequencing as well as safety.  Pt was able to navigate 2 steps safely.   Wheelchair Mobility    Modified Rankin (Stroke Patients Only)       Balance                                            Cognition Arousal/Alertness: Awake/alert Behavior During Therapy: WFL for tasks assessed/performed, Anxious Overall Cognitive Status: Within Functional Limits for tasks assessed                                 General Comments: AxO x 3 very pleasant and cooperative.        Exercises      General Comments        Pertinent Vitals/Pain Pain Assessment Pain Assessment: 0-10 Pain Score: 7  Pain  Descriptors / Indicators: Operative site guarding Pain Intervention(s): Monitored during session, Repositioned, Patient requesting pain meds-RN notified, Ice applied    Home Living                          Prior Function            PT Goals (current goals can now be found in the care plan section) Progress towards PT goals: Progressing toward goals    Frequency    7X/week      PT Plan Current plan remains appropriate    Co-evaluation              AM-PAC PT "6 Clicks" Mobility   Outcome Measure  Help needed turning from your back to your side  while in a flat bed without using bedrails?: A Little Help needed moving from lying on your back to sitting on the side of a flat bed without using bedrails?: A Little Help needed moving to and from a bed to a chair (including a wheelchair)?: A Little Help needed standing up from a chair using your arms (e.g., wheelchair or bedside chair)?: A Little Help needed to walk in hospital room?: A Little Help needed climbing 3-5 steps with a railing? : A Little 6 Click Score: 18    End of Session Equipment Utilized During Treatment: Gait belt Activity Tolerance: Patient tolerated treatment well Patient left: in chair;with chair alarm set;with call bell/phone within reach Nurse Communication: Mobility status PT Visit Diagnosis: Difficulty in walking, not elsewhere classified (R26.2);Unsteadiness on feet (R26.81);Pain Pain - Right/Left: Left Pain - part of body: Knee     Time: 2505-3976 PT Time Calculation (min) (ACUTE ONLY): 25 min  Charges:  $Gait Training: 8-22 mins $Therapeutic Activity: 8-22 mins                     Rica Koyanagi  PTA Acute  Rehabilitation Services Office M-F          [phone] Weekend pager [phone]

## 2022-07-20 NOTE — TOC Transition Note (Signed)
Transition of Care [facility]) - CM/SW Discharge Note   Patient Details  Name: Randy Combs MRN: [id number] Date of Birth: 05-25-1966  [facility] of [facility] CM/SW Contact:  Lennart Pall, LCSW Phone Number: 07/20/2022, 10:26 AM   Clinical Narrative:    Pt has continued to make excellent progress and now back to original plan for home dc and HHPT follow up with [facility].  Pt very happy about his improvements and feeling very comfortable with dc home today.  RW has already been delivered to room.  SA resources placed on AVS.  No further TOC needs.   Final next level of care: [place] Barriers to Discharge: Barriers Resolved   Patient Goals and CMS Choice Patient states their goals for this hospitalization and ongoing recovery are:: To return back home with home health PT. CMS Medicare.gov Compare Post Acute Care list provided to:: Patient Choice offered to / list presented to : Patient  Discharge Placement                       Discharge Plan and Services                DME Arranged: Walker rolling DME Agency: [employer] Date DME Agency Contacted: 07/09/22 Time DME Agency Contacted: 1202 Representative spoke with at DME Agency: [place]: PT [place]: [place] ([place]) Interventions     Readmission Risk Interventions    07/20/2022   10:25 AM  Readmission Risk Prevention Plan  Transportation Screening Complete  PCP or Specialist Appt within 5-7 Days Complete  Home Care Screening Complete  Medication Review (RN CM) Complete

## 2022-07-20 NOTE — Progress Notes (Signed)
Physical Therapy Treatment Patient Details Name: Randy Combs MRN: [id number] DOB: 19-Sep-1966 Today's Date: 07/20/2022   History of Present Illness 56 yo male S/P LTKA 07/07/22; 8/5 pt developed post op ileus with abdominal distention; 8/6 NG tube placed; 8/7 Pt to ICU, Precedex initiated, pt intubated 2* respiratory failure; 8/11 pt extubated PMH: gout, DM, glaucoma.    PT Comments    POD # 13 pm session Performed all TKR TE's following HEP handout with instructions on proper tech and frequency.  Educated on [employer] use and application.  Educated on proper car transfer tech.  Discussed how to get in/out tub shower.  Pt stated he has a long bench where he can sit while legs are outside then swing them over.   Addressed all mobility questions, discussed appropriate activity, educated on use of ICE.  Pt ready for D/C to home.   Recommendations for follow up therapy are one component of a multi-disciplinary discharge planning process, led by the attending physician.  Recommendations may be updated based on patient status, additional functional criteria and insurance authorization.  Follow Up Recommendations  Home health PT Can patient physically be transported by private vehicle: Yes   Assistance Recommended at Discharge Frequent or constant Supervision/Assistance  Patient can return home with the following Two people to help with walking and/or transfers;Two people to help with bathing/dressing/bathroom;Assist for transportation;Help with stairs or ramp for entrance;Assistance with cooking/housework   Equipment Recommendations  None recommended by PT    Recommendations for Other Services       Precautions / Restrictions Precautions Precautions: Fall;Knee Precaution Comments: reviewed no pillow under knee Restrictions Weight Bearing Restrictions: No LLE Weight Bearing: Weight bearing as tolerated        Balance                                             Cognition Arousal/Alertness: Awake/alert Behavior During Therapy: WFL for tasks assessed/performed, Anxious Overall Cognitive Status: Within Functional Limits for tasks assessed                                 General Comments: AxO x 3 very pleasant and cooperative.        Exercises   Total Knee Replacement TE's following HEP handout 10 reps B LE ankle pumps 05 reps towel squeezes 05 reps knee presses 05 reps heel slides  05 reps SAQ's 05 reps SLR's 05 reps ABD 05 reps seated knee bends 05 reps assisted knee bends 05 reps LAQ's  Educated on use of gait belt to assist with TE's Followed by ICE    General Comments        Pertinent Vitals/Pain Pain Assessment Pain Assessment: 0-10 Pain Score: 7  Pain Descriptors / Indicators: Operative site guarding Pain Intervention(s): Monitored during session, Repositioned, Patient requesting pain meds-RN notified, Ice applied    Home Living                          Prior Function            PT Goals (current goals can now be found in the care plan section) Progress towards PT goals: Progressing toward goals    Frequency    7X/week      PT Plan Current plan remains  appropriate    Co-evaluation              AM-PAC PT "6 Clicks" Mobility   Outcome Measure  Help needed turning from your back to your side while in a flat bed without using bedrails?: A Little Help needed moving from lying on your back to sitting on the side of a flat bed without using bedrails?: A Little Help needed moving to and from a bed to a chair (including a wheelchair)?: A Little Help needed standing up from a chair using your arms (e.g., wheelchair or bedside chair)?: A Little Help needed to walk in hospital room?: A Little Help needed climbing 3-5 steps with a railing? : A Little 6 Click Score: 18    End of Session Equipment Utilized During Treatment: Gait belt Activity Tolerance: Patient tolerated treatment  well Patient left: in chair;with chair alarm set;with call bell/phone within reach Nurse Communication: Mobility status PT Visit Diagnosis: Difficulty in walking, not elsewhere classified (R26.2);Unsteadiness on feet (R26.81);Pain Pain - Right/Left: Left Pain - part of body: Knee     Time: 5848-3507 PT Time Calculation (min) (ACUTE ONLY): 25 min  Charges:  $Therapeutic Exercise: 8-22 mins $Self Care/Home Management: [place]  PTA Acute  Rehabilitation Services Office M-F          [phone] Weekend pager [phone]

## 2022-07-20 NOTE — Plan of Care (Signed)
Problem: Education: Goal: Knowledge of General Education information will improve Description: Including pain rating scale, medication(s)/side effects and non-pharmacologic comfort measures Outcome: Adequate for Discharge   Problem: Health Behavior/Discharge Planning: Goal: Ability to manage health-related needs will improve Outcome: Adequate for Discharge   Problem: Clinical Measurements: Goal: Ability to maintain clinical measurements within normal limits will improve Outcome: Adequate for Discharge Goal: Will remain free from infection Outcome: Adequate for Discharge Goal: Diagnostic test results will improve Outcome: Adequate for Discharge Goal: Respiratory complications will improve Outcome: Adequate for Discharge Goal: Cardiovascular complication will be avoided Outcome: Adequate for Discharge   Problem: Activity: Goal: Risk for activity intolerance will decrease Outcome: Adequate for Discharge   Problem: Nutrition: Goal: Adequate nutrition will be maintained Outcome: Adequate for Discharge   Problem: Coping: Goal: Level of anxiety will decrease Outcome: Adequate for Discharge   Problem: Elimination: Goal: Will not experience complications related to bowel motility Outcome: Adequate for Discharge Goal: Will not experience complications related to urinary retention Outcome: Adequate for Discharge   Problem: Pain Managment: Goal: General experience of comfort will improve Outcome: Adequate for Discharge   Problem: Safety: Goal: Ability to remain free from injury will improve Outcome: Adequate for Discharge   Problem: Skin Integrity: Goal: Risk for impaired skin integrity will decrease Outcome: Adequate for Discharge   Problem: Acute Rehab PT Goals(only PT should resolve) Goal: Pt Will Go Supine/Side To Sit Outcome: Adequate for Discharge Goal: Pt Will Go Sit To Supine/Side Outcome: Adequate for Discharge Goal: Patient Will Transfer Sit To/From  Stand Outcome: Adequate for Discharge Goal: Pt Will Ambulate Outcome: Adequate for Discharge Goal: Pt Will Go Up/Down Stairs Outcome: Adequate for Discharge Goal: Pt/caregiver will Perform Home Exercise Program Outcome: Adequate for Discharge   Problem: Safety: Goal: Non-violent Restraint(s) Outcome: Adequate for Discharge   Problem: Activity: Goal: Ability to tolerate increased activity will improve Outcome: Adequate for Discharge   Problem: Respiratory: Goal: Ability to maintain a clear airway and adequate ventilation will improve Outcome: Adequate for Discharge   Problem: Role Relationship: Goal: Method of communication will improve Outcome: Adequate for Discharge   Problem: Education: Goal: Ability to describe self-care measures that may prevent or decrease complications (Diabetes Survival Skills Education) will improve Outcome: Adequate for Discharge Goal: Individualized Educational Video(s) Outcome: Adequate for Discharge   Problem: Coping: Goal: Ability to adjust to condition or change in health will improve Outcome: Adequate for Discharge   Problem: Fluid Volume: Goal: Ability to maintain a balanced intake and output will improve Outcome: Adequate for Discharge   Problem: Health Behavior/Discharge Planning: Goal: Ability to identify and utilize available resources and services will improve Outcome: Adequate for Discharge Goal: Ability to manage health-related needs will improve Outcome: Adequate for Discharge   Problem: Metabolic: Goal: Ability to maintain appropriate glucose levels will improve Outcome: Adequate for Discharge   Problem: Nutritional: Goal: Maintenance of adequate nutrition will improve Outcome: Adequate for Discharge Goal: Progress toward achieving an optimal weight will improve Outcome: Adequate for Discharge   Problem: Skin Integrity: Goal: Risk for impaired skin integrity will decrease Outcome: Adequate for Discharge   Problem:  Tissue Perfusion: Goal: Adequacy of tissue perfusion will improve Outcome: Adequate for Discharge   Problem: Acute Rehab OT Goals (only OT should resolve) Goal: Pt. Will Perform Lower Body Bathing Outcome: Adequate for Discharge Goal: Pt. Will Perform Lower Body Dressing Outcome: Adequate for Discharge Goal: Pt. Will Transfer To Toilet Outcome: Adequate for Discharge Goal: Pt. Will Perform Toileting-Clothing Manipulation Outcome: Adequate for Discharge  Goal: Pt/Caregiver Will Perform Home Exercise Program Outcome: Adequate for Discharge Goal: OT Additional ADL Goal #1 Outcome: Adequate for Discharge   Problem: Increased Nutrient Needs (NI-5.1) Goal: Food and/or nutrient delivery Description: Individualized approach for food/nutrient provision. Outcome: Adequate for Discharge

## 2022-07-20 NOTE — Progress Notes (Signed)
Patient ID: Randy Combs, male   DOB: [birth year], 56 y.o.   MRN: [id number]  Mr. Hemmer continues to make excellent progress.  He is up and ambulating in the room this morning and going to the bathroom.  He feels like he is about ready to be able to be discharged home.  From a total knee standpoint, I think that that is fine as long as physical therapy has cleared him as well.  Also he needs to be medically cleared for going to home.  His vital signs are stable.  He is operative knee is also stable and I let him know that I would leave the staples in and can see him in the office next week to have the staples removed.  Hopefully he could potentially be discharged home by this afternoon if things are going well.

## 2022-07-27 ENCOUNTER — Ambulatory Visit: Payer: 59 | Admitting: Orthopaedic Surgery

## 2022-07-27 DIAGNOSIS — Z96652 Presence of left artificial knee joint: Secondary | ICD-10-CM

## 2022-07-27 NOTE — Progress Notes (Signed)
The patient is over 2 weeks status post a left total knee arthroplasty.  His postoperative course was complicated by him going into DTs after alcohol withdrawal.  He was intubated in the ICU for a while.  By the time he was discharged he had improved significantly.  He has stopped his alcohol use and overall he looks good.  Examination of his left knee shows the incision is healed.  The staples were removed and Steri-Strips applied.  His calf is soft.  He actually has pretty decent range of motion of the knee all things considered.  We will transition him to outpatient physical therapy.  I would like to see him back in 4 weeks to see how he is doing overall but no x-rays are needed.  All questions and concerns were answered and addressed.

## 2022-07-31 ENCOUNTER — Ambulatory Visit (INDEPENDENT_AMBULATORY_CARE_PROVIDER_SITE_OTHER): Payer: 59 | Admitting: Family Medicine

## 2022-07-31 ENCOUNTER — Encounter: Payer: Self-pay | Admitting: Family Medicine

## 2022-07-31 VITALS — BP 130/84 | HR 92 | Temp 98.5°F | Ht 70.0 in | Wt 267.1 lb

## 2022-07-31 DIAGNOSIS — K9189 Other postprocedural complications and disorders of digestive system: Secondary | ICD-10-CM

## 2022-07-31 DIAGNOSIS — M1A09X0 Idiopathic chronic gout, multiple sites, without tophus (tophi): Secondary | ICD-10-CM

## 2022-07-31 DIAGNOSIS — I10 Essential (primary) hypertension: Secondary | ICD-10-CM | POA: Diagnosis not present

## 2022-07-31 DIAGNOSIS — F10931 Alcohol use, unspecified with withdrawal delirium: Secondary | ICD-10-CM | POA: Diagnosis not present

## 2022-07-31 DIAGNOSIS — I160 Hypertensive urgency: Secondary | ICD-10-CM

## 2022-07-31 DIAGNOSIS — K567 Ileus, unspecified: Secondary | ICD-10-CM

## 2022-07-31 MED ORDER — INDOMETHACIN 50 MG PO CAPS: 50.0000 mg | ORAL_CAPSULE | Freq: Three times a day (TID) | ORAL | 3 refills | Status: DC | PRN

## 2022-07-31 MED ORDER — TRAZODONE HCL 50 MG PO TABS: 50.0000 mg | ORAL_TABLET | Freq: Every evening | ORAL | 3 refills | Status: DC | PRN

## 2022-07-31 MED ORDER — COLCHICINE 0.6 MG PO TABS: 0.6000 mg | ORAL_TABLET | Freq: Two times a day (BID) | ORAL | 2 refills | Status: DC | PRN

## 2022-07-31 NOTE — Patient Instructions (Signed)
Indomethacin 50 mg, take 3 times a day until your gout feels better  Colchicine take 0.6 mg twice a day until your gout feels better.

## 2022-07-31 NOTE — Progress Notes (Unsigned)
    Yared Barefoot T. Nezar Buckles, MD, [facility] [place], [address]  Phone: [phone]  FAX: [place] - 56 y.o. male  MRN [id number]  Date of Birth: [birth year]  Date: 07/31/2022  PCP: Owens Loffler, MD  Referral: Owens Loffler, MD  Chief Complaint  Patient presents with   Finger Swelling    S/P knee replacement on 07/07/22.  Had Blood Clot in left arm while in hospigtal   Subjective:   Randy Combs is a 56 y.o. very pleasant male patient with Body mass index is 38.33 kg/m. who presents with the following:  Has used colchicine, Indocin - all for his gout.  Go over hosp -    Lab Results  Component Value Date   HGBA1C 6.3 (H) 06/30/2022     Review of Systems is noted in the HPI, as appropriate  Objective:   BP 130/84   Pulse 92   Temp 98.5 F (36.9 C) (Oral)   Ht '5\' 10"'$  (1.778 m)   Wt 267 lb 2 oz (121.2 kg)   SpO2 97%   BMI 38.33 kg/m   GEN: No acute distress; alert,appropriate. PULM: Breathing comfortably in no respiratory distress PSYCH: Normally interactive.   Laboratory and Imaging Data:  Assessment and Plan:   ***

## 2022-08-14 ENCOUNTER — Ambulatory Visit: Payer: 59 | Admitting: Family Medicine

## 2022-08-14 ENCOUNTER — Encounter: Payer: Self-pay | Admitting: Family Medicine

## 2022-08-14 ENCOUNTER — Encounter: Payer: Self-pay | Admitting: *Deleted

## 2022-08-14 VITALS — BP 120/92 | HR 89 | Temp 98.3°F | Ht 70.0 in | Wt 253.1 lb

## 2022-08-14 DIAGNOSIS — R200 Anesthesia of skin: Secondary | ICD-10-CM

## 2022-08-14 DIAGNOSIS — R5383 Other fatigue: Secondary | ICD-10-CM

## 2022-08-14 DIAGNOSIS — M1A09X1 Idiopathic chronic gout, multiple sites, with tophus (tophi): Secondary | ICD-10-CM | POA: Diagnosis not present

## 2022-08-14 DIAGNOSIS — E538 Deficiency of other specified B group vitamins: Secondary | ICD-10-CM

## 2022-08-14 DIAGNOSIS — D649 Anemia, unspecified: Secondary | ICD-10-CM

## 2022-08-14 DIAGNOSIS — G9340 Encephalopathy, unspecified: Secondary | ICD-10-CM

## 2022-08-14 DIAGNOSIS — R519 Headache, unspecified: Secondary | ICD-10-CM

## 2022-08-14 NOTE — Progress Notes (Addendum)
Chihiro Frey T. Shaquill Iseman, MD, CAQ Sports Medicine [facility] [address] [place], [address]  Phone: [phone]  FAX: [phone]  Randy Combs - 56 y.o. male  MRN [id number]  Date of Birth: 10-30-1966  Date: 08/14/2022  PCP: Hannah Beat, MD  Referral: Hannah Beat, MD  Chief Complaint  Patient presents with   Right side of head is numb and tender to touch    X 3 weeks   Dizziness   Subjective:   Randy Combs is a 56 y.o. very pleasant male patient with Body mass index is 36.32 kg/m. who presents with the following:  Patient presents again after follow-up from a complex hospitalization where he was in the ICU for close to a week and had altered mental status and encephalopathy.  This was in the setting of what is presumed alcohol withdrawal, postop ileus, and complication after total knee arthroplasty.  Head has been hurting everything on the R side of his head.  Numb and sore to the touch.  He has been having focal numbness on the right side of his head that has been present for at least 3 weeks, since he left the hospital.  There is no known reason for this, and prior to his hospitalization he did not have any similar sensations.  He also has right-sided head pain that is new and different compared to his baseline.  There is also some focal tenderness to palpation along the same course of numbness in the head.  He does feel tired and fatigued essentially all the time.  Labs in the hospital were notable for hemoglobin of 9, requiring transfusion, initially down to the 8 range.  Ferritin was at 470 in the hospital, and his B12 level was low.  Last CBC Lab Results  Component Value Date   WBC 14.5 (H) 07/19/2022   HGB 9.9 (L) 07/19/2022   HCT 30.1 (L) 07/19/2022   MCV 103.4 (H) 07/19/2022   MCH 34.0 07/19/2022   RDW 14.7 07/19/2022   PLT 379 07/19/2022     During the hospitalization he was completely altered  mentally, and became combative, needed to be restrained and was ultimately intubated due to acute respiratory failure.  He did have metabolic encephalopathy at the time.  Anemia was felt to be postoperative, after knee arthroplasty.  AST and ALT were also elevated while he was in the hospital  07/31/2022 Last OV with Hannah Beat, MD  Admit date:     07/07/2022  Discharge date: 07/20/22    He is here to primarily talk about some finger swelling, but also he was recently discharged from the hospital just less than 2 weeks ago after a prolonged stay.   Post left-sided total knee arthroplasty he developed a postoperative ileus with complications additionally with alcohol withdrawal, acute respiratory failure, and metabolic encephalopathy felt secondary to alcohol withdrawal.  He ultimately was in the intensive care unit for 6 days, and ultimately required intubation for his respiratory failure.   He also had hypertensive urgency in the hospital and was continued at discharge on Coreg, Norvasc, and while in the hospital was also given clonidine as well as some IV Cleviprex as his oral intake was advanced.   Currently also on MiraLAX after his ileus.   By history, he had been drinking daily, upwards of 1/5 of liquor a day at times on the weekend.  His wife is also here in the examination room, she was not fully  aware of the extent of his alcohol use prior to admission.  Right now, he has fully discontinued alcohol and plans to do this for the remainder of his life.   He is currently having some insomnia.   Has used colchicine, Indocin - all for his gout. Historically has had severe gout. Right now he is having some severe finger swelling, redness and pain at the elbows as well.        Lab Results  Component Value Date    HGBA1C 6.3 (H) 06/30/2022  -Now off of all medication, he did have some elevated blood sugars while in the hospital.   Review of Systems is noted in the HPI, as  appropriate  Objective:   BP (!) 120/92   Pulse 89   Temp 98.3 F (36.8 C) (Oral)   Ht $R'5\' 10"'pA$  (1.778 m)   Wt 253 lb 2 oz (114.8 kg)   SpO2 97%   BMI 36.32 kg/m   Orthostatic VS for the past 24 hrs:  BP- Lying Pulse- Lying BP- Sitting Pulse- Sitting BP- Standing at 0 minutes Pulse- Standing at 0 minutes  08/14/22 1542 (!) 153/105 79 (!) 121/100 79 (!) 139/104 101   Home blood pressures have been roughly 130/80, and he has been checking this every day.  GEN: No acute distress; alert,appropriate. PULM: Breathing comfortably in no respiratory distress PSYCH: Normally interactive.  CV: RRR, no m/g/r  PULM: Normal respiratory rate, no accessory muscle use. No wheezes, crackles or rhonchi   Neuro: CN 2-12 grossly intact,, but he does have numbness on the right side of his head throughout the anterior and lateral aspect of the right side of his head.Marland Kitchen PERRLA. EOMI. Sensation intact throughout. Str 5/5 all extremities. DTR 2+. No clonus. A and o x 4. Finger nose neg. Heel -shin neg.   PSYCH: Normally interactive. Conversant. Not depressed or anxious appearing.  Calm demeanor.    Laboratory and Imaging Data: No neuroimaging  Lab Review:     Latest Ref Rng & Units 07/19/2022    9:05 AM 07/18/2022    3:31 AM 07/17/2022    2:55 AM  CBC EXTENDED  WBC 4.0 - 10.5 K/uL 14.5  10.7  14.0   RBC 4.22 - 5.81 MIL/uL 2.91  3.09  3.18   Hemoglobin 13.0 - 17.0 g/dL 9.9  10.7  11.2   HCT 39.0 - 52.0 % 30.1  32.7  33.6   Platelets 150 - 400 K/uL 379  366  332        Latest Ref Rng & Units 07/19/2022    9:05 AM 07/18/2022    3:31 AM 07/17/2022    2:55 AM  BMP  Glucose 70 - 99 mg/dL 153  141  151   BUN 6 - 20 mg/dL $Remove'12  14  14   'BfyPPtk$ Creatinine 0.61 - 1.24 mg/dL 0.88  0.95  0.80   Sodium 135 - 145 mmol/L 137  137  136   Potassium 3.5 - 5.1 mmol/L 3.7  3.9  3.2   Chloride 98 - 111 mmol/L 100  103  102   CO2 22 - 32 mmol/L $RemoveB'27  24  26   'OqRJcIBN$ Calcium 8.9 - 10.3 mg/dL 8.3  8.7  8.3        Latest Ref Rng  & Units 07/17/2022    2:55 AM 07/16/2022    3:09 AM 07/14/2022    7:18 AM  Hepatic Function  Total Protein 6.5 - 8.1 g/dL 6.6  6.9  6.3   Albumin 3.5 - 5.0 g/dL 2.5  2.9  2.5   AST 15 - 41 U/L 75  49  33   ALT 0 - 44 U/L 46  31  21   Alk Phosphatase 38 - 126 U/L 100  93  82   Total Bilirubin 0.3 - 1.2 mg/dL 1.1  1.0  1.1     Lab Results  Component Value Date   CHOL 210 (H) 05/09/2021   Lab Results  Component Value Date   HDL 39.80 05/09/2021   No results found for: "LDLCALC" Lab Results  Component Value Date   TRIG 307 (H) 07/14/2022   Lab Results  Component Value Date   CHOLHDL 5 05/09/2021   No results for input(s): "PSA" in the last 72 hours. No results found for: "HCVAB" No results found for: "VD25OH"   Lab Results  Component Value Date   HGBA1C 6.3 (H) 06/30/2022   HGBA1C 6.7 (H) 03/29/2022   HGBA1C 9.9 (H) 05/09/2021   Lab Results  Component Value Date   CREATININE 0.88 07/19/2022     Assessment and Plan:     ICD-10-CM   1. Right facial numbness  R20.0 MR Brain W Wo Contrast    2. B12 deficiency  E53.8 Vitamin B12    3. Anemia of unknown etiology  D64.9 CBC with Differential/Platelet    IBC + Ferritin    Vitamin B12    4. Idiopathic chronic gout of multiple sites with tophus  M1A.09X1 Uric acid    5. Other fatigue  B84.66 Basic metabolic panel    Hepatic function panel    6. Acute intractable headache, unspecified headache type  R51.9 MR Brain W Wo Contrast    7. Encephalopathy  G93.40 MR Brain W Wo Contrast     Total encounter time: 41 minutes. This includes total time spent on the day of encounter.  Extensive time spent on case review, record review, and face-to-face evaluation of multiple complex problems.  Extended time spent in coordination of care.  Recent altered mental status, encephalopathy, requiring ICU stay and intubation.  Ongoing right-sided facial and head numbness, new onset headache, and I think that this needs to be explored  further.  Obtain an MRI of the brain with and without contrast to evaluate for potential neoplasm, demyelinating disease, or prior stroke.  Generally, he feels bad in general.  I think that we need to follow-up on his B12 deficiency, elevated LFTs, as well as what had been a fairly profound anemia.  Additional basic work-up as above.  Follow-up plan of care will depend upon all of the findings from above.  Medication Management during today's office visit: No orders of the defined types were placed in this encounter.  Medications Discontinued During This Encounter  Medication Reason   polyethylene glycol (MIRALAX / GLYCOLAX) 17 g packet No longer needed (for PRN medications)    Orders placed today for conditions managed today: Orders Placed This Encounter  Procedures   MR Brain W Wo Contrast   CBC with Differential/Platelet   Basic metabolic panel   Hepatic function panel   IBC + Ferritin   Vitamin B12   Uric acid    Disposition: No follow-ups on file.  [employer] One speech-to-text software was used for transcription in this dictation.  Possible transcriptional errors can occur using Editor, commissioning.   Signed,  Maud Deed. Providencia Hottenstein, MD   Outpatient Encounter Medications as of 08/14/2022  Medication Sig   amLODipine (NORVASC)  10 MG tablet Take 1 tablet (10 mg total) by mouth daily.   carvedilol (COREG) 25 MG tablet Take 1 tablet (25 mg total) by mouth 2 (two) times daily with a meal.   colchicine 0.6 MG tablet Take 1 tablet (0.6 mg total) by mouth 2 (two) times daily as needed (Gout).   indomethacin (INDOCIN) 50 MG capsule Take 1 capsule (50 mg total) by mouth 3 (three) times daily as needed for moderate pain.   senna (SENOKOT) 8.6 MG TABS tablet Take 1 tablet (8.6 mg total) by mouth daily as needed for mild constipation.   thiamine (VITAMIN B1) 100 MG tablet Take 1 tablet (100 mg total) by mouth daily.   traZODone (DESYREL) 50 MG tablet Take 1-2 tablets (50-100 mg total)  by mouth at bedtime as needed for sleep.   [DISCONTINUED] polyethylene glycol (MIRALAX / GLYCOLAX) 17 g packet Take 17 g by mouth daily as needed for moderate constipation.   No facility-administered encounter medications on file as of 08/14/2022.

## 2022-08-15 LAB — BASIC METABOLIC PANEL
BUN: 13 mg/dL (ref 6–23)
CO2: 29 mEq/L (ref 19–32)
Calcium: 10.3 mg/dL (ref 8.4–10.5)
Chloride: 97 mEq/L (ref 96–112)
Creatinine, Ser: 1.29 mg/dL (ref 0.40–1.50)
GFR: 62.46 mL/min (ref >60.00)
Glucose, Bld: 102 mg/dL — ABNORMAL HIGH (ref 70–99)
Potassium: 4.6 mEq/L (ref 3.5–5.1)
Sodium: 136 mEq/L (ref 135–145)

## 2022-08-15 LAB — CBC WITH DIFFERENTIAL/PLATELET
Basophils Absolute: 0.1 10*3/uL (ref 0.0–0.1)
Basophils Relative: 1.1 % (ref 0.0–3.0)
Eosinophils Absolute: 0.1 10*3/uL (ref 0.0–0.7)
Eosinophils Relative: 1.4 % (ref 0.0–5.0)
HCT: 40.2 % (ref 39.0–52.0)
Hemoglobin: 13.6 g/dL (ref 13.0–17.0)
Lymphocytes Relative: 22.1 % (ref 12.0–46.0)
Lymphs Abs: 1.5 10*3/uL (ref 0.7–4.0)
MCHC: 33.9 g/dL (ref 30.0–36.0)
MCV: 98 fl (ref 78.0–100.0)
Monocytes Absolute: 0.6 10*3/uL (ref 0.1–1.0)
Monocytes Relative: 8.1 % (ref 3.0–12.0)
Neutro Abs: 4.6 10*3/uL (ref 1.4–7.7)
Neutrophils Relative %: 67.3 % (ref 43.0–77.0)
Platelets: 285 10*3/uL (ref 150.0–400.0)
RBC: 4.1 Mil/uL — ABNORMAL LOW (ref 4.22–5.81)
RDW: 14.1 % (ref 11.5–15.5)
WBC: 6.9 10*3/uL (ref 4.0–10.5)

## 2022-08-15 LAB — HEPATIC FUNCTION PANEL
ALT: 19 U/L (ref 0–53)
AST: 18 U/L (ref 0–37)
Albumin: 4.4 g/dL (ref 3.5–5.2)
Alkaline Phosphatase: 84 U/L (ref 39–117)
Bilirubin, Direct: 0.2 mg/dL (ref 0.0–0.3)
Total Bilirubin: 0.9 mg/dL (ref 0.2–1.2)
Total Protein: 8.1 g/dL (ref 6.0–8.3)

## 2022-08-15 LAB — IBC + FERRITIN
Ferritin: 258.9 ng/mL (ref 22.0–322.0)
Iron: 86 ug/dL (ref 42–165)
Saturation Ratios: 27.9 % (ref 20.0–50.0)
TIBC: 308 ug/dL (ref 250.0–450.0)
Transferrin: 220 mg/dL (ref 212.0–360.0)

## 2022-08-15 LAB — URIC ACID: Uric Acid, Serum: 9.1 mg/dL — ABNORMAL HIGH (ref 4.0–7.8)

## 2022-08-15 LAB — VITAMIN B12: Vitamin B-12: 199 pg/mL — ABNORMAL LOW (ref 211–911)

## 2022-08-23 ENCOUNTER — Ambulatory Visit: Payer: 59 | Admitting: Internal Medicine

## 2022-08-24 ENCOUNTER — Encounter: Payer: Self-pay | Admitting: Orthopaedic Surgery

## 2022-08-24 ENCOUNTER — Ambulatory Visit (INDEPENDENT_AMBULATORY_CARE_PROVIDER_SITE_OTHER): Payer: 59 | Admitting: Orthopaedic Surgery

## 2022-08-24 DIAGNOSIS — Z96652 Presence of left artificial knee joint: Secondary | ICD-10-CM

## 2022-08-24 NOTE — Progress Notes (Signed)
The patient is now 7 weeks status post a left total knee arthroplasty.  He is doing excellent.  Has been released by therapy.  His left knee still has some swelling and wants to be expected.  His incision looks good.  His range of motion is entirely full.  The knee feels ligamentously stable.  I gave him a note to return to full work duties without restrictions starting Monday, September 25.  We will see him back in 6 months unless there are issues before then.  At his next visit we will have a standing AP and lateral of his left operative knee.  All questions and concerns were answered and addressed.

## 2022-08-27 ENCOUNTER — Ambulatory Visit
Admission: RE | Admit: 2022-08-27 | Discharge: 2022-08-27 | Disposition: A | Discharge status: Home / Self Care | Payer: 59 | Source: Ambulatory Visit | Attending: Family Medicine | Admitting: Family Medicine

## 2022-08-27 DIAGNOSIS — R519 Headache, unspecified: Secondary | ICD-10-CM

## 2022-08-27 DIAGNOSIS — R200 Anesthesia of skin: Secondary | ICD-10-CM

## 2022-08-27 DIAGNOSIS — G9340 Encephalopathy, unspecified: Secondary | ICD-10-CM

## 2022-08-28 ENCOUNTER — Telehealth: Payer: Self-pay | Admitting: Family Medicine

## 2022-08-28 MED ORDER — ALPRAZOLAM 0.5 MG PO TABS: ORAL_TABLET | ORAL | 0 refills | Status: DC

## 2022-08-28 NOTE — Telephone Encounter (Signed)
I sent him in a xanax to take before his MRI.  He will need someone else to drive him there and home.

## 2022-08-28 NOTE — Telephone Encounter (Signed)
We just did a lot including specifically:  Iron levels and blood count are totally normal.   B12 level was low - this might entirely explain why he is tired and may explain some of his neurological symptoms:  "The 1 thing that I noticed on your lab work that could be responsible for some strange tingling or numbness would be a B12 level.  B12 can give some unusual neurological changes, and I hope that is it, because that is easily correctable."  Not sure if they did not get my prior message?  Be sure to take B12 1,000 micrograms each day.  Maybe read my last message to them from all of his recent bloodwork.

## 2022-08-28 NOTE — Addendum Note (Signed)
Addended by: Owens Loffler on: 08/28/2022 09:48 AM   Modules accepted: Orders

## 2022-08-28 NOTE — Telephone Encounter (Signed)
Patient wife called and stated he had an appointment to get an MRI but he was claustrophobic and wanted to know if he can get some medication for it. Call back number [phone].

## 2022-08-28 NOTE — Telephone Encounter (Signed)
Kim notified as instructed by telephone.  States understanding.

## 2022-08-28 NOTE — Telephone Encounter (Signed)
Patient wife called and stated if she can get some blood work for patient cause he is tired and wanted to know if the iron levels are up some. Call back number [phone].

## 2022-08-28 NOTE — Telephone Encounter (Signed)
Mr. Randy Combs notified as instructed by telephone.  He did start the B12 1000 mcg as previous instructed.  He states his wife is just worried because he is still having issues.  He will continue taking his Iron and B12.  He will reschedule his MRI.  FYI to Dr. Lorelei Pont.

## 2022-08-28 NOTE — Telephone Encounter (Signed)
Patient wife called in and stated that Chanel was scheduled for a MRI yesterday but didn't get it done due to claustrophobia. She stated that she will get him rescheduled but he will need something to be called in for him to have it done. Thank you!

## 2022-09-12 ENCOUNTER — Other Ambulatory Visit: Payer: 59

## 2022-11-08 NOTE — Progress Notes (Deleted)
Office Visit Note  Patient: Randy Combs             Date of Birth: 1966-09-09           MRN: [id number]             PCP: Owens Loffler, MD Referring: Owens Loffler, MD Visit Date: 11/09/2022 Occupation: _0 @  Subjective:  No chief complaint on file.   History of Present Illness: Randy Combs is a 56 y.o. male here for evaluation and management of refractory chronic gout and concern for inflammatory polyarthritis in general. He saw rheumatology for these issues previously, most recently [facility] rheum in 2019 at that time treated with colchicine and indomethacin and impression was longstanding polyarticular gout and also considerable osteoarthritis. He had left knee replacement in August with major complications requirig hospitalization and intubation for several days related to alcohol withdrawal and hypertensive emergency.  ***   Labs reviewed 03/2022 ANA neg RF neg CCP neg ESR 21 CRP 11.8 Uric acid 10.8  Activities of Daily Living:  Patient reports morning stiffness for *** {minute/hour:19697}.   Patient {ACTIONS;DENIES/REPORTS:21021675::"Denies"} nocturnal pain.  Difficulty dressing/grooming: {ACTIONS;DENIES/REPORTS:21021675::"Denies"} Difficulty climbing stairs: {ACTIONS;DENIES/REPORTS:21021675::"Denies"} Difficulty getting out of chair: {ACTIONS;DENIES/REPORTS:21021675::"Denies"} Difficulty using hands for taps, buttons, cutlery, and/or writing: {ACTIONS;DENIES/REPORTS:21021675::"Denies"}  No Rheumatology ROS completed.   PMFS History:  Patient Active Problem List   Diagnosis Date Noted   Alcohol withdrawal ([place]) 07/18/2022   Ileus, postoperative ([place])    Dip use 07/05/2021   Uncontrolled type 2 diabetes mellitus with hyperglycemia, without long-term current use of insulin ([place]) 06/12/2021   Hiatal hernia 03/09/2016   Anemia, iron deficiency 03/01/2015   Idiopathic chronic gout, multiple sites, without tophus ([place]) 03/01/2015   Essential  hypertension 03/04/2014    Past Medical History:  Diagnosis Date   Alcohol abuse, in remission    quit in 2008   Arthritis    Bleeding ulcer    Blood transfusion without reported diagnosis    Glaucoma    Gout of multiple sites 03/01/2015   Hiatal hernia    Hypertension    Uncontrolled type 2 diabetes mellitus, without long-term current use of insulin 06/12/2021    Family History  Adopted: Yes  Problem Relation Age of Onset   Lung cancer Mother    Healthy Father    Healthy Child    Healthy Child    Colon cancer Neg Hx    Esophageal cancer Neg Hx    Past Surgical History:  Procedure Laterality Date   APPENDECTOMY  1970's   HERNIA REPAIR     abdominal   TOTAL KNEE ARTHROPLASTY Left 07/07/2022   Procedure: LEFT TOTAL KNEE ARTHROPLASTY;  Surgeon: Mcarthur Rossetti, MD;  Location: [facility];  Service: Orthopedics;  Laterality: Left;   Social History   Social History Narrative   Grading work (runs equipment)      Married      Regular exercise      Quit alcohol;tobacco      No drugs   Immunization History  Administered Date(s) Administered   Influenza,inj,Quad PF,6+ Mos 10/12/2017, 10/23/2019   PFIZER(Purple Top)SARS-COV-2 Vaccination 06/22/2020, 10/06/2020   PNEUMOCOCCAL CONJUGATE-20 07/04/2021   Td 12/04/2005   Tdap 07/04/2012   Zoster Recombinat (Shingrix) 07/04/2021     Objective: Vital Signs: There were no vitals taken for this visit.   Physical Exam   Musculoskeletal Exam: ***  CDAI Exam: CDAI Score: -- Patient Global: --; Provider Global: -- Swollen: --; Tender: -- Joint Exam 11/09/2022  No joint exam has been documented for this visit   There is currently no information documented on the homunculus. Go to the Rheumatology activity and complete the homunculus joint exam.  Investigation: No additional findings.  Imaging: No results found.  Recent Labs: Lab Results  Component Value Date   WBC 6.9 08/14/2022   HGB 13.6 08/14/2022   PLT  285.0 08/14/2022   NA 136 08/14/2022   K 4.6 08/14/2022   CL 97 08/14/2022   CO2 29 08/14/2022   GLUCOSE 102 (H) 08/14/2022   BUN 13 08/14/2022   CREATININE 1.29 08/14/2022   BILITOT 0.9 08/14/2022   ALKPHOS 84 08/14/2022   AST 18 08/14/2022   ALT 19 08/14/2022   PROT 8.1 08/14/2022   ALBUMIN 4.4 08/14/2022   CALCIUM 10.3 08/14/2022   GFRAA >60 08/17/2019    Speciality Comments: No specialty comments available.  Procedures:  No procedures performed Allergies: Patient has no active allergies.   Assessment / Plan:     Visit Diagnoses: No diagnosis found.  Orders: No orders of the defined types were placed in this encounter.  No orders of the defined types were placed in this encounter.   Face-to-face time spent with patient was *** minutes. Greater than 50% of time was spent in counseling and coordination of care.  Follow-Up Instructions: No follow-ups on file.   Collier Salina, MD  Note - This record has been created using [employer].  Chart creation errors have been sought, but may not always  have been located. Such creation errors do not reflect on  the standard of medical care.

## 2022-11-09 ENCOUNTER — Ambulatory Visit: Payer: 59 | Attending: Internal Medicine | Admitting: Internal Medicine

## 2022-12-07 ENCOUNTER — Other Ambulatory Visit: Payer: Self-pay | Admitting: Family Medicine

## 2022-12-20 ENCOUNTER — Ambulatory Visit: Payer: 59 | Admitting: Family Medicine

## 2022-12-20 NOTE — Progress Notes (Deleted)
    Betsey Sossamon T. Keisuke Hollabaugh, MD, [place] at [facility] [place], [address]  Phone: [phone]  FAX: [place] - 57 y.o. male  MRN [id number]  Date of Birth: 07/08/1966  Date: 12/20/2022  PCP: Owens Loffler, MD  Referral: Owens Loffler, MD  No chief complaint on file.  Subjective:   Randy Combs is a 57 y.o. very pleasant male patient with There is no height or weight on file to calculate BMI. who presents with the following:  He is here for a personal issue, and he would like labs draw.     Diabetes Mellitus: Tolerating Medications: yes Compliance with diet: fair, There is no height or weight on file to calculate BMI. Exercise: minimal / intermittent Avg blood sugars at home: not checking Foot problems: none Hypoglycemia: none No nausea, vomitting, blurred vision, polyuria.  Lab Results  Component Value Date   HGBA1C 6.3 (H) 06/30/2022   HGBA1C 6.7 (H) 03/29/2022   HGBA1C 9.9 (H) 05/09/2021   Lab Results  Component Value Date   CREATININE 1.29 08/14/2022    Wt Readings from Last 3 Encounters:  08/14/22 253 lb 2 oz (114.8 kg)  07/31/22 267 lb 2 oz (121.2 kg)  07/19/22 297 lb 6.4 oz (134.9 kg)     Review of Systems is noted in the HPI, as appropriate  Objective:   There were no vitals taken for this visit.  GEN: No acute distress; alert,appropriate. PULM: Breathing comfortably in no respiratory distress PSYCH: Normally interactive.   Laboratory and Imaging Data:  Assessment and Plan:   ***

## 2022-12-24 NOTE — Progress Notes (Signed)
Randy Weiand T. [name], MD, [place] at [facility] [place], [address]  Phone: [phone]  FAX: [phone]  Randy Combs - 57 y.o. male  MRN [id number]  Date of Birth: 08-01-1966  Date: 12/25/2022  PCP: Owens Loffler, MD  Referral: Owens Loffler, MD  Chief Complaint  Patient presents with   Alcohol Problem   Subjective:   Randy Combs is a 57 y.o. very pleasant male patient with Body mass index is 38.36 kg/m. who presents with the following:  Randy Combs is here for personal issue, and he would like to have some lab work done.  Got back to drinking a 1/5 of liquor each day.   He is here with his wife again after he is restarted drinking.  Prior to his knee replacement in August 2023, he had been drinking upwards of 1/5 or more of hard liquor each day.  While he was in the hospital after his joint replacement, he got acute metabolic encephalopathy from alcohol withdrawal and he was in the intensive care unit for 6 days.  He went into acute respiratory failure and required intubation secondary to DTs and metabolic encephalopathy.    He is here with his wife today, and she is concerned.  Right now they have split up for a time, and he has not drank any alcohol in 2 weeks.  After his last hospitalization, he did have elevated LFTs as well as some anemia.  Review of Systems is noted in the HPI, as appropriate  Objective:   BP 130/84   Pulse 73   Temp 98.3 F (36.8 C) (Oral)   Ht '5\' 10"'$  (1.778 m)   Wt 267 lb 6 oz (121.3 kg)   SpO2 96%   BMI 38.36 kg/m   GEN: No acute distress; alert,appropriate. PULM: Breathing comfortably in no respiratory distress PSYCH: Normally interactive.   Laboratory and Imaging Data:  Assessment and Plan:     ICD-10-CM   1. Alcoholism ([place])  F10.20     2. Anemia of unknown etiology  D64.9 CBC with Differential/Platelet    3. Elevated LFTs  R79.89 Hepatic function  panel     Total encounter time: 30 minutes. This includes total time spent on the day of encounter.  Time spent in counseling today regarding alcohol abuse, I attempted to do some basic marital counseling, and counseling in general regarding alcohol and his overall state of health.  I am going to start him on naltrexone, and I encouraged him to start going to [employer].  We will check a CBC and liver panel today, as well given his prior abnormalities  Medication Management during today's office visit: Meds ordered this encounter  Medications   naltrexone (DEPADE) 50 MG tablet    Sig: Take 1 tablet (50 mg total) by mouth daily.    Dispense:  30 tablet    Refill:  3   Medications Discontinued During This Encounter  Medication Reason   ALPRAZolam (XANAX) 0.5 MG tablet Completed Course   senna (SENOKOT) 8.6 MG TABS tablet Completed Course   thiamine (VITAMIN B1) 100 MG tablet Completed Course    Orders placed today for conditions managed today: Orders Placed This Encounter  Procedures   CBC with Differential/Platelet   Hepatic function panel    Disposition: No follow-ups on file.  [employer] speech-to-text software was used for transcription in this dictation.  Possible transcriptional errors can occur using Editor, commissioning.  Signed,  Maud Deed. Demonte Dobratz, MD   Outpatient Encounter Medications as of 12/25/2022  Medication Sig   amLODipine (NORVASC) 10 MG tablet Take 1 tablet (10 mg total) by mouth daily.   carvedilol (COREG) 25 MG tablet Take 1 tablet (25 mg total) by mouth 2 (two) times daily with a meal.   colchicine 0.6 MG tablet Take 1 tablet (0.6 mg total) by mouth 2 (two) times daily as needed (Gout).   indomethacin (INDOCIN) 50 MG capsule TAKE 1 CAPSULE (50 MG TOTAL) BY MOUTH 3 (THREE) TIMES DAILY AS NEEDED FOR MODERATE PAIN.   naltrexone (DEPADE) 50 MG tablet Take 1 tablet (50 mg total) by mouth daily.   traZODone (DESYREL) 50 MG tablet TAKE 1-2 TABLETS BY  MOUTH AT BEDTIME AS NEEDED FOR SLEEP.   [DISCONTINUED] ALPRAZolam (XANAX) 0.5 MG tablet Take 45 minutes before MRI   [DISCONTINUED] senna (SENOKOT) 8.6 MG TABS tablet Take 1 tablet (8.6 mg total) by mouth daily as needed for mild constipation.   [DISCONTINUED] thiamine (VITAMIN B1) 100 MG tablet Take 1 tablet (100 mg total) by mouth daily.   No facility-administered encounter medications on file as of 12/25/2022.

## 2022-12-25 ENCOUNTER — Ambulatory Visit: Payer: 59 | Admitting: Family Medicine

## 2022-12-25 ENCOUNTER — Encounter: Payer: Self-pay | Admitting: Family Medicine

## 2022-12-25 VITALS — BP 130/84 | HR 73 | Temp 98.3°F | Ht 70.0 in | Wt 267.4 lb

## 2022-12-25 DIAGNOSIS — R7989 Other specified abnormal findings of blood chemistry: Secondary | ICD-10-CM | POA: Diagnosis not present

## 2022-12-25 DIAGNOSIS — D649 Anemia, unspecified: Secondary | ICD-10-CM | POA: Diagnosis not present

## 2022-12-25 DIAGNOSIS — Z23 Encounter for immunization: Secondary | ICD-10-CM

## 2022-12-25 DIAGNOSIS — F1020 Alcohol dependence, uncomplicated: Secondary | ICD-10-CM

## 2022-12-25 LAB — CBC WITH DIFFERENTIAL/PLATELET
Basophils Absolute: 0.1 10*3/uL (ref 0.0–0.1)
Basophils Relative: 0.8 % (ref 0.0–3.0)
Eosinophils Absolute: 0.2 10*3/uL (ref 0.0–0.7)
Eosinophils Relative: 3.3 % (ref 0.0–5.0)
HCT: 45.2 % (ref 39.0–52.0)
Hemoglobin: 15.5 g/dL (ref 13.0–17.0)
Lymphocytes Relative: 22.2 % (ref 12.0–46.0)
Lymphs Abs: 1.4 10*3/uL (ref 0.7–4.0)
MCHC: 34.3 g/dL (ref 30.0–36.0)
MCV: 99.5 fl (ref 78.0–100.0)
Monocytes Absolute: 0.6 10*3/uL (ref 0.1–1.0)
Monocytes Relative: 8.6 % (ref 3.0–12.0)
Neutro Abs: 4.2 10*3/uL (ref 1.4–7.7)
Neutrophils Relative %: 65.1 % (ref 43.0–77.0)
Platelets: 259 10*3/uL (ref 150.0–400.0)
RBC: 4.54 Mil/uL (ref 4.22–5.81)
RDW: 16.5 % — ABNORMAL HIGH (ref 11.5–15.5)
WBC: 6.4 10*3/uL (ref 4.0–10.5)

## 2022-12-25 LAB — HEPATIC FUNCTION PANEL
ALT: 30 U/L (ref 0–53)
AST: 26 U/L (ref 0–37)
Albumin: 4.5 g/dL (ref 3.5–5.2)
Alkaline Phosphatase: 137 U/L — ABNORMAL HIGH (ref 39–117)
Bilirubin, Direct: 0.2 mg/dL (ref 0.0–0.3)
Total Bilirubin: 0.8 mg/dL (ref 0.2–1.2)
Total Protein: 7.8 g/dL (ref 6.0–8.3)

## 2022-12-25 MED ORDER — NALTREXONE HCL 50 MG PO TABS: 50.0000 mg | ORAL_TABLET | Freq: Every day | ORAL | 3 refills | Status: DC

## 2022-12-25 NOTE — Addendum Note (Signed)
Addended by: Carter Kitten on: 12/25/2022 10:47 AM   Modules accepted: Orders

## 2023-01-12 ENCOUNTER — Other Ambulatory Visit: Payer: Self-pay | Admitting: Family Medicine

## 2023-02-08 ENCOUNTER — Encounter: Payer: Self-pay | Admitting: Radiology

## 2023-02-21 ENCOUNTER — Ambulatory Visit: Payer: 59 | Admitting: Orthopaedic Surgery

## 2023-04-22 ENCOUNTER — Other Ambulatory Visit: Payer: Self-pay

## 2023-04-22 ENCOUNTER — Emergency Department: Payer: 59

## 2023-04-22 ENCOUNTER — Emergency Department
Admission: EM | Admit: 2023-04-22 | Discharge: 2023-04-22 | Disposition: A | Discharge status: Home / Self Care | Payer: 59 | Attending: Emergency Medicine | Admitting: Emergency Medicine

## 2023-04-22 DIAGNOSIS — S93402A Sprain of unspecified ligament of left ankle, initial encounter: Secondary | ICD-10-CM

## 2023-04-22 DIAGNOSIS — X500XXA Overexertion from strenuous movement or load, initial encounter: Secondary | ICD-10-CM | POA: Diagnosis not present

## 2023-04-22 DIAGNOSIS — S99912A Unspecified injury of left ankle, initial encounter: Secondary | ICD-10-CM | POA: Diagnosis present

## 2023-04-22 MED ORDER — OXYCODONE-ACETAMINOPHEN 5-325 MG PO TABS
1.0000 | ORAL_TABLET | Freq: Once | ORAL | Status: AC
  Administered 2023-04-22: 1 via ORAL
  Filled 2023-04-22: qty 1

## 2023-04-22 MED ORDER — KETOROLAC TROMETHAMINE 30 MG/ML IJ SOLN
30.0000 mg | Freq: Once | INTRAMUSCULAR | Status: AC
  Administered 2023-04-22: 30 mg via INTRAMUSCULAR
  Filled 2023-04-22: qty 1

## 2023-04-22 MED ORDER — KETOROLAC TROMETHAMINE 10 MG PO TABS: 10.0000 mg | ORAL_TABLET | Freq: Four times a day (QID) | ORAL | 0 refills | Status: DC | PRN

## 2023-04-22 MED ORDER — HYDROCODONE-ACETAMINOPHEN 5-325 MG PO TABS: 1.0000 | ORAL_TABLET | ORAL | 0 refills | Status: DC | PRN

## 2023-04-22 NOTE — ED Triage Notes (Signed)
Pt to ED via POV c/o left ankle linjury. Pt states he twisted ankle 2 hrs ago. Left ankle swollen. +pedal pulse

## 2023-04-22 NOTE — ED Provider Notes (Signed)
[facility] Provider Note  Patient Contact: 11:38 PM (approximate)   History   Ankle Pain   HPI  Randy Combs is a 57 y.o. male who presents the emergency department complaining of left ankle pain.  Patient states that he was helping an individual when he stepped awkwardly on the edge of a sidewalk and rolled his ankle.  Patient states that he has had increasing pain, swelling to the ankle joint and wanted to ensure that there is no fracture.  No other injury or complaint.     Physical Exam   Triage Vital Signs: ED Triage Vitals  Enc Vitals Group     BP 04/22/23 2023 (!) 159/98     Pulse Rate 04/22/23 2023 90     Resp 04/22/23 2023 20     Temp 04/22/23 2023 98.1 F (36.7 C)     Temp Source 04/22/23 2023 Oral     SpO2 04/22/23 2023 97 %     Weight 04/22/23 2024 265 lb (120.2 kg)     Height 04/22/23 2024 5\' 10"  (1.778 m)     Head Circumference --      Peak Flow --      Pain Score 04/22/23 2024 10     Pain Loc --      Pain Edu? --      Excl. in GC? --     Most recent vital signs: Vitals:   04/22/23 2023  BP: (!) 159/98  Pulse: 90  Resp: 20  Temp: 98.1 F (36.7 C)  SpO2: 97%     General: Alert and in no acute distress.  Cardiovascular:  Good peripheral perfusion Respiratory: Normal respiratory effort without tachypnea or retractions. Lungs CTAB.  Musculoskeletal: Full range of motion to all extremities.  Visualization of the left ankle reveals edema but no open wounds.  No ecchymosis.  Tender along the anterior lateral aspect of the ankle without palpable abnormality.  Pulse and sensation intact to the foot. Neurologic:  No gross focal neurologic deficits are appreciated.  Skin:   No rash noted Other:   ED Results / Procedures / Treatments   Labs (all labs ordered are listed, but only abnormal results are displayed) Labs Reviewed - No data to display   EKG     RADIOLOGY  I personally viewed, evaluated, and  interpreted these images as part of my medical decision making, as well as reviewing the written report by the radiologist.  ED Provider Interpretation: No acute traumatic findings to the left foot or ankle.  Severe osteoarthritis and osteonecrosis of the second metatarsal head.  DG Ankle Complete Left  Result Date: 04/22/2023 CLINICAL DATA:  Ankle swelling and pain after twisting it EXAM: LEFT ANKLE COMPLETE - 3+ VIEW; LEFT FOOT - COMPLETE 3+ VIEW COMPARISON:  Radiographs 09/24/2017 FINDINGS: No acute fracture or dislocation.  Mild swelling about the ankle. Advanced arthritis about the first and second MTP joints with bony remodeling about the second metatarsal head. This has progressed from 2018. Plantar calcaneal spur. IMPRESSION: 1. No acute fracture or dislocation. 2. Osteonecrosis of the second metatarsal head. Advanced arthritis about the first and second MTP joints, progressed from 2018. Electronically Signed   By: Minerva Fester M.D.   On: 04/22/2023 23:16   DG Foot Complete Left  Result Date: 04/22/2023 CLINICAL DATA:  Ankle swelling and pain after twisting it EXAM: LEFT ANKLE COMPLETE - 3+ VIEW; LEFT FOOT - COMPLETE 3+ VIEW COMPARISON:  Radiographs 09/24/2017 FINDINGS: No acute  fracture or dislocation.  Mild swelling about the ankle. Advanced arthritis about the first and second MTP joints with bony remodeling about the second metatarsal head. This has progressed from 2018. Plantar calcaneal spur. IMPRESSION: 1. No acute fracture or dislocation. 2. Osteonecrosis of the second metatarsal head. Advanced arthritis about the first and second MTP joints, progressed from 2018. Electronically Signed   By: Minerva Fester M.D.   On: 04/22/2023 23:16    PROCEDURES:  Critical Care performed: No  Procedures   MEDICATIONS ORDERED IN ED: Medications  ketorolac (TORADOL) 30 MG/ML injection 30 mg (has no administration in time range)  oxyCODONE-acetaminophen (PERCOCET/ROXICET) 5-325 MG per tablet  1 tablet (has no administration in time range)     IMPRESSION / MDM / ASSESSMENT AND PLAN / ED COURSE  I reviewed the triage vital signs and the nursing notes.                                 Differential diagnosis includes, but is not limited to, ankle sprain, ankle fracture, ankle dislocation   Patient's presentation is most consistent with acute presentation with potential threat to life or bodily function.   Patient's diagnosis is consistent with ankle sprain.  Patient presents emergency department with pain and swelling to the left ankle.  Patient had an injury tonight.  Imaging reveals no acute fracture.  Patiently placed in ASO stirrup ankle brace with crutches for ambulation.  Patient is given Toradol shot in the emergency department and pain medication.  He will have prescriptions for same at home.  Follow-up with primary care or orthopedics as needed.. Patient is given ED precautions to return to the ED for any worsening or new symptoms.     FINAL CLINICAL IMPRESSION(S) / ED DIAGNOSES   Final diagnoses:  Sprain of left ankle, unspecified ligament, initial encounter     Rx / DC Orders   ED Discharge Orders          Ordered    ketorolac (TORADOL) 10 MG tablet  Every 6 hours PRN        04/22/23 2337    HYDROcodone-acetaminophen (NORCO/VICODIN) 5-325 MG tablet  Every 4 hours PRN        04/22/23 2337             Note:  This document was prepared using Dragon voice recognition software and may include unintentional dictation errors.   Racheal Patches, PA-C 04/22/23 2340    Concha Se, MD 04/24/23 1459

## 2023-04-22 NOTE — Discharge Instructions (Addendum)
The brace you have tonight is called an ASO lace up stirrup ankle brace.  You can buy these at any pharmacy or [employer]

## 2023-04-23 ENCOUNTER — Other Ambulatory Visit: Payer: Self-pay | Admitting: Family Medicine

## 2023-04-23 NOTE — Telephone Encounter (Signed)
Lvmtcb, sent mychart message  

## 2023-04-23 NOTE — Telephone Encounter (Signed)
Please schedule CPE with fasting labs prior with Dr. Copland.  

## 2023-04-24 ENCOUNTER — Telehealth: Payer: Self-pay

## 2023-04-24 NOTE — Transitions of Care (Post Inpatient/ED Visit) (Signed)
   04/24/2023  Name: Randy Combs MRN: [id number] DOB: 09/04/1966  Today's TOC FU Call Status: Today's TOC FU Call Status:: Successful TOC FU Call Competed TOC FU Call Complete Date: 04/24/23  Transition Care Management Follow-up Telephone Call Date of Discharge: 04/22/23 Discharge Facility: [facility] Type of Discharge: Emergency Department Reason for ED Visit: Other: (sprain) How have you been since you were released from the hospital?: Better Any questions or concerns?: No  Items Reviewed: Did you receive and understand the discharge instructions provided?: Yes Medications obtained,verified, and reconciled?: Yes (Medications Reviewed) Any new allergies since your discharge?: No Dietary orders reviewed?: Yes Do you have support at home?: Yes People in Home: spouse  Medications Reviewed Today: Medications Reviewed Today     Reviewed by Karena Addison, LPN (Licensed Practical Nurse) on 04/24/23 at 1559  Med List Status: <None>   Medication Order Taking? Sig Documenting Provider Last Dose Status Informant  amLODipine (NORVASC) 10 MG tablet [id number] Yes TAKE 1 TABLET BY MOUTH EVERY DAY Copland, Spencer, MD Taking Active   carvedilol (COREG) 25 MG tablet [id number] Yes Take 1 tablet (25 mg total) by mouth 2 (two) times daily with a meal. Sharl Ma, Sarina Ill, MD Taking Active   colchicine 0.6 MG tablet [id number] Yes Take 1 tablet (0.6 mg total) by mouth 2 (two) times daily as needed (Gout). Copland, Karleen Hampshire, MD Taking Active   HYDROcodone-acetaminophen (NORCO/VICODIN) 5-325 MG tablet [id number] Yes Take 1 tablet by mouth every 4 (four) hours as needed for moderate pain. Cuthriell, Delorise Royals, PA-C Taking Active   indomethacin (INDOCIN) 50 MG capsule [id number] Yes TAKE 1 CAPSULE (50 MG TOTAL) BY MOUTH 3 (THREE) TIMES DAILY AS NEEDED FOR MODERATE PAIN. Copland, Karleen Hampshire, MD Taking Active   ketorolac (TORADOL) 10 MG tablet [id number] Yes Take 1 tablet (10 mg  total) by mouth every 6 (six) hours as needed. Cuthriell, Delorise Royals, PA-C Taking Active   naltrexone (DEPADE) 50 MG tablet [id number] Yes Take 1 tablet (50 mg total) by mouth daily. Hannah Beat, MD Taking Active   traZODone (DESYREL) 50 MG tablet [id number] Yes TAKE 1 TO 2 TABLETS BY MOUTH AT BEDTIME AS NEEDED FOR SLEEP Copland, Spencer, MD Taking Active             Home Care and Equipment/Supplies: Were Home Health Services Ordered?: NA Any new equipment or medical supplies ordered?: NA  Functional Questionnaire: Do you need assistance with bathing/showering or dressing?: No Do you need assistance with meal preparation?: No Do you need assistance with eating?: No Do you have difficulty maintaining continence: No Do you need assistance with getting out of bed/getting out of a chair/moving?: No Do you have difficulty managing or taking your medications?: No  Follow up appointments reviewed: PCP Follow-up appointment confirmed?: NA Specialist Hospital Follow-up appointment confirmed?: NA Do you need transportation to your follow-up appointment?: No Do you understand care options if your condition(s) worsen?: Yes-patient verbalized understanding    SIGNATURE Karena Addison, LPN [facility] Nurse Health Advisor Direct Dial [phone]

## 2023-05-16 ENCOUNTER — Other Ambulatory Visit: Payer: Self-pay

## 2023-05-16 ENCOUNTER — Emergency Department (HOSPITAL_COMMUNITY)
Admission: EM | Admit: 2023-05-16 | Discharge: 2023-05-16 | Disposition: A | Discharge status: Home / Self Care | Payer: 59 | Attending: Emergency Medicine | Admitting: Emergency Medicine

## 2023-05-16 ENCOUNTER — Emergency Department (HOSPITAL_COMMUNITY): Payer: 59

## 2023-05-16 DIAGNOSIS — I10 Essential (primary) hypertension: Secondary | ICD-10-CM | POA: Diagnosis not present

## 2023-05-16 DIAGNOSIS — R072 Precordial pain: Secondary | ICD-10-CM | POA: Diagnosis present

## 2023-05-16 DIAGNOSIS — E119 Type 2 diabetes mellitus without complications: Secondary | ICD-10-CM | POA: Insufficient documentation

## 2023-05-16 LAB — BASIC METABOLIC PANEL
Anion gap: 16 — ABNORMAL HIGH (ref 5–15)
BUN: 16 mg/dL (ref 6–20)
CO2: 24 mmol/L (ref 22–32)
Calcium: 9.9 mg/dL (ref 8.9–10.3)
Chloride: 97 mmol/L — ABNORMAL LOW (ref 98–111)
Creatinine, Ser: 1.34 mg/dL — ABNORMAL HIGH (ref 0.61–1.24)
GFR, Estimated: >60 mL/min (ref >60)
Glucose, Bld: 101 mg/dL — ABNORMAL HIGH (ref 70–99)
Potassium: 4.2 mmol/L (ref 3.5–5.1)
Sodium: 137 mmol/L (ref 135–145)

## 2023-05-16 LAB — CBC
HCT: 38.6 % — ABNORMAL LOW (ref 39.0–52.0)
Hemoglobin: 13.4 g/dL (ref 13.0–17.0)
MCH: 35.4 pg — ABNORMAL HIGH (ref 26.0–34.0)
MCHC: 34.7 g/dL (ref 30.0–36.0)
MCV: 101.8 fL — ABNORMAL HIGH (ref 80.0–100.0)
Platelets: 212 10*3/uL (ref 150–400)
RBC: 3.79 MIL/uL — ABNORMAL LOW (ref 4.22–5.81)
RDW: 15.7 % — ABNORMAL HIGH (ref 11.5–15.5)
WBC: 5.9 10*3/uL (ref 4.0–10.5)
nRBC: 0 % (ref 0.0–0.2)

## 2023-05-16 LAB — TROPONIN I (HIGH SENSITIVITY)
Troponin I (High Sensitivity): 5 ng/L (ref <18)
Troponin I (High Sensitivity): 4 ng/L (ref <18)

## 2023-05-16 NOTE — ED Provider Notes (Signed)
  Physical Exam  BP 131/89 (BP Location: Left Arm)   Pulse 87   Resp 18   SpO2 99%   Physical Exam Vitals and nursing note reviewed.  Constitutional:      Appearance: Normal appearance.  HENT:     Head: Normocephalic and atraumatic.     Mouth/Throat:     Mouth: Mucous membranes are moist.  Eyes:     Pupils: Pupils are equal, round, and reactive to light.  Cardiovascular:     Rate and Rhythm: Normal rate.  Pulmonary:     Effort: Pulmonary effort is normal.  Abdominal:     General: Abdomen is flat.  Musculoskeletal:     Cervical back: Normal range of motion and neck supple.  Skin:    General: Skin is warm and dry.  Neurological:     Mental Status: He is alert and oriented to person, place, and time.     Procedures  Procedures  ED Course / MDM    Medical Decision Making Amount and/or Complexity of Data Reviewed Labs: ordered. Radiology: ordered.   Patient care assumed Donetta Potts. PA at shift change, please see her note for a full HPI. Briefly, patient here with chest pain which began at 11am this morning, currently on rehab. Negative workup thus far, will need delta trop.  Troponin is negative, patient discharge from the ED in stable condition.    Portions of this note were generated with Scientist, clinical (histocompatibility and immunogenetics). Dictation errors may occur despite best attempts at proofreading.     Claude Manges, PA-C 05/16/23 1546    Rolan Bucco, MD 05/16/23 [phone]

## 2023-05-16 NOTE — ED Provider Notes (Signed)
[place] EMERGENCY DEPARTMENT AT [facility] Provider Note   CSN: [id number] Arrival date & time: 05/16/23  1227     History  No chief complaint on file.   Randy Combs is a 57 y.o. male.  Patient with history of hypertension, diabetes, gout presents today with complaints of chest pain. Patient is currently at [facility] recovery center for alcohol dependence, he arrived there yesterday. States that he was talking to one of his counselors around 11 am today and noticed that he was having some substernal chest pain. The pain did not radiate and he denies any diaphoresis, shortness of breath, nausea, or vomiting. The pain lasted about 2-3 minutes and then spontaneously resolved without intervention and has not returned. His counselor got an EKG and was concerned that it was abnormal and recommended he come here for evaluation of same. He denies any history of similar symptoms previously. Denies any cardiac history. He does dip but does not smoke cigarettes. He denies any recreational drug use. No leg pain, leg swelling, recent travel, or recent surgeries.  The history is provided by the patient. No language interpreter was used.       Home Medications Prior to Admission medications   Medication Sig Start Date End Date Taking? Authorizing Provider  amLODipine (NORVASC) 10 MG tablet TAKE 1 TABLET BY MOUTH EVERY DAY 04/23/23   Copland, Karleen Hampshire, MD  carvedilol (COREG) 25 MG tablet Take 1 tablet (25 mg total) by mouth 2 (two) times daily with a meal. 07/20/22   Meredeth Ide, MD  colchicine 0.6 MG tablet Take 1 tablet (0.6 mg total) by mouth 2 (two) times daily as needed (Gout). 07/31/22   Copland, Karleen Hampshire, MD  HYDROcodone-acetaminophen (NORCO/VICODIN) 5-325 MG tablet Take 1 tablet by mouth every 4 (four) hours as needed for moderate pain. 04/22/23 04/21/24  Cuthriell, Delorise Royals, PA-C  indomethacin (INDOCIN) 50 MG capsule TAKE 1 CAPSULE (50 MG TOTAL) BY MOUTH 3 (THREE) TIMES  DAILY AS NEEDED FOR MODERATE PAIN. 04/23/23   Copland, Karleen Hampshire, MD  ketorolac (TORADOL) 10 MG tablet Take 1 tablet (10 mg total) by mouth every 6 (six) hours as needed. 04/22/23   Cuthriell, Delorise Royals, PA-C  naltrexone (DEPADE) 50 MG tablet Take 1 tablet (50 mg total) by mouth daily. 12/25/22   Copland, Karleen Hampshire, MD  traZODone (DESYREL) 50 MG tablet TAKE 1 TO 2 TABLETS BY MOUTH AT BEDTIME AS NEEDED FOR SLEEP 04/23/23   Copland, Karleen Hampshire, MD      Allergies    Patient has no active allergies.    Review of Systems   Review of Systems  Cardiovascular:  Positive for chest pain (resolved).  All other systems reviewed and are negative.   Physical Exam Updated Vital Signs BP 131/89 (BP Location: Left Arm)   Pulse 87   Resp 18   SpO2 99%  Physical Exam Vitals and nursing note reviewed.  Constitutional:      General: He is not in acute distress.    Appearance: Normal appearance. He is normal weight. He is not ill-appearing, toxic-appearing or diaphoretic.  HENT:     Head: Normocephalic and atraumatic.  Cardiovascular:     Rate and Rhythm: Normal rate and regular rhythm.     Pulses:          Dorsalis pedis pulses are 2+ on the right side and 2+ on the left side.       Posterior tibial pulses are 2+ on the right side and 2+ on  the left side.     Heart sounds: Normal heart sounds.  Pulmonary:     Effort: Pulmonary effort is normal. No respiratory distress.     Breath sounds: Normal breath sounds.  Chest:     Chest wall: No tenderness.  Abdominal:     General: Abdomen is flat.     Palpations: Abdomen is soft.     Tenderness: There is no abdominal tenderness.  Musculoskeletal:        General: Normal range of motion.     Cervical back: Normal range of motion.     Right lower leg: No edema.     Left lower leg: No edema.  Skin:    General: Skin is warm and dry.  Neurological:     General: No focal deficit present.     Mental Status: He is alert.  Psychiatric:        Mood and Affect:  Mood normal.        Behavior: Behavior normal.     ED Results / Procedures / Treatments   Labs (all labs ordered are listed, but only abnormal results are displayed) Labs Reviewed  BASIC METABOLIC PANEL - Abnormal; Notable for the following components:      Result Value   Chloride 97 (*)    Glucose, Bld 101 (*)    Creatinine, Ser 1.34 (*)    Anion gap 16 (*)    All other components within normal limits  CBC - Abnormal; Notable for the following components:   RBC 3.79 (*)    HCT 38.6 (*)    MCV 101.8 (*)    MCH 35.4 (*)    RDW 15.7 (*)    All other components within normal limits  TROPONIN I (HIGH SENSITIVITY)  TROPONIN I (HIGH SENSITIVITY)    EKG None  Radiology DG Chest 2 View  Result Date: 05/16/2023 CLINICAL DATA:  Chest pain EXAM: CHEST - 2 VIEW COMPARISON:  X-ray 07/14/2022 FINDINGS: No consolidation, pneumothorax or effusion. No edema. Normal cardiopericardial silhouette. Degenerative changes seen along the spine. There are some loops of bowel in the upper abdomen which are distended with some air-fluid levels. Please correlate with symptoms. Overlapping artifact along the anterior midthorax. IMPRESSION: No acute cardiopulmonary disease. Air-fluid levels along several loops of bowel beneath the left hemidiaphragm. Electronically Signed   By: Karen Kays M.D.   On: 05/16/2023 14:16    Procedures Procedures    Medications Ordered in ED Medications - No data to display  ED Course/ Medical Decision Making/ A&P                             Medical Decision Making Amount and/or Complexity of Data Reviewed Labs: ordered. Radiology: ordered.   This patient is a 57 y.o. male who presents to the ED for concern of chest pain, this involves an extensive number of treatment options, and is a complaint that carries with it a high risk of complications and morbidity. The emergent differential diagnosis prior to evaluation includes, but is not limited to,  ACS, pericarditis,  myocarditis, aortic dissection, PE, pneumothorax, esophageal spasm or rupture, chronic angina, pneumonia, bronchitis, GERD, reflux/PUD, biliary disease, pancreatitis, costochondritis, anxiety  This is not an exhaustive differential.   Past Medical History / Co-morbidities / Social History:  history of hypertension, diabetes, gout   Additional history: Chart reviewed. Pertinent results include: patient currently in rehab for alcohol dependence  Physical Exam: Physical exam performed.  The pertinent findings include: Per above, no pertinent physical exam findings  Lab Tests: I ordered, and personally interpreted labs.  The pertinent results include:  creatinine 1.34, previous 1.29, anion gap 16, glucose 101. Troponin 5   Imaging Studies: I ordered imaging studies including CXR. I independently visualized and interpreted imaging which showed  No acute cardiopulmonary disease.   Air-fluid levels along several loops of bowel beneath the left hemidiaphragm.   I agree with the radiologist interpretation.  On exam, patients abdomen is soft and nontender and he is not nausea or vomiting no concern for obstruction or other acute intra-abdominal process.   Cardiac Monitoring:  The patient was maintained on a cardiac monitor.  My attending physician Dr. Rodena Medin viewed and interpreted the cardiac monitored which showed an underlying rhythm of: NSR, RBBB unchanged from previous. I agree with this interpretation. See media tab for scan of EKG  Disposition:  Patient presents today with isolated episode of chest pain that lasted 2-3 minutes and then spontaneously resolved. HEART score 3, low risk. Work-up reassuring, pending delta troponin given pain occurred at 11 am. First trop was 5, if second is normal suspect patient will be stable for discharge back to rehab with close outpatient follow-up. Patient is understanding and in agreement with plan.  Care handoff to [facility], PA-C at shift change.  Please see their note for continued evaluation and dispo.  Final Clinical Impression(s) / ED Diagnoses Final diagnoses:  Precordial chest pain    Rx / DC Orders ED Discharge Orders     None         Vear Clock 05/17/23 1846    Wynetta Fines, MD 05/25/23 [phone]

## 2023-05-16 NOTE — Discharge Instructions (Signed)
You were seen in the emergency department today for chest pain.  As we discussed your lab work, EKG, chest x-ray all looked reassuring today. Given that you do not have any history of heart problems and your pain has resolved, there is no indication for additional evaluation today  I recommend monitoring your stress levels.  Continue to monitor how you are doing overall, and return to the emergency department for any new or worsening symptoms such as: Worsening pain or pain with exertion, difficulty breathing, sweating, or pain or swelling in your legs.  Please schedule a follow-up appointment with your primary care doctor at your earliest convenience.

## 2023-05-16 NOTE — ED Triage Notes (Signed)
Pt BIB [facility] from [facility] for CP.  Pt had 5 min episode of CP while talking to PA today. EKG showed RBB and some T wave inversion.  Pt is pain free on arrival. PA at [facility] wanted to have pt further evaluated.    PT began detoxing at home on Sunday, began detox at [facility] yesterday.  160/80 94% RA HR 80

## 2023-05-16 NOTE — ED Notes (Signed)
Patient returning to [facility] to detox. [facility] called and sending transportation for patient.

## 2023-05-18 ENCOUNTER — Telehealth: Payer: Self-pay

## 2023-05-18 NOTE — Transitions of Care (Post Inpatient/ED Visit) (Signed)
Unable to reach pt by phone and left v/m requesting pt to call [phone].      05/18/2023  Name: BRADAN COOPERSMITH MRN: [id number] DOB: 1966-12-02  Today's TOC FU Call Status: Today's TOC FU Call Status:: Unsuccessul Call (1st Attempt) Unsuccessful Call (1st Attempt) Date: 05/18/23  Attempted to reach the patient regarding the most recent Inpatient/ED visit.  Follow Up Plan: Additional outreach attempts will be made to reach the patient to complete the Transitions of Care (Post Inpatient/ED visit) call.   Signature  Lewanda Rife, LPN

## 2023-11-22 ENCOUNTER — Ambulatory Visit (INDEPENDENT_AMBULATORY_CARE_PROVIDER_SITE_OTHER): Payer: 59 | Admitting: Nurse Practitioner

## 2023-11-22 VITALS — BP 118/70 | HR 102 | Temp 98.4°F | Ht 70.0 in | Wt 260.4 lb

## 2023-11-22 DIAGNOSIS — J029 Acute pharyngitis, unspecified: Secondary | ICD-10-CM | POA: Diagnosis not present

## 2023-11-22 LAB — POCT RAPID STREP A (OFFICE): Rapid Strep A Screen: NEGATIVE

## 2023-11-22 MED ORDER — FLUTICASONE PROPIONATE 50 MCG/ACT NA SUSP
2.0000 | Freq: Every day | NASAL | 0 refills | Status: AC
Start: 2023-11-22 — End: ?

## 2023-11-22 MED ORDER — OMEPRAZOLE 20 MG PO CPDR
20.0000 mg | DELAYED_RELEASE_CAPSULE | Freq: Every day | ORAL | 0 refills | Status: AC
Start: 2023-11-22 — End: ?

## 2023-11-22 NOTE — Assessment & Plan Note (Signed)
Strep test in office which was negative.  Will treat patient with fluticasone 2 sprays each nostril daily along with omeprazole 20 mg daily.  Patient has a follow-up with PCP within less than a month he will reevaluate this with him to see if treatment has been beneficial at that juncture.

## 2023-11-22 NOTE — Progress Notes (Signed)
Acute Office Visit  Subjective:     Patient ID: Randy Combs, male    DOB: 1966-09-26, 57 y.o.   MRN: [id number]  Chief Complaint  Patient presents with   Sore Throat    Ongoing for 6 months     Patient is in today for sore throat with a history of hypertension, ileus, uncontrolled type 2 diabetes, alcoholism  States that it has been 6 months since it started. States that  it is worse when he turns his head. States that lymphnodes are swollen. States that he will try cough drops that will help. States that he does thake indomethiin for gout. He will take it daily   Patient was concerned because he has a friend who had a sore throat for many months was diagnosed with cancer on the back of his tongue.  Review of Systems  Constitutional:  Negative for chills and fever.  HENT:  Positive for sore throat.   Respiratory:  Positive for cough and sputum production.   Gastrointestinal:  Negative for abdominal pain, constipation, diarrhea, nausea and vomiting.       BM daily   Neurological:  Negative for headaches.        Objective:    BP 118/70   Pulse (!) 102   Temp 98.4 F (36.9 C) (Oral)   Ht 5\' 10"  (1.778 m)   Wt 260 lb 6.4 oz (118.1 kg)   SpO2 94%   BMI 37.36 kg/m    Physical Exam Vitals and nursing note reviewed.  Constitutional:      Appearance: Normal appearance.  HENT:     Right Ear: Tympanic membrane, ear canal and external ear normal.     Left Ear: Tympanic membrane, ear canal and external ear normal.     Mouth/Throat:     Mouth: Mucous membranes are moist.     Pharynx: No posterior oropharyngeal erythema.  Cardiovascular:     Rate and Rhythm: Normal rate and regular rhythm.     Heart sounds: Normal heart sounds.  Pulmonary:     Effort: Pulmonary effort is normal.     Breath sounds: Normal breath sounds.  Lymphadenopathy:     Cervical: Cervical adenopathy present.  Neurological:     Mental Status: He is alert.     Results for orders placed or  performed in visit on 11/22/23  Rapid Strep A  Result Value Ref Range   Rapid Strep A Screen Negative Negative        Assessment & Plan:   Problem List Items Addressed This Visit       Other   Sore throat - Primary   Strep test in office which was negative.  Will treat patient with fluticasone 2 sprays each nostril daily along with omeprazole 20 mg daily.  Patient has a follow-up with PCP within less than a month he will reevaluate this with him to see if treatment has been beneficial at that juncture.      Relevant Medications   omeprazole (PRILOSEC) 20 MG capsule   fluticasone (FLONASE) 50 MCG/ACT nasal spray   Other Relevant Orders   Rapid Strep A (Completed)   CBC with Differential/Platelet    Meds ordered this encounter  Medications   omeprazole (PRILOSEC) 20 MG capsule    Sig: Take 1 capsule (20 mg total) by mouth daily.    Dispense:  90 capsule    Refill:  0    Supervising Provider:   Roxy Manns A [[id number]]  fluticasone (FLONASE) 50 MCG/ACT nasal spray    Sig: Place 2 sprays into both nostrils daily.    Dispense:  16 g    Refill:  0    Supervising Provider:   Roxy Manns A [[id number]]    Return if symptoms worsen or fail to improve, for As scheduled with Dr. Patsy Lager .  Audria Nine, NP

## 2023-11-22 NOTE — Patient Instructions (Signed)
Nice to see you today I have sent two medications to the pharmacy  Follow up with Dr. Patsy Lager as scheduled  I will be in touch with the results of the blood test once I have reviewed them

## 2023-11-23 LAB — CBC WITH DIFFERENTIAL/PLATELET
Basophils Absolute: 0.1 10*3/uL (ref 0.0–0.1)
Basophils Relative: 1.1 % (ref 0.0–3.0)
Eosinophils Absolute: 0.1 10*3/uL (ref 0.0–0.7)
Eosinophils Relative: 0.9 % (ref 0.0–5.0)
HCT: 44.7 % (ref 39.0–52.0)
Hemoglobin: 15.2 g/dL (ref 13.0–17.0)
Lymphocytes Relative: 15.7 % (ref 12.0–46.0)
Lymphs Abs: 1.4 10*3/uL (ref 0.7–4.0)
MCHC: 33.9 g/dL (ref 30.0–36.0)
MCV: 103.6 fL — ABNORMAL HIGH (ref 78.0–100.0)
Monocytes Absolute: 0.6 10*3/uL (ref 0.1–1.0)
Monocytes Relative: 6.8 % (ref 3.0–12.0)
Neutro Abs: 6.9 10*3/uL (ref 1.4–7.7)
Neutrophils Relative %: 75.5 % (ref 43.0–77.0)
Platelets: 305 10*3/uL (ref 150.0–400.0)
RBC: 4.32 Mil/uL (ref 4.22–5.81)
RDW: 15.6 % — ABNORMAL HIGH (ref 11.5–15.5)
WBC: 9.1 10*3/uL (ref 4.0–10.5)

## 2023-11-26 ENCOUNTER — Telehealth: Payer: Self-pay | Admitting: *Deleted

## 2023-11-26 DIAGNOSIS — Z1159 Encounter for screening for other viral diseases: Secondary | ICD-10-CM

## 2023-11-26 DIAGNOSIS — M1A09X1 Idiopathic chronic gout, multiple sites, with tophus (tophi): Secondary | ICD-10-CM

## 2023-11-26 DIAGNOSIS — D508 Other iron deficiency anemias: Secondary | ICD-10-CM

## 2023-11-26 DIAGNOSIS — E1165 Type 2 diabetes mellitus with hyperglycemia: Secondary | ICD-10-CM

## 2023-11-26 DIAGNOSIS — Z125 Encounter for screening for malignant neoplasm of prostate: Secondary | ICD-10-CM

## 2023-11-26 DIAGNOSIS — Z114 Encounter for screening for human immunodeficiency virus [HIV]: Secondary | ICD-10-CM

## 2023-11-26 DIAGNOSIS — I10 Essential (primary) hypertension: Secondary | ICD-10-CM

## 2023-11-26 NOTE — Telephone Encounter (Signed)
-----   Message from Vincenza Hews sent at 11/23/2023  3:22 PM EST ----- Regarding: Labs Thurs Jan 2 Hello,  Patient is coming in for CPE labs on Thursday Dec 06 2023. Can we get orders please.   Thanks

## 2023-12-06 ENCOUNTER — Other Ambulatory Visit: Payer: 59

## 2023-12-06 DIAGNOSIS — Z114 Encounter for screening for human immunodeficiency virus [HIV]: Secondary | ICD-10-CM

## 2023-12-06 DIAGNOSIS — M1A09X1 Idiopathic chronic gout, multiple sites, with tophus (tophi): Secondary | ICD-10-CM

## 2023-12-06 DIAGNOSIS — D508 Other iron deficiency anemias: Secondary | ICD-10-CM

## 2023-12-06 DIAGNOSIS — Z1159 Encounter for screening for other viral diseases: Secondary | ICD-10-CM

## 2023-12-06 DIAGNOSIS — Z125 Encounter for screening for malignant neoplasm of prostate: Secondary | ICD-10-CM

## 2023-12-06 DIAGNOSIS — E1165 Type 2 diabetes mellitus with hyperglycemia: Secondary | ICD-10-CM

## 2023-12-06 DIAGNOSIS — I10 Essential (primary) hypertension: Secondary | ICD-10-CM | POA: Diagnosis not present

## 2023-12-06 LAB — MICROALBUMIN / CREATININE URINE RATIO
Creatinine,U: 258.6 mg/dL
Microalb Creat Ratio: 2 mg/g (ref 0.0–30.0)
Microalb, Ur: 5.2 mg/dL — ABNORMAL HIGH (ref 0.0–1.9)

## 2023-12-06 LAB — LIPID PANEL
Cholesterol: 222 mg/dL — ABNORMAL HIGH (ref 0–200)
HDL: 62.9 mg/dL (ref >39.00)
LDL Cholesterol: 137 mg/dL — ABNORMAL HIGH (ref 0–99)
NonHDL: 159.59
Total CHOL/HDL Ratio: 4
Triglycerides: 111 mg/dL (ref 0.0–149.0)
VLDL: 22.2 mg/dL (ref 0.0–40.0)

## 2023-12-06 LAB — BASIC METABOLIC PANEL
BUN: 17 mg/dL (ref 6–23)
CO2: 29 meq/L (ref 19–32)
Calcium: 9.9 mg/dL (ref 8.4–10.5)
Chloride: 97 meq/L (ref 96–112)
Creatinine, Ser: 1.31 mg/dL (ref 0.40–1.50)
GFR: 60.75 mL/min (ref >60.00)
Glucose, Bld: 75 mg/dL (ref 70–99)
Potassium: 4.8 meq/L (ref 3.5–5.1)
Sodium: 137 meq/L (ref 135–145)

## 2023-12-06 LAB — CBC WITH DIFFERENTIAL/PLATELET
Basophils Absolute: 0.1 10*3/uL (ref 0.0–0.1)
Basophils Relative: 0.9 % (ref 0.0–3.0)
Eosinophils Absolute: 0.1 10*3/uL (ref 0.0–0.7)
Eosinophils Relative: 0.9 % (ref 0.0–5.0)
HCT: 48.6 % (ref 39.0–52.0)
Hemoglobin: 16.3 g/dL (ref 13.0–17.0)
Lymphocytes Relative: 10.7 % — ABNORMAL LOW (ref 12.0–46.0)
Lymphs Abs: 1 10*3/uL (ref 0.7–4.0)
MCHC: 33.5 g/dL (ref 30.0–36.0)
MCV: 103.9 fL — ABNORMAL HIGH (ref 78.0–100.0)
Monocytes Absolute: 0.9 10*3/uL (ref 0.1–1.0)
Monocytes Relative: 9.2 % (ref 3.0–12.0)
Neutro Abs: 7.6 10*3/uL (ref 1.4–7.7)
Neutrophils Relative %: 78.3 % — ABNORMAL HIGH (ref 43.0–77.0)
Platelets: 339 10*3/uL (ref 150.0–400.0)
RBC: 4.67 Mil/uL (ref 4.22–5.81)
RDW: 14.9 % (ref 11.5–15.5)
WBC: 9.7 10*3/uL (ref 4.0–10.5)

## 2023-12-06 LAB — HEPATIC FUNCTION PANEL
ALT: 19 U/L (ref 0–53)
AST: 20 U/L (ref 0–37)
Albumin: 4.6 g/dL (ref 3.5–5.2)
Alkaline Phosphatase: 131 U/L — ABNORMAL HIGH (ref 39–117)
Bilirubin, Direct: 0.2 mg/dL (ref 0.0–0.3)
Total Bilirubin: 0.9 mg/dL (ref 0.2–1.2)
Total Protein: 8 g/dL (ref 6.0–8.3)

## 2023-12-06 LAB — IBC + FERRITIN
Ferritin: 106 ng/mL (ref 22.0–322.0)
Iron: 250 ug/dL — ABNORMAL HIGH (ref 42–165)
Saturation Ratios: 74.4 % — ABNORMAL HIGH (ref 20.0–50.0)
TIBC: 336 ug/dL (ref 250.0–450.0)
Transferrin: 240 mg/dL (ref 212.0–360.0)

## 2023-12-06 LAB — HEMOGLOBIN A1C: Hgb A1c MFr Bld: 5.8 % (ref 4.6–6.5)

## 2023-12-06 LAB — URIC ACID: Uric Acid, Serum: 10 mg/dL — ABNORMAL HIGH (ref 4.0–7.8)

## 2023-12-10 LAB — PSA, TOTAL WITH REFLEX TO PSA, FREE: PSA, Total: 1.3 ng/mL (ref <=4.0)

## 2023-12-10 LAB — HEPATITIS C ANTIBODY: Hepatitis C Ab: NONREACTIVE

## 2023-12-10 LAB — HIV ANTIBODY (ROUTINE TESTING W REFLEX): HIV 1&2 Ab, 4th Generation: NONREACTIVE

## 2023-12-13 ENCOUNTER — Encounter: Payer: 59 | Admitting: Family Medicine

## 2023-12-13 ENCOUNTER — Encounter: Payer: Self-pay | Admitting: Family Medicine

## 2024-08-08 ENCOUNTER — Ambulatory Visit: Admitting: Family

## 2024-08-10 DIAGNOSIS — F1020 Alcohol dependence, uncomplicated: Secondary | ICD-10-CM | POA: Insufficient documentation

## 2024-08-10 NOTE — Progress Notes (Signed)
 "    Randy Lafontaine T. Anayansi Rundquist, MD, CAQ Sports Medicine [facility] at [facility] [address] [place], [address]  Phone: [phone]  FAX: [phone]  Randy Combs - 58 y.o. male  MRN [id number]  Date of Birth: 1966/03/18  Date: 08/11/2024  PCP: Watt Mirza, MD  Referral: Watt Mirza, MD  Chief Complaint  Patient presents with   Wrist Pain    Right wrist pain/swelling.  Knot on left hand.    Subjective:   Randy Combs is a 58 y.o. very pleasant male patient with Body mass index is 39.31 kg/m. who presents with the following:  Discussed the use of AI scribe software for clinical note transcription with the patient, who gave verbal consent to proceed.  Patient presents with pain, arthritis, gout pain of the right wrist.  He also has multiple medical problems and has not had a physical in extended period of time, last saw him in January 2024 for chronic alcoholism.  He also has a history of diabetes and hypertension. History of Present Illness Randy Combs is a 58 year old male with gout who presents with a swollen and painful hand.  On the right side.  He has experienced swelling and pain in his hand, particularly affecting the middle finger, for approximately three months. The pain is severe, akin to a toothache, and while the swelling has decreased, the area remains tender. He received a steroid injection in the wrist at urgent care, which provided some relief. The symptoms impact his ability to operate equipment at work, as he has difficulty making a fist and performing tasks due to pain and swelling.  He has a long-standing history of gout, refractory to treatments including allopurinol , colchicine , and indomethacin , as well as Uloric . His gout flares have been severe, with the most recent flare in June, treated with prednisone . He has not been on any gout medication since completing a rehab program for alcohol in July of the  previous year.  He completed a 30-day rehab program for alcohol use disorder in July of the previous year. He currently drinks a few beers occasionally but was previously consuming a fifth of liquor daily, which contributed to his divorce from his wife.  He has a history of hypertension and was previously on multiple blood pressure medications, including amlodipine  and carvedilol , which he discontinued after rehab. He expresses a need to restart them. He also reports intermittent throat irritation, for which he was prescribed Flonase , providing some relief.  No recent trauma to the hand.    Review of Systems is noted in the HPI, as appropriate  Objective:   BP (!) 158/96   Pulse (!) 109   Temp (!) 96.6 F (35.9 C) (Temporal)   Ht 5' 10 (1.778 m)   Wt 274 lb (124.3 kg)   SpO2 97%   BMI 39.31 kg/m   GEN: No acute distress; alert,appropriate. PULM: Breathing comfortably in no respiratory distress PSYCH: Normally interactive.   Hands and wrist are swollen bilaterally with significant wrist effusions bilaterally Marked restriction of motion with motion at the wrist bilaterally He also has fairly profound swelling in and about the MCPs, PIPs, DIP joints  Physical Exam   Laboratory and Imaging Data:  Assessment and Plan:     ICD-10-CM   1. Idiopathic chronic gout, multiple sites, without tophus (tophi)  M1A.90K9 Ambulatory referral to Rheumatology    2. Acute pain of right wrist  M25.531     3. Uncontrolled  type 2 diabetes mellitus with hyperglycemia, without long-term current use of insulin  (HCC)  E11.65     4. Essential hypertension  I10      Assessment & Plan Chronic, severe gout with recurrent flares involving right wrist and hand Significant swelling and pain. Previous treatments ineffective. Alcohol may exacerbate symptoms. Indomethacin  use limited due to bleeding risk. - Prescribe higher dose of steroids for longer period. - Prescribe colchicine  twice daily. -  Advise against indomethacin  until off steroids. - Refer to rheumatology for further evaluation and management -hopefully they will have some ideas - Discuss potential new medications with rheumatology.  Alcohol use disorder, currently drinking Severe alcohol use disorder with recent reduction to occasional beer. Alcohol may contribute to gout flares and elevated liver function tests. - Discuss impact of alcohol on gout and overall health. - Encourage reduction or cessation of alcohol consumption.  With his level of alcohol abuse and addiction, I am doubtful he will be able to maintain drinking only a few beers a day.  Hypertension Hypertension previously managed with multiple medications, currently untreated. Blood pressure management crucial to prevent complications. - Start amlodipine  at half a tablet daily for one week, then increase to full tablet daily. - Schedule follow-up appointment to monitor blood pressure and adjust treatment as necessary.  Follow-Up Follow-up necessary to monitor treatment effectiveness and adjust as needed. - Schedule full physical examination in one month. - If no contact from rheumatology by Friday, advise to call or send message.  Medication Management during today's office visit: Meds ordered this encounter  Medications   predniSONE  (DELTASONE ) 20 MG tablet    Sig: 2 tabs po for 7 days, then 1 tab po for 7 days    Dispense:  21 tablet    Refill:  0   colchicine  0.6 MG tablet    Sig: Take 1 tablet (0.6 mg total) by mouth 2 (two) times daily.    Dispense:  60 tablet    Refill:  2   indomethacin  (INDOCIN ) 50 MG capsule    Sig: Take 1 capsule (50 mg total) by mouth 3 (three) times daily with meals.    Dispense:  90 capsule    Refill:  3   amLODipine  (NORVASC ) 10 MG tablet    Sig: Take 1 tablet (10 mg total) by mouth daily.    Dispense:  90 tablet    Refill:  1   There are no discontinued medications.  Orders placed today for conditions  managed today: Orders Placed This Encounter  Procedures   Ambulatory referral to Rheumatology    Disposition: No follow-ups on file.  [employer] speech-to-text software was used for transcription in this dictation.  Possible transcriptional errors can occur using Animal nutritionist.   Signed,  Jacques DASEN. Sheccid Lahmann, MD   Outpatient Encounter Medications as of 08/11/2024  Medication Sig   amLODipine  (NORVASC ) 10 MG tablet Take 1 tablet (10 mg total) by mouth daily.   colchicine  0.6 MG tablet Take 1 tablet (0.6 mg total) by mouth 2 (two) times daily.   indomethacin  (INDOCIN ) 50 MG capsule Take 1 capsule (50 mg total) by mouth 3 (three) times daily with meals.   predniSONE  (DELTASONE ) 20 MG tablet 2 tabs po for 7 days, then 1 tab po for 7 days   fluticasone  (FLONASE ) 50 MCG/ACT nasal spray Place 2 sprays into both nostrils daily. (Patient not taking: Reported on 08/11/2024)   omeprazole  (PRILOSEC) 20 MG capsule Take 1 capsule (20 mg total) by mouth  daily. (Patient not taking: Reported on 08/11/2024)   No facility-administered encounter medications on file as of 08/11/2024.   "

## 2024-08-11 ENCOUNTER — Encounter: Payer: Self-pay | Admitting: Family Medicine

## 2024-08-11 ENCOUNTER — Ambulatory Visit: Admitting: Family Medicine

## 2024-08-11 VITALS — BP 158/96 | HR 109 | Temp 96.6°F | Ht 70.0 in | Wt 274.0 lb

## 2024-08-11 DIAGNOSIS — I10 Essential (primary) hypertension: Secondary | ICD-10-CM

## 2024-08-11 DIAGNOSIS — M25531 Pain in right wrist: Secondary | ICD-10-CM

## 2024-08-11 DIAGNOSIS — F1020 Alcohol dependence, uncomplicated: Secondary | ICD-10-CM | POA: Diagnosis not present

## 2024-08-11 DIAGNOSIS — M1A09X0 Idiopathic chronic gout, multiple sites, without tophus (tophi): Secondary | ICD-10-CM | POA: Diagnosis not present

## 2024-08-11 DIAGNOSIS — E1165 Type 2 diabetes mellitus with hyperglycemia: Secondary | ICD-10-CM

## 2024-08-11 MED ORDER — INDOMETHACIN 50 MG PO CAPS: 50.0000 mg | ORAL_CAPSULE | Freq: Three times a day (TID) | ORAL | 3 refills | Status: DC

## 2024-08-11 MED ORDER — COLCHICINE 0.6 MG PO TABS: 0.6000 mg | ORAL_TABLET | Freq: Two times a day (BID) | ORAL | 2 refills | Status: DC

## 2024-08-11 MED ORDER — AMLODIPINE BESYLATE 10 MG PO TABS: 10.0000 mg | ORAL_TABLET | Freq: Every day | ORAL | 1 refills | Status: AC

## 2024-08-11 MED ORDER — PREDNISONE 20 MG PO TABS: ORAL_TABLET | ORAL | 0 refills | Status: DC

## 2024-08-11 NOTE — Patient Instructions (Signed)
 Amlodipine  - take 1/2 tablet every day for 1 week, then start a full tablet

## 2024-09-05 ENCOUNTER — Encounter: Payer: Self-pay | Admitting: Family Medicine

## 2024-10-01 NOTE — Telephone Encounter (Signed)
 Pt scheduled 10/20/24 with [facility], Dr Tobie.   Nothing further needed.

## 2024-11-05 ENCOUNTER — Other Ambulatory Visit: Payer: Self-pay | Admitting: Family Medicine

## 2024-11-05 ENCOUNTER — Telehealth: Payer: Self-pay | Admitting: *Deleted

## 2024-11-05 DIAGNOSIS — M1A09X1 Idiopathic chronic gout, multiple sites, with tophus (tophi): Secondary | ICD-10-CM

## 2024-11-05 DIAGNOSIS — E1165 Type 2 diabetes mellitus with hyperglycemia: Secondary | ICD-10-CM

## 2024-11-05 DIAGNOSIS — Z114 Encounter for screening for human immunodeficiency virus [HIV]: Secondary | ICD-10-CM

## 2024-11-05 DIAGNOSIS — D508 Other iron deficiency anemias: Secondary | ICD-10-CM

## 2024-11-05 DIAGNOSIS — Z1159 Encounter for screening for other viral diseases: Secondary | ICD-10-CM

## 2024-11-05 DIAGNOSIS — Z125 Encounter for screening for malignant neoplasm of prostate: Secondary | ICD-10-CM

## 2024-11-05 DIAGNOSIS — I10 Essential (primary) hypertension: Secondary | ICD-10-CM

## 2024-11-05 NOTE — Addendum Note (Signed)
 Addended by: HOPE VEVA PARAS on: 11/05/2024 03:36 PM   Modules accepted: Orders

## 2024-11-05 NOTE — Telephone Encounter (Signed)
 I spoked with patient and he has been scheduled

## 2024-11-05 NOTE — Telephone Encounter (Signed)
-----   Message from Veva JINNY Ferrari sent at 11/05/2024 11:38 AM EST ----- Regarding: Lab orders for Tue, 12.16.25 Patient is scheduled for CPX labs, please order future labs, Thanks , Veva

## 2024-11-05 NOTE — Telephone Encounter (Signed)
 Please try and schedule CPE with fasting labs prior for Dr. Watt.

## 2024-11-18 ENCOUNTER — Other Ambulatory Visit

## 2024-11-18 DIAGNOSIS — E1165 Type 2 diabetes mellitus with hyperglycemia: Secondary | ICD-10-CM

## 2024-11-18 DIAGNOSIS — I10 Essential (primary) hypertension: Secondary | ICD-10-CM | POA: Diagnosis not present

## 2024-11-18 DIAGNOSIS — M1A09X1 Idiopathic chronic gout, multiple sites, with tophus (tophi): Secondary | ICD-10-CM | POA: Diagnosis not present

## 2024-11-18 DIAGNOSIS — D508 Other iron deficiency anemias: Secondary | ICD-10-CM | POA: Diagnosis not present

## 2024-11-18 DIAGNOSIS — Z125 Encounter for screening for malignant neoplasm of prostate: Secondary | ICD-10-CM

## 2024-11-18 LAB — HEPATIC FUNCTION PANEL
ALT: 32 U/L (ref 0–53)
AST: 32 U/L (ref 5–37)
Albumin: 4.4 g/dL (ref 3.5–5.2)
Alkaline Phosphatase: 117 U/L (ref 39–117)
Bilirubin, Direct: 0.2 mg/dL (ref 0.0–0.3)
Total Bilirubin: 1.1 mg/dL (ref 0.2–1.2)
Total Protein: 7.3 g/dL (ref 6.0–8.3)

## 2024-11-18 LAB — CBC WITH DIFFERENTIAL/PLATELET
Basophils Absolute: 0.1 K/uL (ref 0.0–0.1)
Basophils Relative: 1.2 % (ref 0.0–3.0)
Eosinophils Absolute: 0.2 K/uL (ref 0.0–0.7)
Eosinophils Relative: 2.2 % (ref 0.0–5.0)
HCT: 45.7 % (ref 39.0–52.0)
Hemoglobin: 15.9 g/dL (ref 13.0–17.0)
Lymphocytes Relative: 18.9 % (ref 12.0–46.0)
Lymphs Abs: 1.5 K/uL (ref 0.7–4.0)
MCHC: 34.8 g/dL (ref 30.0–36.0)
MCV: 103.5 fl — ABNORMAL HIGH (ref 78.0–100.0)
Monocytes Absolute: 0.8 K/uL (ref 0.1–1.0)
Monocytes Relative: 10.7 % (ref 3.0–12.0)
Neutro Abs: 5.2 K/uL (ref 1.4–7.7)
Neutrophils Relative %: 67 % (ref 43.0–77.0)
Platelets: 265 K/uL (ref 150.0–400.0)
RBC: 4.41 Mil/uL (ref 4.22–5.81)
RDW: 15.2 % (ref 11.5–15.5)
WBC: 7.7 K/uL (ref 4.0–10.5)

## 2024-11-18 LAB — MICROALBUMIN / CREATININE URINE RATIO
Creatinine,U: 254.9 mg/dL
Microalb Creat Ratio: 14.2 mg/g (ref 0.0–30.0)
Microalb, Ur: 3.6 mg/dL — ABNORMAL HIGH (ref 0.0–1.9)

## 2024-11-18 LAB — BASIC METABOLIC PANEL WITH GFR
BUN: 13 mg/dL (ref 6–23)
CO2: 28 meq/L (ref 19–32)
Calcium: 9.4 mg/dL (ref 8.4–10.5)
Chloride: 95 meq/L — ABNORMAL LOW (ref 96–112)
Creatinine, Ser: 1.3 mg/dL (ref 0.40–1.50)
GFR: 60.91 mL/min (ref >60.00)
Glucose, Bld: 127 mg/dL — ABNORMAL HIGH (ref 70–99)
Potassium: 4.5 meq/L (ref 3.5–5.1)
Sodium: 132 meq/L — ABNORMAL LOW (ref 135–145)

## 2024-11-18 LAB — IBC + FERRITIN
Ferritin: 155.3 ng/mL (ref 22.0–322.0)
Iron: 125 ug/dL (ref 42–165)
Saturation Ratios: 35.7 % (ref 20.0–50.0)
TIBC: 350 ug/dL (ref 250.0–450.0)
Transferrin: 250 mg/dL (ref 212.0–360.0)

## 2024-11-18 LAB — HEMOGLOBIN A1C: Hgb A1c MFr Bld: 5.4 % (ref 4.6–6.5)

## 2024-11-18 LAB — LIPID PANEL
Cholesterol: 185 mg/dL (ref 28–200)
HDL: 51.1 mg/dL (ref >39.00)
LDL Cholesterol: 98 mg/dL (ref 0–99)
NonHDL: 133.81
Total CHOL/HDL Ratio: 4
Triglycerides: 181 mg/dL — ABNORMAL HIGH (ref 0.0–149.0)
VLDL: 36.2 mg/dL (ref 0.0–40.0)

## 2024-11-18 LAB — URIC ACID: Uric Acid, Serum: 10.6 mg/dL — ABNORMAL HIGH (ref 4.0–7.8)

## 2024-11-20 LAB — PSA, TOTAL WITH REFLEX TO PSA, FREE: PSA, Total: 0.9 ng/mL (ref <=4.0)

## 2024-11-23 NOTE — Progress Notes (Unsigned)
 "    Randy Mathes T. Atavia Poppe, MD, CAQ Sports Medicine [facility] [address] [place], [address]  Phone: [phone]  FAX: [phone]  Randy Combs - 58 y.o. male  MRN [id number]  Date of Birth: Jan 16, 1966  Date: 11/24/2024  PCP: Watt Mirza, MD  Referral: Watt Mirza, MD  No chief complaint on file.  Patient Care Team: Watt Mirza, MD as PCP - General Gaylyn Gladis PENNER, MD (Inactive) as Consulting Physician (Gastroenterology) Dessa Reyes ORN, MD (General Surgery) Subjective:   Randy Combs is a 58 y.o. pleasant patient who presents with the following:  Discussed the use of AI scribe software for clinical note transcription with the patient, who gave verbal consent to proceed.  History of Present Illness     Preventative Health Maintenance Visit:  Health Maintenance Summary Reviewed and updated, unless pt declines services.  Tobacco History Reviewed. Alcohol: No concerns, no excessive use Exercise Habits: Some activity, rec at least 30 mins 5 times a week STD concerns: no risk or activity to increase risk Drug Use: None  Randy Combs is a very well-known patient.  He has been struggling with severe gout for years.  He also has been struggling with severe alcoholism.  He did quit drinking for more than a year.  The last time I talked to him, he was drinking again.  Foot exam Eye exam Colonoscopy Flu  Diabetes, currently not on medication, but his blood sugars are currently completely normal.  Health Maintenance  Topic Date Due   FOOT EXAM  Never done   Hepatitis B Vaccines 19-59 Average Risk (1 of 3 - 19+ 3-dose series) Never done   OPHTHALMOLOGY EXAM  04/12/2022   Colonoscopy  12/31/2023   COVID-19 Vaccine (3 - 2025-26 season) 08/04/2024   Influenza Vaccine  03/03/2025 (Originally 07/04/2024)   HEMOGLOBIN A1C  05/19/2025   Diabetic kidney evaluation - eGFR measurement  11/18/2025   Diabetic kidney  evaluation - Urine ACR  11/18/2025   DTaP/Tdap/Td (4 - Td or Tdap) 12/25/2032   Pneumococcal Vaccine: 50+ Years  Completed   Hepatitis C Screening  Completed   HIV Screening  Completed   Zoster Vaccines- Shingrix   Completed   HPV VACCINES  Aged Out   Meningococcal B Vaccine  Aged Out   Immunization History  Administered Date(s) Administered   Influenza,inj,Quad PF,6+ Mos 10/12/2017, 10/23/2019, 12/25/2022   PFIZER(Purple Top)SARS-COV-2 Vaccination 06/22/2020, 10/06/2020   PNEUMOCOCCAL CONJUGATE-20 07/04/2021   Td 12/04/2005   Tdap 07/04/2012, 12/25/2022   Zoster Recombinant(Shingrix ) 07/04/2021, 12/25/2022   Patient Active Problem List   Diagnosis Date Noted   Uncontrolled type 2 diabetes mellitus with hyperglycemia, without long-term current use of insulin  (HCC) 06/12/2021    Priority: High   Alcoholism (HCC) 08/10/2024    Priority: Medium    Idiopathic chronic gout, multiple sites, without tophus (tophi) 03/01/2015    Priority: Medium    Essential hypertension 03/04/2014    Priority: Medium    Dip use 07/05/2021    Priority: Low   Ileus, postoperative (HCC)    Hiatal hernia 03/09/2016   Anemia, iron deficiency 03/01/2015    Past Medical History:  Diagnosis Date   Alcohol abuse, in remission    quit in 2008   Arthritis    Bleeding ulcer    Blood transfusion without reported diagnosis    Glaucoma    Gout of multiple sites 03/01/2015   Hiatal hernia    Hypertension    Uncontrolled type 2  diabetes mellitus, without long-term current use of insulin  06/12/2021    Past Surgical History:  Procedure Laterality Date   APPENDECTOMY  1970's   HERNIA REPAIR     abdominal   TOTAL KNEE ARTHROPLASTY Left 07/07/2022   Procedure: LEFT TOTAL KNEE ARTHROPLASTY;  Surgeon: Vernetta Lonni GRADE, MD;  Location: [facility];  Service: Orthopedics;  Laterality: Left;    Family History  Adopted: Yes  Problem Relation Age of Onset   Lung cancer Mother    Healthy Father    Healthy  Child    Healthy Child    Colon cancer Neg Hx    Esophageal cancer Neg Hx     Social History   Social History Narrative   Grading work (runs equipment)      Married      Regular exercise      Quit alcohol;tobacco      No drugs    Past Medical History, Surgical History, Social History, Family History, Problem List, Medications, and Allergies have been reviewed and updated if relevant.  Review of Systems: Pertinent positives are listed above.  Otherwise, a full 14 point review of systems has been done in full and it is negative except where it is noted positive.  Objective:   There were no vitals taken for this visit. Ideal Body Weight:    Ideal Body Weight:   No results found.    08/11/2024    3:34 PM 12/25/2022    9:45 AM 07/04/2021    8:25 AM 10/23/2019    2:31 PM  Depression screen PHQ 2/9  Decreased Interest 0 0 0 0  Down, Depressed, Hopeless 0 0 0 0  PHQ - 2 Score 0 0 0 0     GEN: well developed, well nourished, no acute distress Eyes: conjunctiva and lids normal, PERRLA, EOMI ENT: TM clear, nares clear, oral exam WNL Neck: supple, no lymphadenopathy, no thyromegaly, no JVD Pulm: clear to auscultation and percussion, respiratory effort normal CV: regular rate and rhythm, S1-S2, no murmur, rub or gallop, no bruits, peripheral pulses normal and symmetric, no cyanosis, clubbing, edema or varicosities GI: soft, non-tender; no hepatosplenomegaly, masses; active bowel sounds all quadrants GU: deferred Lymph: no cervical, axillary or inguinal adenopathy MSK: gait normal, muscle tone and strength WNL, no joint swelling, effusions, discoloration, crepitus  SKIN: clear, good turgor, color WNL, no rashes, lesions, or ulcerations Neuro: normal mental status, normal strength, sensation, and motion Psych: alert; oriented to person, place and time, normally interactive and not anxious or depressed in appearance.  All labs reviewed with patient. Results for orders placed or  performed in visit on 11/18/24  Microalbumin / creatinine urine ratio   Collection Time: 11/18/24  7:44 AM  Result Value Ref Range   Microalb, Ur 3.6 (H) 0.0 - 1.9 mg/dL   Creatinine,U 745.0 mg/dL   Microalb Creat Ratio 14.2 0.0 - 30.0 mg/g  IBC + Ferritin   Collection Time: 11/18/24  7:44 AM  Result Value Ref Range   Iron 125 42 - 165 ug/dL   Transferrin 749.9 787.9 - 360.0 mg/dL   Saturation Ratios 64.2 20.0 - 50.0 %   Ferritin 155.3 22.0 - 322.0 ng/mL   TIBC 350.0 250.0 - 450.0 mcg/dL  Uric acid   Collection Time: 11/18/24  7:44 AM  Result Value Ref Range   Uric Acid, Serum 10.6 (H) 4.0 - 7.8 mg/dL  PSA, Total with Reflex to PSA, Free   Collection Time: 11/18/24  7:44 AM  Result Value  Ref Range   PSA, Total 0.9 < OR = 4.0 ng/mL  Basic metabolic panel   Collection Time: 11/18/24  7:44 AM  Result Value Ref Range   Sodium 132 (L) 135 - 145 mEq/L   Potassium 4.5 3.5 - 5.1 mEq/L   Chloride 95 (L) 96 - 112 mEq/L   CO2 28 19 - 32 mEq/L   Glucose, Bld 127 (H) 70 - 99 mg/dL   BUN 13 6 - 23 mg/dL   Creatinine, Ser 8.69 0.40 - 1.50 mg/dL   GFR 39.08 >39.99 mL/min   Calcium 9.4 8.4 - 10.5 mg/dL  Hepatic Function Panel   Collection Time: 11/18/24  7:44 AM  Result Value Ref Range   Total Bilirubin 1.1 0.2 - 1.2 mg/dL   Bilirubin, Direct 0.2 0.0 - 0.3 mg/dL   Alkaline Phosphatase 117 39 - 117 U/L   AST 32 5 - 37 U/L   ALT 32 0 - 53 U/L   Total Protein 7.3 6.0 - 8.3 g/dL   Albumin  4.4 3.5 - 5.2 g/dL  CBC with Differential/Platelet   Collection Time: 11/18/24  7:44 AM  Result Value Ref Range   WBC 7.7 4.0 - 10.5 K/uL   RBC 4.41 4.22 - 5.81 Mil/uL   Hemoglobin 15.9 13.0 - 17.0 g/dL   HCT 54.2 60.9 - 47.9 %   MCV 103.5 (H) 78.0 - 100.0 fl   MCHC 34.8 30.0 - 36.0 g/dL   RDW 84.7 88.4 - 84.4 %   Platelets 265.0 150.0 - 400.0 K/uL   Neutrophils Relative % 67.0 43.0 - 77.0 %   Lymphocytes Relative 18.9 12.0 - 46.0 %   Monocytes Relative 10.7 3.0 - 12.0 %   Eosinophils Relative  2.2 0.0 - 5.0 %   Basophils Relative 1.2 0.0 - 3.0 %   Neutro Abs 5.2 1.4 - 7.7 K/uL   Lymphs Abs 1.5 0.7 - 4.0 K/uL   Monocytes Absolute 0.8 0.1 - 1.0 K/uL   Eosinophils Absolute 0.2 0.0 - 0.7 K/uL   Basophils Absolute 0.1 0.0 - 0.1 K/uL  Lipid panel   Collection Time: 11/18/24  7:44 AM  Result Value Ref Range   Cholesterol 185 28 - 200 mg/dL   Triglycerides 818.9 (H) 0.0 - 149.0 mg/dL   HDL 48.89 >60.99 mg/dL   VLDL 63.7 0.0 - 59.9 mg/dL   LDL Cholesterol 98 0 - 99 mg/dL   Total CHOL/HDL Ratio 4    NonHDL 133.81   Hemoglobin A1c   Collection Time: 11/18/24  7:44 AM  Result Value Ref Range   Hgb A1c MFr Bld 5.4 4.6 - 6.5 %    Assessment and Plan:     ICD-10-CM   1. Healthcare maintenance  Z00.00      Assessment & Plan   Health Maintenance Exam: The patient's preventative maintenance and recommended screening tests for an annual wellness exam were reviewed in full today. Brought up to date unless services declined.  Counselled on the importance of diet, exercise, and its role in overall health and mortality. The patient's FH and SH was reviewed, including their home life, tobacco status, and drug and alcohol status.  Follow-up in 1 year for physical exam or additional follow-up below.  Disposition: No follow-ups on file.  No orders of the defined types were placed in this encounter.  There are no discontinued medications. No orders of the defined types were placed in this encounter.   Signed,  Jacques DASEN. Kannan Proia, MD   Allergies as of  11/24/2024   No Active Allergies      Medication List        Accurate as of November 23, 2024  8:38 AM. If you have any questions, ask your nurse or doctor.          amLODipine  10 MG tablet Commonly known as: NORVASC  Take 1 tablet (10 mg total) by mouth daily.   colchicine  0.6 MG tablet TAKE 1 TABLET BY MOUTH 2 TIMES DAILY.   fluticasone  50 MCG/ACT nasal spray Commonly known as: FLONASE  Place 2 sprays into both  nostrils daily.   indomethacin  50 MG capsule Commonly known as: INDOCIN  Take 1 capsule (50 mg total) by mouth 3 (three) times daily with meals.   omeprazole  20 MG capsule Commonly known as: PRILOSEC Take 1 capsule (20 mg total) by mouth daily.   predniSONE  20 MG tablet Commonly known as: DELTASONE  2 tabs po for 7 days, then 1 tab po for 7 days       "

## 2024-11-24 ENCOUNTER — Ambulatory Visit: Admitting: Family Medicine

## 2024-11-24 VITALS — BP 160/105 | HR 105 | Temp 97.7°F | Ht 68.5 in | Wt 264.5 lb

## 2024-11-24 DIAGNOSIS — Z0000 Encounter for general adult medical examination without abnormal findings: Secondary | ICD-10-CM | POA: Diagnosis not present

## 2024-11-24 DIAGNOSIS — Z1211 Encounter for screening for malignant neoplasm of colon: Secondary | ICD-10-CM

## 2024-11-24 DIAGNOSIS — Z23 Encounter for immunization: Secondary | ICD-10-CM

## 2024-11-24 DIAGNOSIS — L989 Disorder of the skin and subcutaneous tissue, unspecified: Secondary | ICD-10-CM

## 2024-11-24 MED ORDER — LISINOPRIL 10 MG PO TABS: 10.0000 mg | ORAL_TABLET | Freq: Every day | ORAL | 3 refills | Status: AC

## 2024-11-25 ENCOUNTER — Encounter: Payer: Self-pay | Admitting: Family Medicine

## 2024-12-05 ENCOUNTER — Other Ambulatory Visit: Payer: Self-pay | Admitting: Family Medicine

## 2024-12-15 ENCOUNTER — Ambulatory Visit: Admitting: Dermatology

## 2024-12-30 ENCOUNTER — Telehealth: Payer: Self-pay

## 2024-12-30 ENCOUNTER — Other Ambulatory Visit: Payer: Self-pay

## 2024-12-30 DIAGNOSIS — Z860100 Personal history of colon polyps, unspecified: Secondary | ICD-10-CM

## 2024-12-30 MED ORDER — GOLYTELY 236 G PO SOLR: 4000.0000 mL | Freq: Once | ORAL | 0 refills | Status: AC

## 2024-12-30 NOTE — Telephone Encounter (Signed)
 Gastroenterology Pre-Procedure Review  Request Date: 02/06/25 Requesting Physician: Dr. Theophilus  PATIENT REVIEW QUESTIONS: The patient responded to the following health history questions as indicated:    1. Are you having any GI issues? no 2. Do you have a personal history of Polyps? yes (last colonoscopy performed by Dr. Aneita at [facility]. 12/10/18 polyps were noted on colonoscopy report) 3. Do you have a family history of Colon Cancer or Polyps? no 4. Diabetes Mellitus? no 5. Joint replacements in the past 12 months?no 6. Major health problems in the past 3 months?no 7. Any artificial heart valves, MVP, or defibrillator?no    MEDICATIONS & ALLERGIES:    Patient reports the following regarding taking any anticoagulation/antiplatelet therapy:   Plavix, Coumadin, Eliquis, Xarelto, Lovenox , Pradaxa, Brilinta, or Effient? no Aspirin ? no  Patient confirms/reports the following medications:  Current Outpatient Medications  Medication Sig Dispense Refill   polyethylene glycol (GOLYTELY ) 236 g solution Take 4,000 mLs by mouth once for 1 dose. 4000 mL 0   amLODipine  (NORVASC ) 10 MG tablet Take 1 tablet (10 mg total) by mouth daily. 90 tablet 1   colchicine  0.6 MG tablet TAKE 1 TABLET BY MOUTH 2 TIMES DAILY. 60 tablet 2   indomethacin  (INDOCIN ) 50 MG capsule TAKE 1 CAPSULE BY MOUTH 3 TIMES DAILY WITH MEALS. 90 capsule 11   lisinopril  (ZESTRIL ) 10 MG tablet Take 1 tablet (10 mg total) by mouth daily. 30 tablet 3   No current facility-administered medications for this visit.    Patient confirms/reports the following allergies:  Allergies[1]  No orders of the defined types were placed in this encounter.   AUTHORIZATION INFORMATION Primary Insurance: 1D#: Group #:  Secondary Insurance: 1D#: Group #:  SCHEDULE INFORMATION: Date: 02/06/25 Time: Location: [facility]    [1] No Known Allergies

## 2025-02-06 ENCOUNTER — Ambulatory Visit: Admit: 2025-02-06
